# Patient Record
Sex: Female | Born: 1960 | Race: White | Hispanic: No | Smoking: Former smoker
Health system: Southern US, Community
[De-identification: ages and names within clinical notes are randomized; demographics above are authoritative.]

## PROBLEM LIST (undated history)

## (undated) DIAGNOSIS — K759 Inflammatory liver disease, unspecified: Secondary | ICD-10-CM

## (undated) DIAGNOSIS — R569 Unspecified convulsions: Secondary | ICD-10-CM

## (undated) DIAGNOSIS — F329 Major depressive disorder, single episode, unspecified: Secondary | ICD-10-CM

## (undated) DIAGNOSIS — K219 Gastro-esophageal reflux disease without esophagitis: Secondary | ICD-10-CM

## (undated) DIAGNOSIS — E049 Nontoxic goiter, unspecified: Secondary | ICD-10-CM

## (undated) DIAGNOSIS — E7800 Pure hypercholesterolemia, unspecified: Secondary | ICD-10-CM

## (undated) DIAGNOSIS — K649 Unspecified hemorrhoids: Secondary | ICD-10-CM

## (undated) DIAGNOSIS — M5136 Other intervertebral disc degeneration, lumbar region: Secondary | ICD-10-CM

## (undated) DIAGNOSIS — D369 Benign neoplasm, unspecified site: Secondary | ICD-10-CM

## (undated) DIAGNOSIS — B1920 Unspecified viral hepatitis C without hepatic coma: Secondary | ICD-10-CM

## (undated) DIAGNOSIS — I83893 Varicose veins of bilateral lower extremities with other complications: Secondary | ICD-10-CM

## (undated) DIAGNOSIS — R112 Nausea with vomiting, unspecified: Secondary | ICD-10-CM

## (undated) DIAGNOSIS — Z9889 Other specified postprocedural states: Secondary | ICD-10-CM

## (undated) DIAGNOSIS — R0681 Apnea, not elsewhere classified: Secondary | ICD-10-CM

## (undated) DIAGNOSIS — M51369 Other intervertebral disc degeneration, lumbar region without mention of lumbar back pain or lower extremity pain: Secondary | ICD-10-CM

## (undated) DIAGNOSIS — I10 Essential (primary) hypertension: Secondary | ICD-10-CM

## (undated) DIAGNOSIS — N390 Urinary tract infection, site not specified: Secondary | ICD-10-CM

## (undated) DIAGNOSIS — F32A Depression, unspecified: Secondary | ICD-10-CM

## (undated) DIAGNOSIS — F410 Panic disorder [episodic paroxysmal anxiety] without agoraphobia: Secondary | ICD-10-CM

## (undated) DIAGNOSIS — M25569 Pain in unspecified knee: Secondary | ICD-10-CM

## (undated) HISTORY — DX: Pain in unspecified knee: M25.569

## (undated) HISTORY — DX: Benign neoplasm, unspecified site: D36.9

## (undated) HISTORY — PX: BACK SURGERY: SHX140

## (undated) HISTORY — PX: HEMORRHOID BANDING: SHX5850

## (undated) HISTORY — DX: Varicose veins of bilateral lower extremities with other complications: I83.893

## (undated) HISTORY — DX: Unspecified hemorrhoids: K64.9

---
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
## 1986-07-24 HISTORY — PX: TUBAL LIGATION: SHX77

## 2002-07-24 DIAGNOSIS — B1920 Unspecified viral hepatitis C without hepatic coma: Secondary | ICD-10-CM

## 2002-07-24 HISTORY — DX: Unspecified viral hepatitis C without hepatic coma: B19.20

## 2003-07-25 HISTORY — PX: NECK SURGERY: SHX720

## 2006-10-25 ENCOUNTER — Encounter: Admission: RE | Admit: 2006-10-25 | Discharge: 2006-10-25 | Payer: Self-pay | Admitting: Neurosurgery

## 2007-01-23 ENCOUNTER — Ambulatory Visit (HOSPITAL_COMMUNITY): Admission: RE | Admit: 2007-01-23 | Discharge: 2007-01-23 | Payer: Self-pay | Admitting: Family Medicine

## 2007-02-25 ENCOUNTER — Ambulatory Visit (HOSPITAL_COMMUNITY): Admission: RE | Admit: 2007-02-25 | Discharge: 2007-02-25 | Payer: Self-pay | Admitting: Family Medicine

## 2007-03-07 ENCOUNTER — Encounter: Admission: RE | Admit: 2007-03-07 | Discharge: 2007-03-07 | Payer: Self-pay | Admitting: Sports Medicine

## 2007-03-21 ENCOUNTER — Encounter: Admission: RE | Admit: 2007-03-21 | Discharge: 2007-03-21 | Payer: Self-pay | Admitting: Sports Medicine

## 2007-04-04 ENCOUNTER — Encounter: Admission: RE | Admit: 2007-04-04 | Discharge: 2007-04-04 | Payer: Self-pay | Admitting: Sports Medicine

## 2007-04-25 ENCOUNTER — Encounter: Payer: Self-pay | Admitting: Sports Medicine

## 2007-05-25 ENCOUNTER — Encounter: Payer: Self-pay | Admitting: Sports Medicine

## 2008-07-21 ENCOUNTER — Ambulatory Visit (HOSPITAL_COMMUNITY): Admission: RE | Admit: 2008-07-21 | Discharge: 2008-07-21 | Payer: Self-pay | Admitting: Internal Medicine

## 2008-09-29 ENCOUNTER — Encounter (INDEPENDENT_AMBULATORY_CARE_PROVIDER_SITE_OTHER): Payer: Self-pay | Admitting: *Deleted

## 2008-09-29 LAB — CONVERTED CEMR LAB
AST: 16 units/L
Albumin: 4.3 g/dL
Alkaline Phosphatase: 83 units/L
CO2: 24 meq/L
Creatinine, Ser: 0.68 mg/dL
Glucose, Bld: 96 mg/dL
LDL Cholesterol: 145 mg/dL
Sodium: 141 meq/L
TSH: 0.503 microintl units/mL

## 2008-12-11 ENCOUNTER — Encounter: Admission: RE | Admit: 2008-12-11 | Discharge: 2008-12-11 | Payer: Self-pay | Admitting: Sports Medicine

## 2009-01-04 ENCOUNTER — Encounter: Admission: RE | Admit: 2009-01-04 | Discharge: 2009-01-04 | Payer: Self-pay | Admitting: Sports Medicine

## 2009-04-24 ENCOUNTER — Inpatient Hospital Stay (HOSPITAL_COMMUNITY): Admission: EM | Admit: 2009-04-24 | Discharge: 2009-04-26 | Payer: Self-pay | Admitting: Emergency Medicine

## 2009-04-24 ENCOUNTER — Encounter: Payer: Self-pay | Admitting: Cardiology

## 2009-04-25 ENCOUNTER — Encounter (INDEPENDENT_AMBULATORY_CARE_PROVIDER_SITE_OTHER): Payer: Self-pay | Admitting: Internal Medicine

## 2009-04-26 ENCOUNTER — Encounter (INDEPENDENT_AMBULATORY_CARE_PROVIDER_SITE_OTHER): Payer: Self-pay | Admitting: *Deleted

## 2009-04-26 LAB — CONVERTED CEMR LAB
BUN: 9 mg/dL
Calcium: 9.4 mg/dL
Chloride: 99 meq/L
Creatinine, Ser: 0.7 mg/dL
Glomerular Filtration Rate, Af Am: >60 mL/min/{1.73_m2}
HCT: 37.6 %
MCV: 94.2 fL
Platelets: 299 10*3/uL
Potassium: 3.9 meq/L

## 2009-04-28 ENCOUNTER — Telehealth (INDEPENDENT_AMBULATORY_CARE_PROVIDER_SITE_OTHER): Payer: Self-pay | Admitting: *Deleted

## 2009-04-30 ENCOUNTER — Encounter (INDEPENDENT_AMBULATORY_CARE_PROVIDER_SITE_OTHER): Payer: Self-pay | Admitting: Internal Medicine

## 2009-04-30 ENCOUNTER — Ambulatory Visit (HOSPITAL_COMMUNITY): Admission: RE | Admit: 2009-04-30 | Discharge: 2009-04-30 | Payer: Self-pay | Admitting: Internal Medicine

## 2009-04-30 ENCOUNTER — Ambulatory Visit: Payer: Self-pay | Admitting: Cardiovascular Disease

## 2009-05-04 DIAGNOSIS — F329 Major depressive disorder, single episode, unspecified: Secondary | ICD-10-CM

## 2009-05-04 DIAGNOSIS — R609 Edema, unspecified: Secondary | ICD-10-CM

## 2009-05-04 DIAGNOSIS — N390 Urinary tract infection, site not specified: Secondary | ICD-10-CM | POA: Insufficient documentation

## 2009-05-04 DIAGNOSIS — K219 Gastro-esophageal reflux disease without esophagitis: Secondary | ICD-10-CM

## 2009-05-04 DIAGNOSIS — B1710 Acute hepatitis C without hepatic coma: Secondary | ICD-10-CM

## 2009-05-07 ENCOUNTER — Encounter (INDEPENDENT_AMBULATORY_CARE_PROVIDER_SITE_OTHER): Payer: Self-pay | Admitting: *Deleted

## 2009-05-07 ENCOUNTER — Ambulatory Visit: Payer: Self-pay | Admitting: Cardiology

## 2009-05-07 DIAGNOSIS — M79609 Pain in unspecified limb: Secondary | ICD-10-CM

## 2009-05-07 DIAGNOSIS — F17200 Nicotine dependence, unspecified, uncomplicated: Secondary | ICD-10-CM

## 2009-05-10 ENCOUNTER — Ambulatory Visit (HOSPITAL_COMMUNITY): Admission: RE | Admit: 2009-05-10 | Discharge: 2009-05-10 | Payer: Self-pay | Admitting: Family Medicine

## 2009-05-17 ENCOUNTER — Ambulatory Visit (HOSPITAL_COMMUNITY): Admission: RE | Admit: 2009-05-17 | Discharge: 2009-05-17 | Payer: Self-pay | Admitting: Family Medicine

## 2009-05-24 ENCOUNTER — Ambulatory Visit (HOSPITAL_COMMUNITY): Admission: RE | Admit: 2009-05-24 | Discharge: 2009-05-24 | Payer: Self-pay | Admitting: Family Medicine

## 2009-07-24 HISTORY — PX: TOTAL SHOULDER ARTHROPLASTY: SHX126

## 2009-07-28 ENCOUNTER — Telehealth (INDEPENDENT_AMBULATORY_CARE_PROVIDER_SITE_OTHER): Payer: Self-pay | Admitting: *Deleted

## 2009-08-17 ENCOUNTER — Encounter (INDEPENDENT_AMBULATORY_CARE_PROVIDER_SITE_OTHER): Payer: Self-pay | Admitting: *Deleted

## 2009-09-10 ENCOUNTER — Encounter: Admission: RE | Admit: 2009-09-10 | Discharge: 2009-09-10 | Payer: Self-pay | Admitting: Neurosurgery

## 2009-09-27 ENCOUNTER — Encounter (INDEPENDENT_AMBULATORY_CARE_PROVIDER_SITE_OTHER): Payer: Self-pay | Admitting: *Deleted

## 2009-12-02 ENCOUNTER — Encounter: Admission: RE | Admit: 2009-12-02 | Discharge: 2009-12-02 | Payer: Self-pay | Admitting: Neurosurgery

## 2009-12-17 ENCOUNTER — Ambulatory Visit (HOSPITAL_COMMUNITY): Admission: RE | Admit: 2009-12-17 | Discharge: 2009-12-17 | Payer: Self-pay | Admitting: Neurosurgery

## 2010-02-17 ENCOUNTER — Ambulatory Visit (HOSPITAL_COMMUNITY): Payer: Self-pay | Admitting: Psychiatry

## 2010-03-09 ENCOUNTER — Encounter: Payer: Self-pay | Admitting: Neurosurgery

## 2010-03-16 ENCOUNTER — Ambulatory Visit (HOSPITAL_COMMUNITY): Payer: Self-pay | Admitting: Psychiatry

## 2010-03-17 ENCOUNTER — Ambulatory Visit (HOSPITAL_COMMUNITY): Payer: Self-pay | Admitting: Psychiatry

## 2010-03-24 ENCOUNTER — Encounter: Payer: Self-pay | Admitting: Neurosurgery

## 2010-04-23 ENCOUNTER — Encounter: Payer: Self-pay | Admitting: Neurosurgery

## 2010-05-24 ENCOUNTER — Encounter: Payer: Self-pay | Admitting: Neurosurgery

## 2010-07-13 ENCOUNTER — Emergency Department (HOSPITAL_COMMUNITY)
Admission: EM | Admit: 2010-07-13 | Discharge: 2010-07-13 | Payer: Self-pay | Source: Home / Self Care | Admitting: Emergency Medicine

## 2010-07-24 HISTORY — PX: FOOT SURGERY: SHX648

## 2010-08-15 ENCOUNTER — Encounter: Payer: Self-pay | Admitting: Family Medicine

## 2010-08-23 NOTE — Letter (Signed)
Summary: Appointment - Missed  Katelyn [name] [facility]  [address], [place] [address]   Phone: [phone]  Fax: [phone]     September 27, 2009 MRN: [id number]   [facility] [address] [place], [place]  [address]   Dear Katelyn [name],  Our records indicate you missed your appointment on       09-24-09  with Dr Dietrich Pates  is very important that we reach you to reschedule this appointment. We look forward to participating in your health care needs. Please contact us at the number listed above at your earliest convenience to reschedule this appointment.     Sincerely,    Glass blower/designer

## 2010-08-23 NOTE — Progress Notes (Signed)
Summary: FLUID GAIN  Phone Note Call from Patient Call back at Home Phone [phone]   Caller: PT Reason for Call: Talk to Nurse, Privacy/Consent Authorization Summary of Call: PT HAS FLUID GAIN SHE WEIGHTED 154 TODAY SHE HAS BEEN STILL TAKING FLUID PILLS AND DOUBLED UP ON THEM. SHE IS HAVING A HARD TIME BREATHING AT NIGHT CANNOT SLEEP. Initial call taken by: Faythe Ghee,  July 28, 2009 11:54 AM  Follow-up for Phone Call        pt also having issues with sleep apnea, encouraged pt to see her pcp for both issues and to call us if her pcp recommends follow up Follow-up by: Teressa Lower RN,  July 28, 2009 12:53 PM     Appended Document: FLUID GAIN Overdue for F/U.  [place] Bing, M.D.  Appended Document: FLUID GAIN    Phone Note Outgoing Call   Call placed by: Larita Fife Via LPN,  July 29, 2009 2:10 PM Details for Reason: overdue for OV Summary of Call: Left message for patient to call office.  Follow-up for Phone Call        Left message with patient's mother that pt. is due for a follow up appt. The mother stated she would give Mrs Craker the message.  Follow-up by: Larita Fife Via LPN,  August 02, 2009 12:02 PM

## 2010-08-23 NOTE — Miscellaneous (Signed)
Summary: LABS CMP,LIPIDS, 09/30/2008  Clinical Lists Changes  Observations: Added new observation of ALBUMIN: 4.3 g/dL (16/04/9603 54:09) Added new observation of PROTEIN, TOT: 6.9 g/dL (81/19/1478 29:56) Added new observation of SGPT (ALT): 18 units/L (09/29/2008 11:15) Added new observation of SGOT (AST): 16 units/L (09/29/2008 11:15) Added new observation of ALK PHOS: 83 units/L (09/29/2008 11:15) Added new observation of CREATININE: 0.68 mg/dL (21/30/8657 84:69) Added new observation of BUN: 12 mg/dL (62/95/2841 32:44) Added new observation of BG RANDOM: 96 mg/dL (07/26/7251 66:44) Added new observation of CO2 PLSM/SER: 24 meq/L (09/29/2008 11:15) Added new observation of CL SERUM: 106 meq/L (09/29/2008 11:15) Added new observation of K SERUM: 3.9 meq/L (09/29/2008 11:15) Added new observation of NA: 141 meq/L (09/29/2008 11:15) Added new observation of LDL: 145 mg/dL (03/47/4259 56:38) Added new observation of HDL: 55 mg/dL (75/64/3329 51:88) Added new observation of TSH: 0.503 microintl units/mL (09/29/2008 11:15)

## 2010-10-03 LAB — CBC
HCT: 36.5 % (ref 36.0–46.0)
MCH: 31.1 pg (ref 26.0–34.0)
MCHC: 34.2 g/dL (ref 30.0–36.0)
MCV: 90.8 fL (ref 78.0–100.0)
Platelets: 316 10*3/uL (ref 150–400)
RDW: 12.9 % (ref 11.5–15.5)
WBC: 5.7 10*3/uL (ref 4.0–10.5)

## 2010-10-03 LAB — DIFFERENTIAL
Basophils Absolute: 0 10*3/uL (ref 0.0–0.1)
Basophils Relative: 0 % (ref 0–1)
Eosinophils Absolute: 0.1 10*3/uL (ref 0.0–0.7)
Eosinophils Relative: 2 % (ref 0–5)
Lymphocytes Relative: 40 % (ref 12–46)
Monocytes Absolute: 0.3 10*3/uL (ref 0.1–1.0)

## 2010-10-03 LAB — BASIC METABOLIC PANEL
BUN: 10 mg/dL (ref 6–23)
Chloride: 106 mEq/L (ref 96–112)
Creatinine, Ser: 0.64 mg/dL (ref 0.4–1.2)
Glucose, Bld: 86 mg/dL (ref 70–99)
Potassium: 4.2 mEq/L (ref 3.5–5.1)

## 2010-10-03 LAB — HEPATIC FUNCTION PANEL
AST: 17 U/L (ref 0–37)
Bilirubin, Direct: 0.1 mg/dL (ref 0.0–0.3)
Indirect Bilirubin: 0.2 mg/dL — ABNORMAL LOW (ref 0.3–0.9)
Total Bilirubin: 0.3 mg/dL (ref 0.3–1.2)

## 2010-10-03 LAB — D-DIMER, QUANTITATIVE: D-Dimer, Quant: 0.33 ug/mL-FEU (ref 0.00–0.48)

## 2010-10-03 LAB — POCT CARDIAC MARKERS: Myoglobin, poc: 49 ng/mL (ref 12–200)

## 2010-10-10 LAB — SURGICAL PCR SCREEN: Staphylococcus aureus: NEGATIVE

## 2010-10-10 LAB — CBC
HCT: 41.7 % (ref 36.0–46.0)
Hemoglobin: 14.5 g/dL (ref 12.0–15.0)
MCV: 99.4 fL (ref 78.0–100.0)
RDW: 14 % (ref 11.5–15.5)
WBC: 8.3 10*3/uL (ref 4.0–10.5)

## 2010-10-10 LAB — HEPATIC FUNCTION PANEL
ALT: 20 U/L (ref 0–35)
AST: 17 U/L (ref 0–37)
Bilirubin, Direct: <0.1 mg/dL (ref 0.0–0.3)
Total Bilirubin: 0.4 mg/dL (ref 0.3–1.2)

## 2010-10-10 LAB — BASIC METABOLIC PANEL
Chloride: 104 mEq/L (ref 96–112)
GFR calc non Af Amer: >60 mL/min (ref >60)
Glucose, Bld: 83 mg/dL (ref 70–99)
Potassium: 4.4 mEq/L (ref 3.5–5.1)
Sodium: 140 mEq/L (ref 135–145)

## 2010-10-27 LAB — COMPREHENSIVE METABOLIC PANEL
ALT: 17 U/L (ref 0–35)
AST: 19 U/L (ref 0–37)
Albumin: 3.3 g/dL — ABNORMAL LOW (ref 3.5–5.2)
Albumin: 3.8 g/dL (ref 3.5–5.2)
Alkaline Phosphatase: 54 U/L (ref 39–117)
Alkaline Phosphatase: 60 U/L (ref 39–117)
BUN: 10 mg/dL (ref 6–23)
CO2: 28 mEq/L (ref 19–32)
Calcium: 8.9 mg/dL (ref 8.4–10.5)
Chloride: 105 mEq/L (ref 96–112)
Creatinine, Ser: 0.57 mg/dL (ref 0.4–1.2)
GFR calc Af Amer: >60 mL/min (ref >60)
GFR calc non Af Amer: >60 mL/min (ref >60)
Glucose, Bld: 79 mg/dL (ref 70–99)
Potassium: 3.6 mEq/L (ref 3.5–5.1)
Potassium: 3.7 mEq/L (ref 3.5–5.1)
Sodium: 139 mEq/L (ref 135–145)
Total Bilirubin: 0.4 mg/dL (ref 0.3–1.2)
Total Protein: 6.6 g/dL (ref 6.0–8.3)

## 2010-10-27 LAB — BASIC METABOLIC PANEL
BUN: 9 mg/dL (ref 6–23)
CO2: 33 mEq/L — ABNORMAL HIGH (ref 19–32)
Calcium: 9.4 mg/dL (ref 8.4–10.5)
Chloride: 99 mEq/L (ref 96–112)
Creatinine, Ser: 0.7 mg/dL (ref 0.4–1.2)

## 2010-10-27 LAB — CBC
HCT: 32.9 % — ABNORMAL LOW (ref 36.0–46.0)
Hemoglobin: 11.2 g/dL — ABNORMAL LOW (ref 12.0–15.0)
Hemoglobin: 12 g/dL (ref 12.0–15.0)
MCHC: 34.6 g/dL (ref 30.0–36.0)
MCV: 94.2 fL (ref 78.0–100.0)
MCV: 95.7 fL (ref 78.0–100.0)
Platelets: 281 10*3/uL (ref 150–400)
Platelets: 299 10*3/uL (ref 150–400)
RBC: 3.43 MIL/uL — ABNORMAL LOW (ref 3.87–5.11)
RDW: 12.2 % (ref 11.5–15.5)
RDW: 12.5 % (ref 11.5–15.5)
WBC: 4.1 10*3/uL (ref 4.0–10.5)

## 2010-10-27 LAB — DIFFERENTIAL
Basophils Absolute: 0 10*3/uL (ref 0.0–0.1)
Basophils Absolute: 0 10*3/uL (ref 0.0–0.1)
Basophils Relative: 0 % (ref 0–1)
Basophils Relative: 0 % (ref 0–1)
Basophils Relative: 0 % (ref 0–1)
Eosinophils Absolute: 0.1 10*3/uL (ref 0.0–0.7)
Eosinophils Absolute: 0.1 10*3/uL (ref 0.0–0.7)
Eosinophils Relative: 3 % (ref 0–5)
Eosinophils Relative: 3 % (ref 0–5)
Eosinophils Relative: 3 % (ref 0–5)
Lymphocytes Relative: 40 % (ref 12–46)
Lymphs Abs: 1.7 10*3/uL (ref 0.7–4.0)
Monocytes Absolute: 0.3 10*3/uL (ref 0.1–1.0)
Monocytes Absolute: 0.4 10*3/uL (ref 0.1–1.0)
Monocytes Relative: 8 % (ref 3–12)
Neutro Abs: 2 10*3/uL (ref 1.7–7.7)
Neutrophils Relative %: 54 % (ref 43–77)
Neutrophils Relative %: 57 % (ref 43–77)

## 2010-10-27 LAB — POCT CARDIAC MARKERS: Troponin i, poc: <0.05 ng/mL (ref 0.00–0.09)

## 2010-10-27 LAB — URINALYSIS, ROUTINE W REFLEX MICROSCOPIC
Glucose, UA: NEGATIVE mg/dL
Leukocytes, UA: NEGATIVE
pH: 7.5 (ref 5.0–8.0)

## 2010-10-27 LAB — BRAIN NATRIURETIC PEPTIDE: Pro B Natriuretic peptide (BNP): 241 pg/mL — ABNORMAL HIGH (ref 0.0–100.0)

## 2010-10-27 LAB — URINE MICROSCOPIC-ADD ON

## 2010-10-27 LAB — C-REACTIVE PROTEIN: CRP: 0.2 mg/dL — ABNORMAL LOW (ref <0.6)

## 2010-10-27 LAB — HEPATITIS C ANTIBODY: HCV Ab: REACTIVE — AB

## 2010-11-26 ENCOUNTER — Emergency Department (HOSPITAL_COMMUNITY): Payer: BC Managed Care – PPO

## 2010-11-26 ENCOUNTER — Emergency Department (HOSPITAL_COMMUNITY)
Admission: EM | Admit: 2010-11-26 | Discharge: 2010-11-26 | Disposition: A | Discharge status: Home / Self Care | Payer: BC Managed Care – PPO | Attending: Emergency Medicine | Admitting: Emergency Medicine

## 2010-11-26 DIAGNOSIS — Z79899 Other long term (current) drug therapy: Secondary | ICD-10-CM | POA: Insufficient documentation

## 2010-11-26 DIAGNOSIS — F329 Major depressive disorder, single episode, unspecified: Secondary | ICD-10-CM | POA: Insufficient documentation

## 2010-11-26 DIAGNOSIS — IMO0002 Reserved for concepts with insufficient information to code with codable children: Secondary | ICD-10-CM | POA: Insufficient documentation

## 2010-11-26 DIAGNOSIS — B1920 Unspecified viral hepatitis C without hepatic coma: Secondary | ICD-10-CM | POA: Insufficient documentation

## 2010-11-26 DIAGNOSIS — W010XXA Fall on same level from slipping, tripping and stumbling without subsequent striking against object, initial encounter: Secondary | ICD-10-CM | POA: Insufficient documentation

## 2010-11-26 DIAGNOSIS — Y9229 Other specified public building as the place of occurrence of the external cause: Secondary | ICD-10-CM | POA: Insufficient documentation

## 2010-11-26 DIAGNOSIS — H1130 Conjunctival hemorrhage, unspecified eye: Secondary | ICD-10-CM | POA: Insufficient documentation

## 2010-11-26 DIAGNOSIS — R079 Chest pain, unspecified: Secondary | ICD-10-CM | POA: Insufficient documentation

## 2010-11-26 DIAGNOSIS — S022XXA Fracture of nasal bones, initial encounter for closed fracture: Secondary | ICD-10-CM | POA: Insufficient documentation

## 2010-11-26 DIAGNOSIS — K219 Gastro-esophageal reflux disease without esophagitis: Secondary | ICD-10-CM | POA: Insufficient documentation

## 2010-11-26 DIAGNOSIS — F3289 Other specified depressive episodes: Secondary | ICD-10-CM | POA: Insufficient documentation

## 2010-12-01 ENCOUNTER — Emergency Department (HOSPITAL_COMMUNITY)
Admission: EM | Admit: 2010-12-01 | Discharge: 2010-12-01 | Disposition: A | Discharge status: Home / Self Care | Payer: BC Managed Care – PPO | Attending: Emergency Medicine | Admitting: Emergency Medicine

## 2010-12-01 DIAGNOSIS — R51 Headache: Secondary | ICD-10-CM | POA: Insufficient documentation

## 2011-04-26 ENCOUNTER — Encounter: Payer: Self-pay | Admitting: Emergency Medicine

## 2011-04-26 ENCOUNTER — Emergency Department (HOSPITAL_COMMUNITY): Payer: BC Managed Care – PPO

## 2011-04-26 ENCOUNTER — Emergency Department (HOSPITAL_COMMUNITY)
Admission: EM | Admit: 2011-04-26 | Discharge: 2011-04-26 | Disposition: A | Discharge status: Home / Self Care | Payer: BC Managed Care – PPO | Attending: Emergency Medicine | Admitting: Emergency Medicine

## 2011-04-26 DIAGNOSIS — S2239XA Fracture of one rib, unspecified side, initial encounter for closed fracture: Secondary | ICD-10-CM

## 2011-04-26 DIAGNOSIS — W108XXA Fall (on) (from) other stairs and steps, initial encounter: Secondary | ICD-10-CM | POA: Insufficient documentation

## 2011-04-26 DIAGNOSIS — F17200 Nicotine dependence, unspecified, uncomplicated: Secondary | ICD-10-CM | POA: Insufficient documentation

## 2011-04-26 DIAGNOSIS — R079 Chest pain, unspecified: Secondary | ICD-10-CM | POA: Insufficient documentation

## 2011-04-26 DIAGNOSIS — S2249XA Multiple fractures of ribs, unspecified side, initial encounter for closed fracture: Secondary | ICD-10-CM | POA: Insufficient documentation

## 2011-04-26 MED ORDER — HYDROMORPHONE HCL 1 MG/ML IJ SOLN
1.0000 mg | Freq: Once | INTRAMUSCULAR | Status: AC
  Administered 2011-04-26: 1 mg via INTRAVENOUS
  Filled 2011-04-26: qty 1

## 2011-04-26 MED ORDER — OXYCODONE-ACETAMINOPHEN 5-325 MG PO TABS: 1.0000 | ORAL_TABLET | Freq: Four times a day (QID) | ORAL | Status: AC | PRN

## 2011-04-26 MED ORDER — KETOROLAC TROMETHAMINE 30 MG/ML IJ SOLN
30.0000 mg | Freq: Once | INTRAMUSCULAR | Status: AC
  Administered 2011-04-26: 30 mg via INTRAVENOUS
  Filled 2011-04-26: qty 1

## 2011-04-26 MED ORDER — OXYCODONE-ACETAMINOPHEN 5-325 MG PO TABS
2.0000 | ORAL_TABLET | Freq: Once | ORAL | Status: AC
  Administered 2011-04-26: 2 via ORAL
  Filled 2011-04-26: qty 2

## 2011-04-26 MED ORDER — IBUPROFEN 800 MG PO TABS: 800.0000 mg | ORAL_TABLET | Freq: Three times a day (TID) | ORAL | Status: AC

## 2011-04-26 NOTE — ED Provider Notes (Signed)
History     CSN: [id number] Arrival date & time: 04/26/2011  2:06 PM  Chief Complaint  Patient presents with  . Fall  . Chest Pain    (Consider location/radiation/quality/duration/timing/severity/associated sxs/prior treatment) HPI Pt reports she slipped and fell down several steps at her home just prior to arrival injuring her R chest wall. Complaining of severe sharp pain, in R lower ribs, worse with movement and breathing. Denies any LOC. No neck or back pain.  History reviewed. No pertinent past medical history.  Past Surgical History  Procedure Date  . Total shoulder arthroplasty   . Foot surgery   . Neck surgery   . Tonsillectomy     No family history on file.  History  Substance Use Topics  . Smoking status: Current Everyday Smoker    Types: Cigarettes  . Smokeless tobacco: Not on file  . Alcohol Use: No    OB History    Grav Para Term Preterm Abortions TAB SAB Ect Mult Living                  Review of Systems All other systems reviewed and are negative except as noted in HPI.   Allergies  Sulfa antibiotics  Home Medications   Current Outpatient Rx  Name Route Sig Dispense Refill  . FLUOXETINE HCL 40 MG PO CAPS Oral Take 40 mg by mouth daily.        BP 169/80  Pulse 96  Temp(Src) 98.7 F (37.1 C) (Oral)  Resp 20  SpO2 100%  Physical Exam  Nursing note and vitals reviewed. Constitutional: She is oriented to person, place, and time. She appears well-developed and well-nourished.  HENT:  Head: Normocephalic and atraumatic.  Eyes: EOM are normal. Pupils are equal, round, and reactive to light.  Neck: Normal range of motion. Neck supple.  Cardiovascular: Normal rate, normal heart sounds and intact distal pulses.   Pulmonary/Chest: Effort normal and breath sounds normal. No respiratory distress. She has no wheezes. She exhibits tenderness (R lower chest wall tender).  Abdominal: Bowel sounds are normal. She exhibits no distension. There is no  tenderness.  Musculoskeletal: Normal range of motion. She exhibits no edema and no tenderness.  Neurological: She is alert and oriented to person, place, and time. She has normal strength. No cranial nerve deficit or sensory deficit.  Skin: Skin is warm and dry. No rash noted.  Psychiatric: She has a normal mood and affect.    ED Course  Procedures (including critical care time)  Labs Reviewed - No data to display Dg Ribs Unilateral W/chest Right  04/26/2011  *RADIOLOGY REPORT*  Clinical Data: Larey Seat.  Right posterior rib pain.  RIGHT RIBS AND CHEST - 3+ VIEW  Comparison: 11/26/2010  Findings: Heart size is normal.  Mediastinal shadows are normal. No pneumothorax or hemothorax.  There are fractures of the seventh and eighth ribs posterolaterally.  IMPRESSION: Fracture of the right the seventh and eighth ribs posterolaterally. No pneumothorax or hemothorax.  Original Report Authenticated By: Thomasenia Sales, M.D.     No diagnosis found.    MDM  Uncomplicated nondisplaced rib fractures. Discussed pain control, splinting and incentive spirometry with the patient. Will d/c with pain meds and PCP followup as needed.         Charles B. Bernette Mayers, MD 04/26/11 [phone]

## 2011-04-26 NOTE — ED Notes (Signed)
Pt c/o right rib pain after falling down steps.

## 2011-04-30 ENCOUNTER — Emergency Department (HOSPITAL_COMMUNITY): Payer: BC Managed Care – PPO

## 2011-04-30 ENCOUNTER — Emergency Department (HOSPITAL_COMMUNITY)
Admission: EM | Admit: 2011-04-30 | Discharge: 2011-04-30 | Disposition: A | Discharge status: Home / Self Care | Payer: BC Managed Care – PPO | Attending: Emergency Medicine | Admitting: Emergency Medicine

## 2011-04-30 ENCOUNTER — Encounter (HOSPITAL_COMMUNITY): Payer: Self-pay | Admitting: Emergency Medicine

## 2011-04-30 DIAGNOSIS — R059 Cough, unspecified: Secondary | ICD-10-CM | POA: Insufficient documentation

## 2011-04-30 DIAGNOSIS — R0602 Shortness of breath: Secondary | ICD-10-CM | POA: Insufficient documentation

## 2011-04-30 DIAGNOSIS — R109 Unspecified abdominal pain: Secondary | ICD-10-CM | POA: Insufficient documentation

## 2011-04-30 DIAGNOSIS — R05 Cough: Secondary | ICD-10-CM | POA: Insufficient documentation

## 2011-04-30 DIAGNOSIS — S2239XA Fracture of one rib, unspecified side, initial encounter for closed fracture: Secondary | ICD-10-CM

## 2011-04-30 DIAGNOSIS — R079 Chest pain, unspecified: Secondary | ICD-10-CM | POA: Insufficient documentation

## 2011-04-30 HISTORY — DX: Depression, unspecified: F32.A

## 2011-04-30 HISTORY — DX: Major depressive disorder, single episode, unspecified: F32.9

## 2011-04-30 HISTORY — DX: Panic disorder (episodic paroxysmal anxiety): F41.0

## 2011-04-30 LAB — COMPREHENSIVE METABOLIC PANEL
Albumin: 3.7 g/dL (ref 3.5–5.2)
BUN: 12 mg/dL (ref 6–23)
Calcium: 9.4 mg/dL (ref 8.4–10.5)
Creatinine, Ser: 0.47 mg/dL — ABNORMAL LOW (ref 0.50–1.10)
Total Protein: 7 g/dL (ref 6.0–8.3)

## 2011-04-30 LAB — DIFFERENTIAL
Basophils Relative: 0 % (ref 0–1)
Eosinophils Absolute: 0.1 10*3/uL (ref 0.0–0.7)
Monocytes Absolute: 0.4 10*3/uL (ref 0.1–1.0)
Monocytes Relative: 5 % (ref 3–12)

## 2011-04-30 LAB — CBC
HCT: 39.5 % (ref 36.0–46.0)
Hemoglobin: 13.4 g/dL (ref 12.0–15.0)
MCH: 31.8 pg (ref 26.0–34.0)
MCHC: 33.9 g/dL (ref 30.0–36.0)
RDW: 12.3 % (ref 11.5–15.5)

## 2011-04-30 MED ORDER — AZITHROMYCIN 250 MG PO TABS: 250.0000 mg | ORAL_TABLET | Freq: Every day | ORAL | Status: AC

## 2011-04-30 MED ORDER — IOHEXOL 300 MG/ML  SOLN
100.0000 mL | Freq: Once | INTRAMUSCULAR | Status: AC | PRN
  Administered 2011-04-30: 100 mL via INTRAVENOUS

## 2011-04-30 MED ORDER — AZITHROMYCIN 250 MG PO TABS
500.0000 mg | ORAL_TABLET | Freq: Once | ORAL | Status: AC
  Administered 2011-04-30: 500 mg via ORAL
  Filled 2011-04-30: qty 2

## 2011-04-30 MED ORDER — HYDROMORPHONE HCL 1 MG/ML IJ SOLN
1.0000 mg | Freq: Once | INTRAMUSCULAR | Status: AC
  Administered 2011-04-30: 1 mg via INTRAVENOUS
  Filled 2011-04-30: qty 1

## 2011-04-30 MED ORDER — HYDROMORPHONE HCL 2 MG PO TABS: 2.0000 mg | ORAL_TABLET | ORAL | Status: AC | PRN

## 2011-04-30 MED ORDER — ONDANSETRON HCL 4 MG/2ML IJ SOLN
4.0000 mg | Freq: Once | INTRAMUSCULAR | Status: AC
  Administered 2011-04-30: 4 mg via INTRAVENOUS
  Filled 2011-04-30: qty 2

## 2011-04-30 NOTE — ED Provider Notes (Signed)
History  Scribed for Dr. Aileen Pilot, the patient was seen in room APA11. The chart was scribed by Gilman Schmidt. The patients care was started at 1902. CSN: [id number] Arrival date & time: 04/30/2011  6:40 PM  Chief Complaint  Patient presents with  . Chest Pain   HPI Katelyn [name] is a 50 y.o. female who presents to the Emergency Department complaining of rib pain. Pt reports falling down stairs two days ago and being seen in the ED with a diagnosis of broken ribs. Pt c/o increased pain with productive cough. Additionally notes that she is unable to catch her breath, abdominal soreness, and LE swelling. Pt has taken Percocet with not relief. There are no other associated symptoms and no other alleviating or aggravating factors.   PAST MEDICAL HISTORY:  Past Medical History  Diagnosis Date  . Depression   . Panic attack      PAST SURGICAL HISTORY:  Past Surgical History  Procedure Date  . Total shoulder arthroplasty   . Foot surgery   . Neck surgery   . Tonsillectomy      MEDICATIONS:  Previous Medications   FLUOXETINE (PROZAC) 40 MG CAPSULE    Take 40 mg by mouth daily.     IBUPROFEN (ADVIL,MOTRIN) 800 MG TABLET    Take 1 tablet (800 mg total) by mouth 3 (three) times daily.   OXYCODONE-ACETAMINOPHEN (PERCOCET) 5-325 MG PER TABLET    Take 1-2 tablets by mouth every 6 (six) hours as needed for pain.     ALLERGIES:  Allergies as of 04/30/2011 - Review Complete 04/30/2011  Allergen Reaction Noted  . Sulfa antibiotics Nausea And Vomiting and Rash 04/26/2011     FAMILY HISTORY:   History reviewed. No pertinent family history.   SOCIAL HISTORY: History  Substance Use Topics  . Smoking status: Current Everyday Smoker -- 0.5 packs/day for 35 years    Types: Cigarettes  . Smokeless tobacco: Never Used  . Alcohol Use: No      Review of Systems  Respiratory: Positive for cough and shortness of breath.   Gastrointestinal: Positive for abdominal pain.  Musculoskeletal:   Rib Pain  All other systems reviewed and are negative.    Allergies  Sulfa antibiotics  Home Medications   Current Outpatient Rx  Name Route Sig Dispense Refill  . FLUOXETINE HCL 40 MG PO CAPS Oral Take 40 mg by mouth daily.      . IBUPROFEN 800 MG PO TABS Oral Take 1 tablet (800 mg total) by mouth 3 (three) times daily. 21 tablet 0  . OXYCODONE-ACETAMINOPHEN 5-325 MG PO TABS Oral Take 1-2 tablets by mouth every 6 (six) hours as needed for pain. 30 tablet 0    BP 153/76  Pulse 86  Temp(Src) 97.9 F (36.6 C) (Oral)  Resp 20  Ht 5\' 4"  (1.626 m)  Wt 155 lb (70.308 kg)  BMI 26.61 kg/m2  SpO2 97%  Physical Exam  Constitutional: She is oriented to person, place, and time. She appears well-developed and well-nourished.  Non-toxic appearance. She does not have a sickly appearance.  HENT:  Head: Normocephalic and atraumatic.  Eyes: Conjunctivae, EOM and lids are normal. Pupils are equal, round, and reactive to light. No scleral icterus.  Neck: Trachea normal and normal range of motion. Neck supple.  Cardiovascular: Regular rhythm and normal heart sounds.   Pulmonary/Chest: Effort normal and breath sounds normal.       Left lateral chest tenderness  Abdominal: Soft. Normal appearance. There is tenderness  in the right upper quadrant. There is no rebound, no guarding and no CVA tenderness.  Musculoskeletal: Normal range of motion.  Neurological: She is alert and oriented to person, place, and time. She has normal strength.  Skin: Skin is warm, dry and intact. No rash noted.    ED Course  Procedures  OTHER DATA REVIEWED: Nursing notes, vital signs, and past medical records reviewed.  DIAGNOSTIC STUDIES: Oxygen Saturation is 97% on room air, normal by my interpretation.    LABS:  Results for orders placed during the hospital encounter of 04/30/11  CBC      Component Value Range   WBC 7.9  4.0 - 10.5 (K/uL)   RBC 4.21  3.87 - 5.11 (MIL/uL)   Hemoglobin 13.4  12.0 - 15.0  (g/dL)   HCT 57.8  46.9 - 62.9 (%)   MCV 93.8  78.0 - 100.0 (fL)   MCH 31.8  26.0 - 34.0 (pg)   MCHC 33.9  30.0 - 36.0 (g/dL)   RDW 52.8  41.3 - 24.4 (%)   Platelets 347  150 - 400 (K/uL)  DIFFERENTIAL      Component Value Range   Neutrophils Relative 67  43 - 77 (%)   Neutro Abs 5.3  1.7 - 7.7 (K/uL)   Lymphocytes Relative 26  12 - 46 (%)   Lymphs Abs 2.1  0.7 - 4.0 (K/uL)   Monocytes Relative 5  3 - 12 (%)   Monocytes Absolute 0.4  0.1 - 1.0 (K/uL)   Eosinophils Relative 1  0 - 5 (%)   Eosinophils Absolute 0.1  0.0 - 0.7 (K/uL)   Basophils Relative 0  0 - 1 (%)   Basophils Absolute 0.0  0.0 - 0.1 (K/uL)  COMPREHENSIVE METABOLIC PANEL      Component Value Range   Sodium 134 (*) 135 - 145 (mEq/L)   Potassium 3.6  3.5 - 5.1 (mEq/L)   Chloride 97  96 - 112 (mEq/L)   CO2 29  19 - 32 (mEq/L)   Glucose, Bld 101 (*) 70 - 99 (mg/dL)   BUN 12  6 - 23 (mg/dL)   Creatinine, Ser 0.10 (*) 0.50 - 1.10 (mg/dL)   Calcium 9.4  8.4 - 27.2 (mg/dL)   Total Protein 7.0  6.0 - 8.3 (g/dL)   Albumin 3.7  3.5 - 5.2 (g/dL)   AST 17  0 - 37 (U/L)   ALT 20  0 - 35 (U/L)   Alkaline Phosphatase 114  39 - 117 (U/L)   Total Bilirubin 0.2 (*) 0.3 - 1.2 (mg/dL)   GFR calc non Af Amer >90  >90 (mL/min)   GFR calc Af Amer >90  >90 (mL/min)     RADIOLOGY:   CT Abdomen Pelvis IMPRESSION: No acute post-traumatic changes demonstrated in the abdomen or pelvis. Original Report Authenticated By: Marlon Pel, M.D.  CT Chest IMPRESSION: Multiple right rib fractures. Small right pleural fluid collection could represent effusion or hematoma. Infiltration or atelectasis in the lung bases. Focal right pulmonary contusion is not excluded. No pneumothorax. Thoracic aorta appears intact. Original Report Authenticated By: Marlon Pel, M.D.     ED COURSE / COORDINATION OF CARE: 1902: - Patient evaluated by ED physician, Dilaudid, Zofran, CT Ab, CT Chest, and labs ordered   MDM: rib fx  bronchitis  IMPRESSION: Diagnoses that have been ruled out:  Diagnoses that are still under consideration:  Final diagnoses:    PLAN:  Home The patient is to return  the emergency department if there is any worsening of symptoms. I have reviewed the discharge instructions with the patient and family.  CONDITION ON DISCHARGE: Stable  MEDICATIONS GIVEN IN THE E.D.  Medications  HYDROmorphone (DILAUDID) injection 1 mg (not administered)  ondansetron (ZOFRAN) injection 4 mg (not administered)    DISCHARGE MEDICATIONS: New Prescriptions   No medications on file    SCRIBE ATTESTATION: The chart was scribed for me under my direct supervision.  I personally performed the history, physical, and medical decision making and all procedures in the evaluation of this patient.Benny Lennert, MD 04/30/11 2110

## 2011-04-30 NOTE — ED Notes (Signed)
Pt a/ox4. Resp even and unlabored. NAD at this time. D/C instructions reviewed with pt. Pt verbalized understanding. Pt to POV with mother. Pt ambulated to POV with steady gate. Mother to transport home.

## 2011-04-30 NOTE — ED Notes (Signed)
Patient c/o rib pain. Patient reports falling down stairs Friday and being seen here and diagnosed with broken ribs. Patient now reports increased pain with productive pain. Patient taking percocet with no relief.

## 2012-07-04 ENCOUNTER — Other Ambulatory Visit (HOSPITAL_COMMUNITY): Payer: Self-pay | Admitting: Internal Medicine

## 2012-07-04 DIAGNOSIS — Z01419 Encounter for gynecological examination (general) (routine) without abnormal findings: Secondary | ICD-10-CM

## 2012-07-08 ENCOUNTER — Ambulatory Visit (HOSPITAL_COMMUNITY): Payer: BC Managed Care – PPO

## 2012-07-24 HISTORY — PX: TRANSTHORACIC ECHOCARDIOGRAM: SHX275

## 2012-07-25 ENCOUNTER — Telehealth: Payer: Self-pay

## 2012-07-25 NOTE — Telephone Encounter (Signed)
LM for pt to call

## 2012-07-26 ENCOUNTER — Other Ambulatory Visit (HOSPITAL_COMMUNITY): Payer: Self-pay | Admitting: Internal Medicine

## 2012-07-26 DIAGNOSIS — I10 Essential (primary) hypertension: Secondary | ICD-10-CM

## 2012-07-26 DIAGNOSIS — R0600 Dyspnea, unspecified: Secondary | ICD-10-CM

## 2012-07-26 DIAGNOSIS — I519 Heart disease, unspecified: Secondary | ICD-10-CM

## 2012-07-29 NOTE — Telephone Encounter (Signed)
Pt left VM Friday afternoon. I returned her call this Am ( home and cell ) LMOM for a return call.

## 2012-08-12 NOTE — Telephone Encounter (Signed)
I returned pt's call. Many rings and no answer.

## 2012-08-13 ENCOUNTER — Ambulatory Visit (HOSPITAL_COMMUNITY)
Admission: RE | Admit: 2012-08-13 | Discharge: 2012-08-13 | Disposition: A | Discharge status: Home / Self Care | Payer: BC Managed Care – PPO | Source: Ambulatory Visit | Attending: Internal Medicine | Admitting: Internal Medicine

## 2012-08-13 DIAGNOSIS — F17200 Nicotine dependence, unspecified, uncomplicated: Secondary | ICD-10-CM | POA: Insufficient documentation

## 2012-08-13 DIAGNOSIS — I10 Essential (primary) hypertension: Secondary | ICD-10-CM | POA: Insufficient documentation

## 2012-08-13 DIAGNOSIS — R0600 Dyspnea, unspecified: Secondary | ICD-10-CM

## 2012-08-13 DIAGNOSIS — R0602 Shortness of breath: Secondary | ICD-10-CM | POA: Insufficient documentation

## 2012-08-13 DIAGNOSIS — I519 Heart disease, unspecified: Secondary | ICD-10-CM

## 2012-08-13 NOTE — Progress Notes (Signed)
[facility]   2D echo completed 08/13/2012.   Cindy Kailana Benninger, RDCS   

## 2012-08-13 NOTE — Telephone Encounter (Signed)
LM with pt's mom for a return call.

## 2012-08-14 ENCOUNTER — Telehealth: Payer: Self-pay

## 2012-08-14 NOTE — Telephone Encounter (Signed)
Pt called in reference to scheduling her colonoscopy. However, she is in the process of being checked out by cardiology and has an appt for a stress test on 08/26/2012. I told her it is probably best to schedule after the cardiology completes their assessment. I have her on my call list in Feb if I do not hear from her. ( she said this will be her first colonoscopy. She is not having any GI problems and no family hx of colon cancer.).

## 2012-08-15 NOTE — Telephone Encounter (Signed)
Agree -  get colonoscopy done after stress test

## 2012-08-16 NOTE — Telephone Encounter (Signed)
See phone note of 08/14/2012.

## 2012-08-26 HISTORY — PX: OTHER SURGICAL HISTORY: SHX169

## 2012-08-29 ENCOUNTER — Ambulatory Visit (HOSPITAL_COMMUNITY)
Admission: RE | Admit: 2012-08-29 | Discharge: 2012-08-29 | Disposition: A | Discharge status: Home / Self Care | Payer: BC Managed Care – PPO | Source: Ambulatory Visit | Attending: Internal Medicine | Admitting: Internal Medicine

## 2012-08-29 DIAGNOSIS — Z1231 Encounter for screening mammogram for malignant neoplasm of breast: Secondary | ICD-10-CM | POA: Insufficient documentation

## 2012-08-29 DIAGNOSIS — Z01419 Encounter for gynecological examination (general) (routine) without abnormal findings: Secondary | ICD-10-CM

## 2012-09-17 NOTE — Telephone Encounter (Signed)
Called pt. She has completed cardiology work up. She is going to check with them and make sure it is OK to schedule colonoscopy and she will call me back.

## 2012-09-25 ENCOUNTER — Other Ambulatory Visit: Payer: Self-pay

## 2012-09-25 ENCOUNTER — Telehealth: Payer: Self-pay

## 2012-09-25 DIAGNOSIS — Z1211 Encounter for screening for malignant neoplasm of colon: Secondary | ICD-10-CM

## 2012-09-25 NOTE — Telephone Encounter (Signed)
LM with pt's mom for a return call.  

## 2012-09-25 NOTE — Telephone Encounter (Signed)
Need letter from cardiologist before proceeding.

## 2012-09-25 NOTE — Telephone Encounter (Signed)
Pt returned call. She will go by cardiologist's office tomorrow and see if they can give [place] a letter, and she will bring it by the office.

## 2012-09-25 NOTE — Telephone Encounter (Signed)
Gastroenterology Pre-Procedure Form      Pt was referred earlier/ had to see cardiology/ York Spaniel she has been cleared per cardiology  Request Date: 09/25/2012     Requesting Physician: Dr. Regino Schultze     PATIENT INFORMATION:  Katelyn [name] is a 52 y.o., female (DOB=[birth year]).  PROCEDURE: Procedure(s) requested: colonoscopy Procedure Reason: screening for colon cancer  PATIENT REVIEW QUESTIONS: The patient reports the following:   1. Diabetes Melitis: no 2. Joint replacements in the past 12 months: no 3. Major health problems in the past 3 months: no 4. Has an artificial valve or MVP:no 5. Has been advised in past to take antibiotics in advance of a procedure like teeth cleaning: no}    MEDICATIONS & ALLERGIES:    Patient reports the following regarding taking any blood thinners:   Plavix? no Aspirin?no Coumadin?  no  Patient confirms/reports the following medications:  Current Outpatient Prescriptions  Medication Sig Dispense Refill  . FLUoxetine (PROZAC) 40 MG capsule Take 40 mg by mouth daily.        . furosemide (LASIX) 20 MG tablet Take 20 mg by mouth daily.      . meloxicam (MOBIC) 15 MG tablet Take 15 mg by mouth daily.      Marland Kitchen omeprazole (PRILOSEC) 20 MG capsule Take 20 mg by mouth daily.      . potassium chloride SA (K-DUR,KLOR-CON) 20 MEQ tablet Take 20 mEq by mouth daily.      . simvastatin (ZOCOR) 20 MG tablet Take 20 mg by mouth every evening.      . valsartan-hydrochlorothiazide (DIOVAN-HCT) 160-12.5 MG per tablet Take 1 tablet by mouth daily.       No current facility-administered medications for this visit.    Patient confirms/reports the following allergies:  Allergies  Allergen Reactions  . Sulfa Antibiotics Nausea And Vomiting and Rash    Patient is appropriate to schedule for requested procedure(s): yes  AUTHORIZATION INFORMATION Primary Insurance:  ID #:   Group #:  Pre-Cert / Auth required:  Pre-Cert / Auth #:   Secondary Insurance:   ID #:   Group #:  Pre-Cert / Auth required:  Pre-Cert / Auth #:   No orders of the defined types were placed in this encounter.    SCHEDULE INFORMATION: Procedure has been scheduled as follows:  Date: 10/14/2012    Time: 9:30 AM  Location: [facility] Short Stay  This Gastroenterology Pre-Precedure Form is being routed to the following provider(s) for review: R. Roetta Sessions, MD

## 2012-09-30 MED ORDER — PEG-KCL-NACL-NASULF-NA ASC-C 100 G PO SOLR: 1.0000 | ORAL | Status: DC

## 2012-09-30 NOTE — Telephone Encounter (Signed)
Rx sent to [facility] in [place] and instructions mailed to pt.

## 2012-09-30 NOTE — Telephone Encounter (Signed)
Received letter from PCP confirming clearance for colonoscopy. Letter states her most recent office visit with cardiology was negative for acute findings, normal ECHO.  Appropriate to schedule.

## 2012-09-30 NOTE — Telephone Encounter (Signed)
Sent to Lorenza Burton, NP in error. Routing to Gerrit Halls, NP to review. Letter to her.

## 2012-09-30 NOTE — Telephone Encounter (Signed)
Pt brought by a letter from PCP. Given to Lorenza Burton, NP to review.

## 2012-10-01 ENCOUNTER — Encounter (HOSPITAL_COMMUNITY): Payer: Self-pay | Admitting: Pharmacy Technician

## 2012-10-09 ENCOUNTER — Other Ambulatory Visit (HOSPITAL_COMMUNITY): Payer: Self-pay | Admitting: Internal Medicine

## 2012-10-09 DIAGNOSIS — M5430 Sciatica, unspecified side: Secondary | ICD-10-CM

## 2012-10-11 ENCOUNTER — Ambulatory Visit (HOSPITAL_COMMUNITY)
Admission: RE | Admit: 2012-10-11 | Discharge: 2012-10-11 | Disposition: A | Discharge status: Home / Self Care | Payer: BC Managed Care – PPO | Source: Ambulatory Visit | Attending: Internal Medicine | Admitting: Internal Medicine

## 2012-10-11 ENCOUNTER — Encounter (HOSPITAL_COMMUNITY): Payer: Self-pay | Admitting: Pharmacy Technician

## 2012-10-11 DIAGNOSIS — M5137 Other intervertebral disc degeneration, lumbosacral region: Secondary | ICD-10-CM | POA: Insufficient documentation

## 2012-10-11 DIAGNOSIS — M51379 Other intervertebral disc degeneration, lumbosacral region without mention of lumbar back pain or lower extremity pain: Secondary | ICD-10-CM | POA: Insufficient documentation

## 2012-10-11 DIAGNOSIS — M545 Low back pain, unspecified: Secondary | ICD-10-CM | POA: Insufficient documentation

## 2012-10-11 DIAGNOSIS — M5430 Sciatica, unspecified side: Secondary | ICD-10-CM

## 2012-10-11 DIAGNOSIS — M25559 Pain in unspecified hip: Secondary | ICD-10-CM | POA: Insufficient documentation

## 2012-10-11 MED ORDER — SODIUM CHLORIDE 0.45 % IV SOLN: INTRAVENOUS | Status: DC

## 2012-10-14 ENCOUNTER — Encounter (HOSPITAL_COMMUNITY): Payer: Self-pay | Admitting: *Deleted

## 2012-10-14 ENCOUNTER — Encounter (HOSPITAL_COMMUNITY)
Admission: RE | Disposition: A | Discharge status: Home / Self Care | Payer: Self-pay | Source: Ambulatory Visit | Attending: Internal Medicine

## 2012-10-14 ENCOUNTER — Ambulatory Visit (HOSPITAL_COMMUNITY)
Admission: RE | Admit: 2012-10-14 | Discharge: 2012-10-14 | Disposition: A | Discharge status: Home / Self Care | Payer: BC Managed Care – PPO | Source: Ambulatory Visit | Attending: Internal Medicine | Admitting: Internal Medicine

## 2012-10-14 DIAGNOSIS — D126 Benign neoplasm of colon, unspecified: Secondary | ICD-10-CM

## 2012-10-14 DIAGNOSIS — E7800 Pure hypercholesterolemia, unspecified: Secondary | ICD-10-CM | POA: Insufficient documentation

## 2012-10-14 DIAGNOSIS — F3289 Other specified depressive episodes: Secondary | ICD-10-CM | POA: Insufficient documentation

## 2012-10-14 DIAGNOSIS — Z79899 Other long term (current) drug therapy: Secondary | ICD-10-CM | POA: Insufficient documentation

## 2012-10-14 DIAGNOSIS — I10 Essential (primary) hypertension: Secondary | ICD-10-CM | POA: Insufficient documentation

## 2012-10-14 DIAGNOSIS — Z882 Allergy status to sulfonamides status: Secondary | ICD-10-CM | POA: Insufficient documentation

## 2012-10-14 DIAGNOSIS — F329 Major depressive disorder, single episode, unspecified: Secondary | ICD-10-CM | POA: Insufficient documentation

## 2012-10-14 DIAGNOSIS — D369 Benign neoplasm, unspecified site: Secondary | ICD-10-CM

## 2012-10-14 DIAGNOSIS — F410 Panic disorder [episodic paroxysmal anxiety] without agoraphobia: Secondary | ICD-10-CM | POA: Insufficient documentation

## 2012-10-14 DIAGNOSIS — Z1211 Encounter for screening for malignant neoplasm of colon: Secondary | ICD-10-CM

## 2012-10-14 DIAGNOSIS — F17200 Nicotine dependence, unspecified, uncomplicated: Secondary | ICD-10-CM | POA: Insufficient documentation

## 2012-10-14 DIAGNOSIS — E049 Nontoxic goiter, unspecified: Secondary | ICD-10-CM | POA: Insufficient documentation

## 2012-10-14 HISTORY — DX: Essential (primary) hypertension: I10

## 2012-10-14 HISTORY — DX: Benign neoplasm, unspecified site: D36.9

## 2012-10-14 HISTORY — DX: Nontoxic goiter, unspecified: E04.9

## 2012-10-14 HISTORY — DX: Pure hypercholesterolemia, unspecified: E78.00

## 2012-10-14 HISTORY — PX: COLONOSCOPY: SHX5424

## 2012-10-14 SURGERY — COLONOSCOPY
Anesthesia: Moderate Sedation

## 2012-10-14 MED ORDER — STERILE WATER FOR IRRIGATION IR SOLN
Status: DC | PRN
  Administered 2012-10-14: 09:00:00

## 2012-10-14 MED ORDER — ONDANSETRON HCL 4 MG/2ML IJ SOLN
INTRAMUSCULAR | Status: DC | PRN
  Administered 2012-10-14: 4 mg via INTRAVENOUS

## 2012-10-14 MED ORDER — SODIUM CHLORIDE 0.9 % IV SOLN
INTRAVENOUS | Status: DC
  Administered 2012-10-14: 09:00:00 via INTRAVENOUS

## 2012-10-14 MED ORDER — MEPERIDINE HCL 100 MG/ML IJ SOLN
INTRAMUSCULAR | Status: AC
  Filled 2012-10-14: qty 1

## 2012-10-14 MED ORDER — ONDANSETRON HCL 4 MG/2ML IJ SOLN
INTRAMUSCULAR | Status: AC
  Filled 2012-10-14: qty 2

## 2012-10-14 MED ORDER — MIDAZOLAM HCL 5 MG/5ML IJ SOLN
INTRAMUSCULAR | Status: DC | PRN
  Administered 2012-10-14: 2 mg via INTRAVENOUS
  Administered 2012-10-14: 1 mg via INTRAVENOUS
  Administered 2012-10-14: 2 mg via INTRAVENOUS

## 2012-10-14 MED ORDER — MEPERIDINE HCL 100 MG/ML IJ SOLN
INTRAMUSCULAR | Status: DC | PRN
  Administered 2012-10-14 (×2): 50 mg via INTRAVENOUS

## 2012-10-14 MED ORDER — MIDAZOLAM HCL 5 MG/5ML IJ SOLN
INTRAMUSCULAR | Status: AC
  Filled 2012-10-14: qty 10

## 2012-10-14 NOTE — H&P (Signed)
Primary Care Physician:  Cassell Smiles., MD Primary Gastroenterologist:  Dr. Jena Gauss  Pre-Procedure History & Physical: HPI:  Katelyn [name] is a 52 y.o. female is here for a screening colonoscopy.  No bowel symptoms. No family history of colon cancer. No prior colonoscopy.  Past Medical History  Diagnosis Date  . Depression   . Panic attack   . Hypertension   . Hypercholesteremia   . Goiter     Past Surgical History  Procedure Laterality Date  . Total shoulder arthroplasty    . Foot surgery    . Neck surgery    . Tonsillectomy      Prior to Admission medications   Medication Sig Start Date End Date Taking? Authorizing Provider  FLUoxetine (PROZAC) 40 MG capsule Take 40 mg by mouth daily.     Yes Historical Provider, MD  furosemide (LASIX) 20 MG tablet Take 20 mg by mouth daily.   Yes Historical Provider, MD  HYDROcodone-acetaminophen (NORCO) 7.5-325 MG per tablet Take 1 tablet by mouth every 6 (six) hours.   Yes Historical Provider, MD  meloxicam (MOBIC) 15 MG tablet Take 15 mg by mouth daily.   Yes Historical Provider, MD  omeprazole (PRILOSEC) 20 MG capsule Take 20 mg by mouth daily.   Yes Historical Provider, MD  peg 3350 powder (MOVIPREP) 100 G SOLR Take 1 kit (100 g total) by mouth as directed. 09/30/12  Yes Corbin Ade, MD  potassium chloride SA (K-DUR,KLOR-CON) 20 MEQ tablet Take 20 mEq by mouth daily.   Yes Historical Provider, MD  simvastatin (ZOCOR) 20 MG tablet Take 20 mg by mouth every evening.   Yes Historical Provider, MD  valsartan-hydrochlorothiazide (DIOVAN-HCT) 160-12.5 MG per tablet Take 1 tablet by mouth daily.   Yes Historical Provider, MD    Allergies as of 09/25/2012 - Review Complete 09/25/2012  Allergen Reaction Noted  . Sulfa antibiotics Nausea And Vomiting and Rash 04/26/2011    Family History  Problem Relation Age of Onset  . Colon cancer Neg Hx     History   Social History  . Marital Status: Divorced    Spouse Name: N/A   Number of Children: N/A  . Years of Education: N/A   Occupational History  . Not on file.   Social History Main Topics  . Smoking status: Current Every Day Smoker -- 0.50 packs/day for 35 years    Types: Cigarettes  . Smokeless tobacco: Never Used  . Alcohol Use: No  . Drug Use: No  . Sexually Active: No   Other Topics Concern  . Not on file   Social History Narrative  . No narrative on file    Review of Systems: See HPI, otherwise negative ROS  Physical Exam: BP 169/84  Pulse 76  Temp(Src) 98 F (36.7 C) (Oral)  Resp 20  Ht 5\' 4"  (1.626 m)  Wt 180 lb (81.647 kg)  BMI 30.88 kg/m2  SpO2 98% General:   Alert,  Well-developed, well-nourished, pleasant and cooperative in NAD Head:  Normocephalic and atraumatic. Eyes:  Sclera clear, no icterus.   Conjunctiva pink. Ears:  Normal auditory acuity. Nose:  No deformity, discharge,  or lesions. Mouth:  No deformity or lesions, dentition normal. Neck:  Supple; no masses or thyromegaly. Lungs:  Clear throughout to auscultation.   No wheezes, crackles, or rhonchi. No acute distress. Heart:  Regular rate and rhythm; no murmurs, clicks, rubs,  or gallops. Abdomen:  Nondistended. Positive bowel sounds. Soft and nontender without appreciable mass organomegaly  Msk:  Symmetrical without gross deformities. Normal posture. Pulses:  Normal pulses noted. Extremities:  Without clubbing or edema. Neurologic:  Alert and  oriented x4;  grossly normal neurologically. Skin:  Intact without significant lesions or rashes. Cervical Nodes:  No significant cervical adenopathy. Psych:  Alert and cooperative. Normal mood and affect.  Impression/Plan: Katelyn [name] is now here to undergo a screening colonoscopy. First ever average risk screening examination   The risks, benefits, limitations, imponderables and alternatives regarding colonoscopy have been reviewed with the patient. Questions have been answered. All parties agreeable.

## 2012-10-14 NOTE — Op Note (Signed)
[facility] [address] [place], [address]   COLONOSCOPY PROCEDURE REPORT  PATIENT: [name], Katelyn  MR#:         [id number] BIRTHDATE: Oct 25, 1960 , 52  yrs. old GENDER: Female ENDOSCOPIST: R.  Roetta Sessions, MD FACP FACG REFERRED BY:  Artis Delay, M.D. PROCEDURE DATE:  10/14/2012 PROCEDURE:    Colonoscopy biopsy and snare polypectomy  INDICATIONS: First ever average risk screening colonoscopy  INFORMED CONSENT:  The risks, benefits, alternatives and imponderables including but not limited to bleeding, perforation as well as the possibility of a missed lesion have been reviewed.  The potential for biopsy, lesion removal, etc. have also been discussed.  Questions have been answered.  All parties agreeable. Please see the history and physical in the medical record for more information.  MEDICATIONS: Versed 5 mg IV and Demerol 100 mg IV in divided doses. Zofran 4 mg  DESCRIPTION OF PROCEDURE:  After a digital rectal exam was performed, the EC-[id number] ([id number])  colonoscope was advanced from the anus through the rectum and colon to the area of the cecum, ileocecal valve and appendiceal orifice.  The cecum was deeply intubated.  These structures were well-seen and photographed for the record.  From the level of the cecum and ileocecal valve, the scope was slowly and cautiously withdrawn.  The mucosal surfaces were carefully surveyed utilizing scope tip deflection to facilitate fold flattening as needed.  The scope was pulled down into the rectum where a thorough examination including retroflexion was performed.    FINDINGS:  Adequate preparation. Normal rectum. (3) polyps in the cecum-the largest being approximately 5 mm in diameter. The remainder of the colonic mucosa appeared normal.  THERAPEUTIC / DIAGNOSTIC MANEUVERS PERFORMED:  The above-mentioned polyps were cold snared and biopsied/removed.  COMPLICATIONS: None  CECAL WITHDRAWAL TIME:  8  minutes  IMPRESSION:  Colonic polyps-removed as described above  RECOMMENDATIONS: Followup on pathology   _______________________________ eSigned:  R. Roetta Sessions, MD FACP [facility] 10/14/2012 9:51 AM   CC:

## 2012-10-16 ENCOUNTER — Encounter (HOSPITAL_COMMUNITY): Payer: Self-pay | Admitting: Internal Medicine

## 2012-10-16 ENCOUNTER — Encounter: Payer: Self-pay | Admitting: Internal Medicine

## 2012-12-09 ENCOUNTER — Other Ambulatory Visit: Payer: Self-pay | Admitting: Neurosurgery

## 2012-12-09 DIAGNOSIS — M549 Dorsalgia, unspecified: Secondary | ICD-10-CM

## 2012-12-09 DIAGNOSIS — M5410 Radiculopathy, site unspecified: Secondary | ICD-10-CM

## 2012-12-19 ENCOUNTER — Ambulatory Visit
Admission: RE | Admit: 2012-12-19 | Discharge: 2012-12-19 | Disposition: A | Discharge status: Home / Self Care | Payer: Medicaid Other | Source: Ambulatory Visit | Attending: Neurosurgery | Admitting: Neurosurgery

## 2012-12-19 VITALS — BP 127/82 | HR 67 | Resp 9

## 2012-12-19 DIAGNOSIS — M549 Dorsalgia, unspecified: Secondary | ICD-10-CM

## 2012-12-19 DIAGNOSIS — M5410 Radiculopathy, site unspecified: Secondary | ICD-10-CM

## 2012-12-19 MED ORDER — SODIUM CHLORIDE 0.9 % IV SOLN
Freq: Once | INTRAVENOUS | Status: AC
  Administered 2012-12-19: 08:00:00 via INTRAVENOUS

## 2012-12-19 MED ORDER — MEPERIDINE HCL 100 MG/ML IJ SOLN
100.0000 mg | Freq: Once | INTRAMUSCULAR | Status: AC
  Administered 2012-12-19: 100 mg via INTRAMUSCULAR

## 2012-12-19 MED ORDER — MIDAZOLAM HCL 2 MG/2ML IJ SOLN
1.0000 mg | INTRAMUSCULAR | Status: DC | PRN
  Administered 2012-12-19: 1 mg via INTRAVENOUS

## 2012-12-19 MED ORDER — ONDANSETRON HCL 4 MG/2ML IJ SOLN
4.0000 mg | Freq: Once | INTRAMUSCULAR | Status: AC
  Administered 2012-12-19: 4 mg via INTRAMUSCULAR

## 2012-12-19 MED ORDER — FENTANYL CITRATE 0.05 MG/ML IJ SOLN
25.0000 ug | INTRAMUSCULAR | Status: DC | PRN
  Administered 2012-12-19: 100 ug via INTRAVENOUS

## 2012-12-19 MED ORDER — CEFAZOLIN SODIUM-DEXTROSE 2-3 GM-% IV SOLR
2.0000 g | Freq: Once | INTRAVENOUS | Status: AC
  Administered 2012-12-19: 2 g via INTRAVENOUS

## 2012-12-19 MED ORDER — KETOROLAC TROMETHAMINE 30 MG/ML IJ SOLN
30.0000 mg | Freq: Once | INTRAMUSCULAR | Status: AC
  Administered 2012-12-19: 30 mg via INTRAVENOUS

## 2012-12-19 MED ORDER — IOHEXOL 180 MG/ML  SOLN
5.0000 mL | Freq: Once | INTRAMUSCULAR | Status: AC | PRN
  Administered 2012-12-19: 4.5 mL

## 2012-12-19 NOTE — Progress Notes (Signed)
Pt asked for more pain meds post procedure. She just can't get comfortable.

## 2012-12-19 NOTE — Progress Notes (Signed)
Heart sounds regular, lungs have a few scattered wheezes in the bases, upper lobes are clear. Discharge instructions explained. Pt has pain down both legs, right is worse than left.

## 2013-01-29 ENCOUNTER — Encounter (HOSPITAL_COMMUNITY): Payer: Self-pay | Admitting: Respiratory Therapy

## 2013-01-29 ENCOUNTER — Other Ambulatory Visit: Payer: Self-pay | Admitting: Neurosurgery

## 2013-02-03 ENCOUNTER — Encounter (HOSPITAL_COMMUNITY)
Admission: RE | Admit: 2013-02-03 | Discharge: 2013-02-03 | Disposition: A | Discharge status: Home / Self Care | Payer: Medicaid Other | Source: Ambulatory Visit | Attending: Neurosurgery | Admitting: Neurosurgery

## 2013-02-03 ENCOUNTER — Ambulatory Visit (HOSPITAL_COMMUNITY)
Admission: RE | Admit: 2013-02-03 | Discharge: 2013-02-03 | Disposition: A | Discharge status: Home / Self Care | Payer: Medicaid Other | Source: Ambulatory Visit | Attending: Anesthesiology | Admitting: Anesthesiology

## 2013-02-03 ENCOUNTER — Encounter (HOSPITAL_COMMUNITY): Payer: Self-pay

## 2013-02-03 DIAGNOSIS — Z01818 Encounter for other preprocedural examination: Secondary | ICD-10-CM | POA: Insufficient documentation

## 2013-02-03 DIAGNOSIS — Z01812 Encounter for preprocedural laboratory examination: Secondary | ICD-10-CM | POA: Insufficient documentation

## 2013-02-03 HISTORY — DX: Other intervertebral disc degeneration, lumbar region: M51.36

## 2013-02-03 HISTORY — DX: Other intervertebral disc degeneration, lumbar region without mention of lumbar back pain or lower extremity pain: M51.369

## 2013-02-03 HISTORY — DX: Other specified postprocedural states: Z98.890

## 2013-02-03 HISTORY — DX: Gastro-esophageal reflux disease without esophagitis: K21.9

## 2013-02-03 HISTORY — DX: Nausea with vomiting, unspecified: R11.2

## 2013-02-03 HISTORY — DX: Unspecified viral hepatitis C without hepatic coma: B19.20

## 2013-02-03 HISTORY — DX: Inflammatory liver disease, unspecified: K75.9

## 2013-02-03 LAB — CBC
HCT: 40.8 % (ref 36.0–46.0)
Hemoglobin: 14.7 g/dL (ref 12.0–15.0)
MCH: 32.7 pg (ref 26.0–34.0)
MCHC: 36 g/dL (ref 30.0–36.0)
MCV: 90.7 fL (ref 78.0–100.0)
Platelets: 301 K/uL (ref 150–400)
RBC: 4.5 MIL/uL (ref 3.87–5.11)
RDW: 12.5 % (ref 11.5–15.5)
WBC: 8.3 K/uL (ref 4.0–10.5)

## 2013-02-03 LAB — BASIC METABOLIC PANEL
Calcium: 9.8 mg/dL (ref 8.4–10.5)
Creatinine, Ser: 0.77 mg/dL (ref 0.50–1.10)
GFR calc Af Amer: >90 mL/min (ref >90)
Sodium: 138 mEq/L (ref 135–145)

## 2013-02-03 LAB — TYPE AND SCREEN
ABO/RH(D): B POS
Antibody Screen: NEGATIVE

## 2013-02-03 NOTE — Pre-Procedure Instructions (Signed)
Lavra Imler  02/03/2013   Your procedure is scheduled on:  Tuesday, July 15  Report to [facility] at 0800 AM.  Call this number if you have problems the morning of surgery: [phone]   Remember:   Do not eat food or drink liquids after midnight.tonight   Take these medicines the morning of surgery with A SIP OF WATER: fluoxetine (Prozac), Hydrocodone, Diovan   Do not wear jewelry, make-up or nail polish.  Do not wear lotions, powders, or perfumes. You may wear deodorant.  Do not shave 48 hours prior to surgery.   Do not bring valuables to the hospital.  [facility] is not responsible  for any belongings or valuables.  Contacts, dentures or bridgework may not be worn into surgery.  Leave suitcase in the car. After surgery it may be brought to your room.  For patients admitted to the hospital, checkout time is 11:00 AM the day of  discharge.   Special Instructions: Shower using CHG 2 nights before surgery and the night before surgery.  If you shower the day of surgery use CHG.  Use special wash - you have one bottle of CHG for all showers.  You should use approximately 1/3 of the bottle for each shower.   Please read over the following fact sheets that you were given: Pain Booklet, Coughing and Deep Breathing, Blood Transfusion Information and Surgical Site Infection Prevention

## 2013-02-04 ENCOUNTER — Inpatient Hospital Stay (HOSPITAL_COMMUNITY)
Admission: RE | Admit: 2013-02-04 | Discharge: 2013-02-10 | DRG: 459 | Disposition: A | Discharge status: Home Health | Payer: Medicaid Other | Source: Ambulatory Visit | Attending: Neurosurgery | Admitting: Neurosurgery

## 2013-02-04 ENCOUNTER — Encounter (HOSPITAL_COMMUNITY)
Admission: RE | Disposition: A | Discharge status: Home Health | Payer: Self-pay | Source: Ambulatory Visit | Attending: Neurosurgery

## 2013-02-04 ENCOUNTER — Encounter (HOSPITAL_COMMUNITY): Payer: Self-pay | Admitting: Critical Care Medicine

## 2013-02-04 ENCOUNTER — Ambulatory Visit (HOSPITAL_COMMUNITY): Payer: Medicaid Other

## 2013-02-04 ENCOUNTER — Ambulatory Visit (HOSPITAL_COMMUNITY): Payer: Medicaid Other | Admitting: Critical Care Medicine

## 2013-02-04 DIAGNOSIS — R109 Unspecified abdominal pain: Secondary | ICD-10-CM | POA: Diagnosis not present

## 2013-02-04 DIAGNOSIS — N170 Acute kidney failure with tubular necrosis: Secondary | ICD-10-CM | POA: Diagnosis not present

## 2013-02-04 DIAGNOSIS — D62 Acute posthemorrhagic anemia: Secondary | ICD-10-CM | POA: Diagnosis not present

## 2013-02-04 DIAGNOSIS — F3289 Other specified depressive episodes: Secondary | ICD-10-CM | POA: Diagnosis present

## 2013-02-04 DIAGNOSIS — Z01812 Encounter for preprocedural laboratory examination: Secondary | ICD-10-CM

## 2013-02-04 DIAGNOSIS — B1920 Unspecified viral hepatitis C without hepatic coma: Secondary | ICD-10-CM | POA: Diagnosis present

## 2013-02-04 DIAGNOSIS — I10 Essential (primary) hypertension: Secondary | ICD-10-CM | POA: Diagnosis present

## 2013-02-04 DIAGNOSIS — E785 Hyperlipidemia, unspecified: Secondary | ICD-10-CM | POA: Diagnosis present

## 2013-02-04 DIAGNOSIS — Z882 Allergy status to sulfonamides status: Secondary | ICD-10-CM

## 2013-02-04 DIAGNOSIS — I959 Hypotension, unspecified: Secondary | ICD-10-CM | POA: Diagnosis present

## 2013-02-04 DIAGNOSIS — K219 Gastro-esophageal reflux disease without esophagitis: Secondary | ICD-10-CM | POA: Diagnosis present

## 2013-02-04 DIAGNOSIS — M5137 Other intervertebral disc degeneration, lumbosacral region: Principal | ICD-10-CM | POA: Diagnosis present

## 2013-02-04 DIAGNOSIS — Z79899 Other long term (current) drug therapy: Secondary | ICD-10-CM

## 2013-02-04 DIAGNOSIS — K560 Paralytic ileus: Secondary | ICD-10-CM | POA: Diagnosis not present

## 2013-02-04 DIAGNOSIS — R112 Nausea with vomiting, unspecified: Secondary | ICD-10-CM | POA: Diagnosis not present

## 2013-02-04 DIAGNOSIS — N179 Acute kidney failure, unspecified: Secondary | ICD-10-CM | POA: Diagnosis not present

## 2013-02-04 DIAGNOSIS — F17200 Nicotine dependence, unspecified, uncomplicated: Secondary | ICD-10-CM | POA: Diagnosis present

## 2013-02-04 DIAGNOSIS — E871 Hypo-osmolality and hyponatremia: Secondary | ICD-10-CM | POA: Diagnosis not present

## 2013-02-04 DIAGNOSIS — Z96619 Presence of unspecified artificial shoulder joint: Secondary | ICD-10-CM

## 2013-02-04 DIAGNOSIS — M51379 Other intervertebral disc degeneration, lumbosacral region without mention of lumbar back pain or lower extremity pain: Principal | ICD-10-CM | POA: Diagnosis present

## 2013-02-04 DIAGNOSIS — F329 Major depressive disorder, single episode, unspecified: Secondary | ICD-10-CM

## 2013-02-04 DIAGNOSIS — R7989 Other specified abnormal findings of blood chemistry: Secondary | ICD-10-CM | POA: Diagnosis not present

## 2013-02-04 DIAGNOSIS — G8929 Other chronic pain: Secondary | ICD-10-CM | POA: Diagnosis present

## 2013-02-04 DIAGNOSIS — E7800 Pure hypercholesterolemia, unspecified: Secondary | ICD-10-CM | POA: Diagnosis present

## 2013-02-04 LAB — CBC
Hemoglobin: 11.7 g/dL — ABNORMAL LOW (ref 12.0–15.0)
MCH: 31.9 pg (ref 26.0–34.0)
MCHC: 34.7 g/dL (ref 30.0–36.0)
MCV: 91.8 fL (ref 78.0–100.0)
RBC: 3.67 MIL/uL — ABNORMAL LOW (ref 3.87–5.11)

## 2013-02-04 LAB — SURGICAL PCR SCREEN: Staphylococcus aureus: NEGATIVE

## 2013-02-04 SURGERY — POSTERIOR LUMBAR FUSION 2 LEVEL
Anesthesia: General | Wound class: Clean

## 2013-02-04 MED ORDER — LACTATED RINGERS IV SOLN
INTRAVENOUS | Status: DC
  Administered 2013-02-04 (×3): via INTRAVENOUS

## 2013-02-04 MED ORDER — PHENYLEPHRINE HCL 10 MG/ML IJ SOLN
INTRAMUSCULAR | Status: DC | PRN
  Administered 2013-02-04: 40 ug via INTRAVENOUS

## 2013-02-04 MED ORDER — ACETAMINOPHEN 650 MG RE SUPP: 650.0000 mg | RECTAL | Status: DC | PRN

## 2013-02-04 MED ORDER — THROMBIN 20000 UNITS EX SOLR
CUTANEOUS | Status: DC | PRN
  Administered 2013-02-04: 10:00:00 via TOPICAL

## 2013-02-04 MED ORDER — EPHEDRINE SULFATE 50 MG/ML IJ SOLN
INTRAMUSCULAR | Status: DC | PRN
  Administered 2013-02-04: 20 mg via INTRAVENOUS
  Administered 2013-02-04 (×2): 10 mg via INTRAVENOUS

## 2013-02-04 MED ORDER — DEXAMETHASONE SODIUM PHOSPHATE 4 MG/ML IJ SOLN
INTRAMUSCULAR | Status: DC | PRN
  Administered 2013-02-04: 4 mg via INTRAVENOUS

## 2013-02-04 MED ORDER — ACETAMINOPHEN 325 MG PO TABS
650.0000 mg | ORAL_TABLET | ORAL | Status: DC | PRN
  Administered 2013-02-07 – 2013-02-08 (×2): 650 mg via ORAL
  Filled 2013-02-04 (×2): qty 2

## 2013-02-04 MED ORDER — SODIUM CHLORIDE 0.9 % IJ SOLN: 3.0000 mL | INTRAMUSCULAR | Status: DC | PRN

## 2013-02-04 MED ORDER — ROCURONIUM BROMIDE 100 MG/10ML IV SOLN
INTRAVENOUS | Status: DC | PRN
  Administered 2013-02-04: 10 mg via INTRAVENOUS
  Administered 2013-02-04: 50 mg via INTRAVENOUS
  Administered 2013-02-04: 10 mg via INTRAVENOUS

## 2013-02-04 MED ORDER — HEMOSTATIC AGENTS (NO CHARGE) OPTIME
TOPICAL | Status: DC | PRN
  Administered 2013-02-04: 1 via TOPICAL

## 2013-02-04 MED ORDER — SIMVASTATIN 20 MG PO TABS
20.0000 mg | ORAL_TABLET | Freq: Every evening | ORAL | Status: DC
  Administered 2013-02-04: 20 mg via ORAL
  Filled 2013-02-04 (×2): qty 1

## 2013-02-04 MED ORDER — 0.9 % SODIUM CHLORIDE (POUR BTL) OPTIME
TOPICAL | Status: DC | PRN
  Administered 2013-02-04: 1000 mL

## 2013-02-04 MED ORDER — CEFAZOLIN SODIUM 1-5 GM-% IV SOLN
1.0000 g | Freq: Four times a day (QID) | INTRAVENOUS | Status: AC
  Administered 2013-02-04 – 2013-02-05 (×2): 1 g via INTRAVENOUS
  Filled 2013-02-04 (×2): qty 50

## 2013-02-04 MED ORDER — DIAZEPAM 5 MG PO TABS
ORAL_TABLET | ORAL | Status: AC
  Filled 2013-02-04: qty 1

## 2013-02-04 MED ORDER — HYDROMORPHONE HCL PF 1 MG/ML IJ SOLN
INTRAMUSCULAR | Status: AC
  Filled 2013-02-04: qty 1

## 2013-02-04 MED ORDER — BUPIVACAINE LIPOSOME 1.3 % IJ SUSP
20.0000 mL | INTRAMUSCULAR | Status: AC
  Filled 2013-02-04: qty 20

## 2013-02-04 MED ORDER — MUPIROCIN 2 % EX OINT
TOPICAL_OINTMENT | Freq: Two times a day (BID) | CUTANEOUS | Status: DC
  Administered 2013-02-04 – 2013-02-09 (×10): via NASAL
  Filled 2013-02-04 (×2): qty 22

## 2013-02-04 MED ORDER — DIPHENHYDRAMINE HCL 12.5 MG/5ML PO ELIX: 12.5000 mg | ORAL_SOLUTION | Freq: Four times a day (QID) | ORAL | Status: DC | PRN

## 2013-02-04 MED ORDER — MENTHOL 3 MG MT LOZG: 1.0000 | LOZENGE | OROMUCOSAL | Status: DC | PRN

## 2013-02-04 MED ORDER — FLUOXETINE HCL 20 MG PO CAPS
40.0000 mg | ORAL_CAPSULE | Freq: Every day | ORAL | Status: DC
  Administered 2013-02-06 – 2013-02-10 (×5): 40 mg via ORAL
  Filled 2013-02-04 (×6): qty 2

## 2013-02-04 MED ORDER — ZOLPIDEM TARTRATE 5 MG PO TABS
5.0000 mg | ORAL_TABLET | Freq: Every evening | ORAL | Status: DC | PRN
  Administered 2013-02-08 – 2013-02-10 (×2): 5 mg via ORAL
  Filled 2013-02-04 (×2): qty 1

## 2013-02-04 MED ORDER — DIAZEPAM 5 MG PO TABS
5.0000 mg | ORAL_TABLET | Freq: Four times a day (QID) | ORAL | Status: DC | PRN
  Administered 2013-02-04 – 2013-02-10 (×7): 5 mg via ORAL
  Filled 2013-02-04 (×6): qty 1

## 2013-02-04 MED ORDER — FUROSEMIDE 20 MG PO TABS
20.0000 mg | ORAL_TABLET | Freq: Every day | ORAL | Status: DC
  Administered 2013-02-04: 20 mg via ORAL
  Filled 2013-02-04 (×2): qty 1

## 2013-02-04 MED ORDER — MUPIROCIN 2 % EX OINT
TOPICAL_OINTMENT | CUTANEOUS | Status: AC
  Administered 2013-02-04: 1 via NASAL
  Filled 2013-02-04: qty 22

## 2013-02-04 MED ORDER — VALSARTAN-HYDROCHLOROTHIAZIDE 160-12.5 MG PO TABS: 1.0000 | ORAL_TABLET | Freq: Every day | ORAL | Status: DC

## 2013-02-04 MED ORDER — SODIUM CHLORIDE 0.9 % IJ SOLN: 9.0000 mL | INTRAMUSCULAR | Status: DC | PRN

## 2013-02-04 MED ORDER — ONDANSETRON HCL 4 MG/2ML IJ SOLN
4.0000 mg | INTRAMUSCULAR | Status: DC | PRN
  Administered 2013-02-05: 4 mg via INTRAVENOUS
  Filled 2013-02-04 (×2): qty 2

## 2013-02-04 MED ORDER — HYDROMORPHONE HCL PF 1 MG/ML IJ SOLN
0.2500 mg | INTRAMUSCULAR | Status: DC | PRN
  Administered 2013-02-04 (×4): 0.5 mg via INTRAVENOUS

## 2013-02-04 MED ORDER — SODIUM CHLORIDE 0.9 % IV SOLN
INTRAVENOUS | Status: DC
  Administered 2013-02-04 – 2013-02-07 (×3): via INTRAVENOUS

## 2013-02-04 MED ORDER — OXYCODONE-ACETAMINOPHEN 5-325 MG PO TABS
1.0000 | ORAL_TABLET | ORAL | Status: DC | PRN
  Administered 2013-02-04 – 2013-02-07 (×7): 2 via ORAL
  Filled 2013-02-04 (×7): qty 2

## 2013-02-04 MED ORDER — PROPOFOL 10 MG/ML IV BOLUS
INTRAVENOUS | Status: DC | PRN
  Administered 2013-02-04: 50 mg via INTRAVENOUS
  Administered 2013-02-04: 150 mg via INTRAVENOUS

## 2013-02-04 MED ORDER — SODIUM CHLORIDE 0.9 % IJ SOLN
3.0000 mL | Freq: Two times a day (BID) | INTRAMUSCULAR | Status: DC
  Administered 2013-02-07: 3 mL via INTRAVENOUS
  Administered 2013-02-08: 10:00:00 via INTRAVENOUS
  Administered 2013-02-09 (×2): 3 mL via INTRAVENOUS

## 2013-02-04 MED ORDER — FENTANYL CITRATE 0.05 MG/ML IJ SOLN
INTRAMUSCULAR | Status: DC | PRN
  Administered 2013-02-04: 25 ug via INTRAVENOUS
  Administered 2013-02-04 (×6): 50 ug via INTRAVENOUS
  Administered 2013-02-04: 100 ug via INTRAVENOUS
  Administered 2013-02-04: 50 ug via INTRAVENOUS
  Administered 2013-02-04: 25 ug via INTRAVENOUS

## 2013-02-04 MED ORDER — PHENYLEPHRINE HCL 10 MG/ML IJ SOLN
10.0000 mg | INTRAVENOUS | Status: DC | PRN
  Administered 2013-02-04: 25 ug/min via INTRAVENOUS

## 2013-02-04 MED ORDER — MORPHINE SULFATE (PF) 1 MG/ML IV SOLN
INTRAVENOUS | Status: DC
  Administered 2013-02-04: 7.5 mg via INTRAVENOUS
  Administered 2013-02-04: 19.5 mg via INTRAVENOUS
  Administered 2013-02-04: 25 mg via INTRAVENOUS
  Administered 2013-02-04: 15:00:00 via INTRAVENOUS
  Administered 2013-02-05: 25 mg via INTRAVENOUS
  Administered 2013-02-05: 7.5 mg via INTRAVENOUS
  Administered 2013-02-05: 10.36 mg via INTRAVENOUS
  Administered 2013-02-05: 2.9 mg via INTRAVENOUS
  Filled 2013-02-04 (×2): qty 25

## 2013-02-04 MED ORDER — ARTIFICIAL TEARS OP OINT
TOPICAL_OINTMENT | OPHTHALMIC | Status: DC | PRN
  Administered 2013-02-04: 1 via OPHTHALMIC

## 2013-02-04 MED ORDER — NEOSTIGMINE METHYLSULFATE 1 MG/ML IJ SOLN
INTRAMUSCULAR | Status: DC | PRN
  Administered 2013-02-04: 4 mg via INTRAVENOUS

## 2013-02-04 MED ORDER — SODIUM CHLORIDE 0.9 % IV SOLN: 250.0000 mL | INTRAVENOUS | Status: DC

## 2013-02-04 MED ORDER — SODIUM CHLORIDE 0.9 % IV BOLUS (SEPSIS)
500.0000 mL | Freq: Once | INTRAVENOUS | Status: AC
  Administered 2013-02-04: 500 mL via INTRAVENOUS

## 2013-02-04 MED ORDER — GLYCOPYRROLATE 0.2 MG/ML IJ SOLN
INTRAMUSCULAR | Status: DC | PRN
  Administered 2013-02-04: 0.6 mg via INTRAVENOUS

## 2013-02-04 MED ORDER — CEFAZOLIN SODIUM-DEXTROSE 2-3 GM-% IV SOLR
2.0000 g | INTRAVENOUS | Status: AC
  Administered 2013-02-04: 2 g via INTRAVENOUS

## 2013-02-04 MED ORDER — ONDANSETRON HCL 4 MG/2ML IJ SOLN: 4.0000 mg | Freq: Once | INTRAMUSCULAR | Status: DC | PRN

## 2013-02-04 MED ORDER — DIPHENHYDRAMINE HCL 50 MG/ML IJ SOLN: 12.5000 mg | Freq: Four times a day (QID) | INTRAMUSCULAR | Status: DC | PRN

## 2013-02-04 MED ORDER — LIDOCAINE HCL (CARDIAC) 20 MG/ML IV SOLN
INTRAVENOUS | Status: DC | PRN
  Administered 2013-02-04: 40 mg via INTRAVENOUS

## 2013-02-04 MED ORDER — CEFAZOLIN SODIUM-DEXTROSE 2-3 GM-% IV SOLR
INTRAVENOUS | Status: AC
  Filled 2013-02-04: qty 50

## 2013-02-04 MED ORDER — ONDANSETRON HCL 4 MG/2ML IJ SOLN
4.0000 mg | Freq: Four times a day (QID) | INTRAMUSCULAR | Status: DC | PRN
  Administered 2013-02-04 – 2013-02-05 (×2): 4 mg via INTRAVENOUS
  Filled 2013-02-04: qty 2

## 2013-02-04 MED ORDER — NALOXONE HCL 0.4 MG/ML IJ SOLN: 0.4000 mg | INTRAMUSCULAR | Status: DC | PRN

## 2013-02-04 MED ORDER — MORPHINE SULFATE (PF) 1 MG/ML IV SOLN
INTRAVENOUS | Status: AC
  Administered 2013-02-04: 3 mg
  Filled 2013-02-04: qty 25

## 2013-02-04 MED ORDER — OXYBUTYNIN CHLORIDE ER 5 MG PO TB24
5.0000 mg | ORAL_TABLET | Freq: Every day | ORAL | Status: DC
  Administered 2013-02-04 – 2013-02-09 (×5): 5 mg via ORAL
  Filled 2013-02-04 (×7): qty 1

## 2013-02-04 MED ORDER — BUPIVACAINE LIPOSOME 1.3 % IJ SUSP
INTRAMUSCULAR | Status: DC | PRN
  Administered 2013-02-04: 20 mL

## 2013-02-04 MED ORDER — ONDANSETRON HCL 4 MG/2ML IJ SOLN
INTRAMUSCULAR | Status: DC | PRN
  Administered 2013-02-04 (×2): 4 mg via INTRAVENOUS

## 2013-02-04 MED ORDER — IRBESARTAN 150 MG PO TABS
150.0000 mg | ORAL_TABLET | Freq: Every day | ORAL | Status: DC
  Filled 2013-02-04: qty 1

## 2013-02-04 MED ORDER — ALUM & MAG HYDROXIDE-SIMETH 200-200-20 MG/5ML PO SUSP
30.0000 mL | Freq: Four times a day (QID) | ORAL | Status: DC | PRN
  Administered 2013-02-04: 30 mL via ORAL
  Filled 2013-02-04 (×2): qty 30

## 2013-02-04 MED ORDER — HYDROCHLOROTHIAZIDE 12.5 MG PO CAPS
12.5000 mg | ORAL_CAPSULE | Freq: Every day | ORAL | Status: DC
  Filled 2013-02-04: qty 1

## 2013-02-04 MED ORDER — PHENOL 1.4 % MT LIQD
1.0000 | OROMUCOSAL | Status: DC | PRN
  Filled 2013-02-04: qty 177

## 2013-02-04 SURGICAL SUPPLY — 76 items
APL SKNCLS STERI-STRIP NONHPOA (GAUZE/BANDAGES/DRESSINGS) ×1
BENZOIN TINCTURE PRP APPL 2/3 (GAUZE/BANDAGES/DRESSINGS) ×2 IMPLANT
BLADE SURG ROTATE 9660 (MISCELLANEOUS) IMPLANT
BONE EQUIVA 10CC (Bone Implant) ×1 IMPLANT
BUR ACORN 6.0 (BURR) ×2 IMPLANT
BUR MATCHSTICK NEURO 3.0 LAGG (BURR) ×2 IMPLANT
CANISTER SUCTION 2500CC (MISCELLANEOUS) ×2 IMPLANT
CAP REVERE LOCKING (Cap) ×6 IMPLANT
CLOTH BEACON ORANGE TIMEOUT ST (SAFETY) ×2 IMPLANT
CONN CROSSLINK REV 6.35 48-60 (Connector) ×2 IMPLANT
CONNECTOR CRSLNK REV6.35 48-60 (Connector) IMPLANT
CONT SPEC 4OZ CLIKSEAL STRL BL (MISCELLANEOUS) ×3 IMPLANT
COVER BACK TABLE 24X17X13 BIG (DRAPES) IMPLANT
COVER TABLE BACK 60X90 (DRAPES) ×2 IMPLANT
DRAPE C-ARM 42X72 X-RAY (DRAPES) ×4 IMPLANT
DRAPE LAPAROTOMY 100X72X124 (DRAPES) ×2 IMPLANT
DRAPE POUCH INSTRU U-SHP 10X18 (DRAPES) ×2 IMPLANT
DRSG PAD ABDOMINAL 8X10 ST (GAUZE/BANDAGES/DRESSINGS) ×1 IMPLANT
DURAPREP 26ML APPLICATOR (WOUND CARE) ×2 IMPLANT
ELECT BLADE 4.0 EZ CLEAN MEGAD (MISCELLANEOUS) ×2
ELECT REM PT RETURN 9FT ADLT (ELECTROSURGICAL) ×2
ELECTRODE BLDE 4.0 EZ CLN MEGD (MISCELLANEOUS) IMPLANT
ELECTRODE REM PT RTRN 9FT ADLT (ELECTROSURGICAL) ×1 IMPLANT
EVACUATOR 1/8 PVC DRAIN (DRAIN) IMPLANT
EVACUATOR 3/16  PVC DRAIN (DRAIN) ×1
EVACUATOR 3/16 PVC DRAIN (DRAIN) IMPLANT
GAUZE SPONGE 4X4 16PLY XRAY LF (GAUZE/BANDAGES/DRESSINGS) ×2 IMPLANT
GLOVE BIOGEL M 8.0 STRL (GLOVE) ×3 IMPLANT
GLOVE BIOGEL PI IND STRL 7.5 (GLOVE) IMPLANT
GLOVE BIOGEL PI IND STRL 8 (GLOVE) IMPLANT
GLOVE BIOGEL PI INDICATOR 7.5 (GLOVE) ×1
GLOVE BIOGEL PI INDICATOR 8 (GLOVE) ×3
GLOVE ECLIPSE 7.5 STRL STRAW (GLOVE) ×6 IMPLANT
GLOVE EXAM NITRILE LRG STRL (GLOVE) ×2 IMPLANT
GLOVE EXAM NITRILE MD LF STRL (GLOVE) IMPLANT
GLOVE EXAM NITRILE XL STR (GLOVE) IMPLANT
GLOVE EXAM NITRILE XS STR PU (GLOVE) IMPLANT
GOWN BRE IMP SLV AUR LG STRL (GOWN DISPOSABLE) ×2 IMPLANT
GOWN BRE IMP SLV AUR XL STRL (GOWN DISPOSABLE) ×2 IMPLANT
GOWN STRL REIN 2XL LVL4 (GOWN DISPOSABLE) ×2 IMPLANT
KIT BASIN OR (CUSTOM PROCEDURE TRAY) ×2 IMPLANT
KIT ROOM TURNOVER OR (KITS) ×2 IMPLANT
MILL MEDIUM DISP (BLADE) ×1 IMPLANT
NDL HYPO 18GX1.5 BLUNT FILL (NEEDLE) IMPLANT
NDL HYPO 21X1.5 SAFETY (NEEDLE) IMPLANT
NDL HYPO 25X1 1.5 SAFETY (NEEDLE) IMPLANT
NEEDLE HYPO 18GX1.5 BLUNT FILL (NEEDLE) IMPLANT
NEEDLE HYPO 21X1.5 SAFETY (NEEDLE) ×2 IMPLANT
NEEDLE HYPO 25X1 1.5 SAFETY (NEEDLE) IMPLANT
NS IRRIG 1000ML POUR BTL (IV SOLUTION) ×2 IMPLANT
PACK LAMINECTOMY NEURO (CUSTOM PROCEDURE TRAY) ×2 IMPLANT
PAD ARMBOARD 7.5X6 YLW CONV (MISCELLANEOUS) ×6 IMPLANT
PATTIES SURGICAL .5 X1 (DISPOSABLE) ×2 IMPLANT
PATTIES SURGICAL .5 X3 (DISPOSABLE) IMPLANT
PATTIES SURGICAL 1X1 (DISPOSABLE) ×1 IMPLANT
ROD REVERE 6.35 CURVED 60MM (Rod) ×2 IMPLANT
SCREW REVERE 5.5X50 (Screw) ×2 IMPLANT
SCREW REVERE 6.35 5.5X40MM (Screw) ×4 IMPLANT
SPACER SUSTAIN O SML 8X22 12MM (Spacer) ×1 IMPLANT
SPACER SUSTAIN O SML 8X22 8MM (Spacer) ×1 IMPLANT
SPONGE GAUZE 4X4 12PLY (GAUZE/BANDAGES/DRESSINGS) ×2 IMPLANT
SPONGE LAP 4X18 X RAY DECT (DISPOSABLE) ×1 IMPLANT
SPONGE NEURO XRAY DETECT 1X3 (DISPOSABLE) IMPLANT
SPONGE SURGIFOAM ABS GEL 100 (HEMOSTASIS) ×2 IMPLANT
STRIP CLOSURE SKIN 1/2X4 (GAUZE/BANDAGES/DRESSINGS) ×2 IMPLANT
SUT VIC AB 1 CT1 18XBRD ANBCTR (SUTURE) ×2 IMPLANT
SUT VIC AB 1 CT1 8-18 (SUTURE) ×4
SUT VIC AB 2-0 CP2 18 (SUTURE) ×3 IMPLANT
SUT VIC AB 3-0 SH 8-18 (SUTURE) ×3 IMPLANT
SYR 20CC LL (SYRINGE) ×1 IMPLANT
SYR 20ML ECCENTRIC (SYRINGE) ×2 IMPLANT
SYR 5ML LL (SYRINGE) IMPLANT
TOWEL OR 17X24 6PK STRL BLUE (TOWEL DISPOSABLE) ×2 IMPLANT
TOWEL OR 17X26 10 PK STRL BLUE (TOWEL DISPOSABLE) ×2 IMPLANT
TRAY FOLEY CATH 14FRSI W/METER (CATHETERS) ×2 IMPLANT
WATER STERILE IRR 1000ML POUR (IV SOLUTION) ×2 IMPLANT

## 2013-02-04 NOTE — H&P (Signed)
Katelyn [name] is an 52 y.o. female.   Chief Complaint: lumbar pain HPI: patient who was seen by me initially in 2008 with the main complain of lumbar pain with radiation to the right leg.  Seen by a spine surgeon in [place] ,[place], SHE WAS ADVISED ABOUT SURGICAL FUSION. Her insurance declined. She has had conservative treatment with PT and also the pain clinic. She is no better. Had a lumbar discogram which was positive at the level of l45,l5s1. She is being admitted for surgery  Past Medical History  Diagnosis Date  . Depression   . Panic attack   . Hypertension   . Hypercholesteremia   . Goiter   . PONV (postoperative nausea and vomiting)   . GERD (gastroesophageal reflux disease)   . Degenerative disc disease, lumbar   . Hepatitis   . Hepatitis C infection 2004    cured with treatment of interferon and ribavirin    Past Surgical History  Procedure Laterality Date  . Total shoulder arthroplasty    . Foot surgery    . Neck surgery    . Tonsillectomy    . Colonoscopy N/A 10/14/2012    Procedure: COLONOSCOPY;  Surgeon: Corbin Ade, MD;  Location: [facility];  Service: Endoscopy;  Laterality: N/A;  9:30 AM    Family History  Problem Relation Age of Onset  . Colon cancer Neg Hx    Social History:  reports that she has been smoking Cigarettes.  She has a 17.5 pack-year smoking history. She has never used smokeless tobacco. She reports that she does not drink alcohol or use illicit drugs.  Allergies:  Allergies  Allergen Reactions  . Sulfa Antibiotics Nausea And Vomiting and Rash    Medications Prior to Admission  Medication Sig Dispense Refill  . FLUoxetine (PROZAC) 40 MG capsule Take 40 mg by mouth daily.        . furosemide (LASIX) 20 MG tablet Take 20 mg by mouth daily.      Marland Kitchen HYDROcodone-acetaminophen (NORCO) 7.5-325 MG per tablet Take 1 tablet by mouth every 6 (six) hours.      . meloxicam (MOBIC) 15 MG tablet Take 15 mg by mouth daily.      Marland Kitchen omeprazole (PRILOSEC)  20 MG capsule Take 20 mg by mouth daily.      . potassium chloride SA (K-DUR,KLOR-CON) 20 MEQ tablet Take 20 mEq by mouth daily.      . simvastatin (ZOCOR) 20 MG tablet Take 20 mg by mouth every evening.      . tolterodine (DETROL) 1 MG tablet Take 1 mg by mouth daily.      . valsartan-hydrochlorothiazide (DIOVAN-HCT) 160-12.5 MG per tablet Take 1 tablet by mouth daily.        Results for orders placed during the hospital encounter of 02/03/13 (from the past 48 hour(s))  BASIC METABOLIC PANEL     Status: None   Collection Time    02/03/13  4:19 PM      Result Value Range   Sodium 138  135 - 145 mEq/L   Potassium 3.5  3.5 - 5.1 mEq/L   Chloride 98  96 - 112 mEq/L   CO2 29  19 - 32 mEq/L   Glucose, Bld 93  70 - 99 mg/dL   BUN 13  6 - 23 mg/dL   Creatinine, Ser 1.61  0.50 - 1.10 mg/dL   Calcium 9.8  8.4 - 09.6 mg/dL   GFR calc non Af Amer >90  >  90 mL/min   GFR calc Af Amer >90  >90 mL/min   Comment:            The eGFR has been calculated     using the CKD EPI equation.     This calculation has not been     validated in all clinical     situations.     eGFR's persistently     <90 mL/min signify     possible Chronic Kidney Disease.  CBC     Status: None   Collection Time    02/03/13  4:19 PM      Result Value Range   WBC 8.3  4.0 - 10.5 K/uL   RBC 4.50  3.87 - 5.11 MIL/uL   Hemoglobin 14.7  12.0 - 15.0 g/dL   HCT 16.1  09.6 - 04.5 %   MCV 90.7  78.0 - 100.0 fL   MCH 32.7  26.0 - 34.0 pg   MCHC 36.0  30.0 - 36.0 g/dL   RDW 40.9  81.1 - 91.4 %   Platelets 301  150 - 400 K/uL  TYPE AND SCREEN     Status: None   Collection Time    02/03/13  4:30 PM      Result Value Range   ABO/RH(D) B POS     Antibody Screen NEG     Sample Expiration 02/06/2013     Dg Chest 2 View  02/03/2013   *RADIOLOGY REPORT*  Clinical Data: Preop for lumbar surgery  CHEST - 2 VIEW  Comparison: 04/26/2011  Findings: Cardiomediastinal silhouette is stable.  No acute infiltrate or pleural effusion.  No  pulmonary edema.  Metallic fixation plate noted cervical spine.  Bony thorax is unremarkable. Old right rib fractures.  IMPRESSION: No active disease.   Original Report Authenticated By: Natasha Mead, M.D.    Review of Systems  Constitutional: Negative.   HENT: Negative.   Cardiovascular: Negative.   Gastrointestinal: Positive for heartburn.  Musculoskeletal: Positive for back pain.  Skin: Negative.   Neurological: Positive for sensory change and focal weakness.  Endo/Heme/Allergies:       Thyroid disease  Psychiatric/Behavioral: Negative.     Blood pressure 126/71, pulse 67, temperature 97.1 F (36.2 C), temperature source Oral, resp. rate 20, SpO2 96.00%. Physical Exam hent,nl. Neck, scar anterior with some pain with lateralization. Cv, nl lugs, clear. Abdomen, soft. Extremities tenderness in left shoulder. NEURO slr positive bilaterally at 60 degrees. Decrease of flexibility of the lumbar spine. Dtr, wnl. Discogram at l45 and l5s1 are positive with reproduction of the pain and abnormal anatomy. 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 Patient to proceed with decompression and fusion at l45 and l5s1. She is aware of the risks and benefits, including no improvement secondary to her chronicity  Tabby Beaston M 02/04/2013, 10:17 AM

## 2013-02-04 NOTE — Anesthesia Postprocedure Evaluation (Signed)
  Anesthesia Post-op Note  Patient: Katelyn [name]  Procedure(s) Performed: Procedure(s) with comments: POSTERIOR LUMBAR FUSION 2 LEVEL (N/A) - Lumbar four-five Lumbar five sacral one Posterior lumbar interbody fusion  Patient Location: PACU  Anesthesia Type:General  Level of Consciousness: awake, alert , oriented and patient cooperative  Airway and Oxygen Therapy: Patient Spontanous Breathing  Post-op Pain: none  Post-op Assessment: Post-op Vital signs reviewed, Patient's Cardiovascular Status Stable, Respiratory Function Stable, Patent Airway, No signs of Nausea or vomiting and Pain level controlled  Post-op Vital Signs: stable  Complications: No apparent anesthesia complications

## 2013-02-04 NOTE — Progress Notes (Signed)
Upon arriving to floor, patient noted by NT to have BP 85/50, this was manually checked by me RN and found to be accurate, patient alert, no distress other than pain, called Dr Jeral Fruit who ordered stat CBC and 500 cc bolus. Bolus now begun  Minor, [employer]

## 2013-02-04 NOTE — Anesthesia Procedure Notes (Signed)
Procedure Name: Intubation Date/Time: 02/04/2013 10:40 AM Performed by: Elon Alas Pre-anesthesia Checklist: Patient identified, Timeout performed, Emergency Drugs available, Suction available and Patient being monitored Patient Re-evaluated:Patient Re-evaluated prior to inductionOxygen Delivery Method: Circle system utilized Preoxygenation: Pre-oxygenation with 100% oxygen Intubation Type: IV induction Ventilation: Mask ventilation without difficulty Laryngoscope Size: Mac and 4 Grade View: Grade III Tube type: Oral Tube size: 7.0 mm Number of attempts: 1 Airway Equipment and Method: Stylet and LTA kit utilized Placement Confirmation: positive ETCO2,  ETT inserted through vocal cords under direct vision and breath sounds checked- equal and bilateral Secured at: 22 cm Tube secured with: Tape Dental Injury: Teeth and Oropharynx as per pre-operative assessment

## 2013-02-04 NOTE — Progress Notes (Signed)
Op note [id number]

## 2013-02-04 NOTE — Preoperative (Signed)
Beta Blockers   Reason not to administer Beta Blockers:Not Applicable, pt not on beta blocker 

## 2013-02-04 NOTE — Transfer of Care (Signed)
Immediate Anesthesia Transfer of Care Note  Patient: Katelyn [name]  Procedure(s) Performed: Procedure(s) with comments: POSTERIOR LUMBAR FUSION 2 LEVEL (N/A) - Lumbar four-five Lumbar five sacral one Posterior lumbar interbody fusion  Patient Location: PACU  Anesthesia Type:General  Level of Consciousness: awake, alert  and oriented  Airway & Oxygen Therapy: Patient Spontanous Breathing and Patient connected to nasal cannula oxygen  Post-op Assessment: Report given to PACU RN, Post -op Vital signs reviewed and stable and Patient moving all extremities X 4  Post vital signs: Reviewed and stable  Complications: No apparent anesthesia complications

## 2013-02-04 NOTE — Anesthesia Preprocedure Evaluation (Addendum)
Anesthesia Evaluation  Patient identified by MRN, date of birth, ID band Patient awake    Reviewed: Allergy & Precautions, H&P , NPO status , Patient's Chart, lab work & pertinent test results  History of Anesthesia Complications (+) PONV  Airway Mallampati: II TM Distance: >3 FB Neck ROM: Full    Dental  (+) Dental Advisory Given and Teeth Intact   Pulmonary Current Smoker,          Cardiovascular hypertension, Pt. on medications     Neuro/Psych PSYCHIATRIC DISORDERS Anxiety Depression    GI/Hepatic GERD-  ,(+) Hepatitis -, C  Endo/Other  goiter  Renal/GU      Musculoskeletal   Abdominal   Peds  Hematology   Anesthesia Other Findings   Reproductive/Obstetrics                         Anesthesia Physical Anesthesia Plan  ASA: III  Anesthesia Plan: General   Post-op Pain Management:    Induction: Intravenous  Airway Management Planned: Oral ETT  Additional Equipment:   Intra-op Plan:   Post-operative Plan: Extubation in OR  Informed Consent: I have reviewed the patients History and Physical, chart, labs and discussed the procedure including the risks, benefits and alternatives for the proposed anesthesia with the patient or authorized representative who has indicated his/her understanding and acceptance.   Dental advisory given  Plan Discussed with: Anesthesiologist, Surgeon and CRNA  Anesthesia Plan Comments:        Anesthesia Quick Evaluation

## 2013-02-04 NOTE — Progress Notes (Signed)
Doing well. C/o incisional pain. No family members around. Went twice to the waiting area and paged them

## 2013-02-05 ENCOUNTER — Inpatient Hospital Stay (HOSPITAL_COMMUNITY): Payer: Medicaid Other

## 2013-02-05 DIAGNOSIS — N179 Acute kidney failure, unspecified: Secondary | ICD-10-CM | POA: Diagnosis not present

## 2013-02-05 DIAGNOSIS — I959 Hypotension, unspecified: Secondary | ICD-10-CM

## 2013-02-05 DIAGNOSIS — R109 Unspecified abdominal pain: Secondary | ICD-10-CM | POA: Diagnosis not present

## 2013-02-05 DIAGNOSIS — E871 Hypo-osmolality and hyponatremia: Secondary | ICD-10-CM | POA: Diagnosis not present

## 2013-02-05 LAB — COMPREHENSIVE METABOLIC PANEL
ALT: 22 U/L (ref 0–35)
AST: 66 U/L — ABNORMAL HIGH (ref 0–37)
CO2: 23 mEq/L (ref 19–32)
Calcium: 7.7 mg/dL — ABNORMAL LOW (ref 8.4–10.5)
Creatinine, Ser: 2.21 mg/dL — ABNORMAL HIGH (ref 0.50–1.10)
GFR calc Af Amer: 28 mL/min — ABNORMAL LOW (ref >90)
GFR calc non Af Amer: 24 mL/min — ABNORMAL LOW (ref >90)
Glucose, Bld: 98 mg/dL (ref 70–99)
Sodium: 128 mEq/L — ABNORMAL LOW (ref 135–145)
Total Protein: 6.2 g/dL (ref 6.0–8.3)

## 2013-02-05 LAB — DIFFERENTIAL
Basophils Absolute: 0 10*3/uL (ref 0.0–0.1)
Basophils Absolute: 0 10*3/uL (ref 0.0–0.1)
Basophils Relative: 0 % (ref 0–1)
Eosinophils Absolute: 0 10*3/uL (ref 0.0–0.7)
Eosinophils Relative: 0 % (ref 0–5)
Eosinophils Relative: 0 % (ref 0–5)
Lymphocytes Relative: 13 % (ref 12–46)
Lymphocytes Relative: 18 % (ref 12–46)
Monocytes Absolute: 0.7 10*3/uL (ref 0.1–1.0)
Monocytes Relative: 6 % (ref 3–12)
Neutrophils Relative %: 81 % — ABNORMAL HIGH (ref 43–77)

## 2013-02-05 LAB — CBC
HCT: 27.9 % — ABNORMAL LOW (ref 36.0–46.0)
Hemoglobin: 9.4 g/dL — ABNORMAL LOW (ref 12.0–15.0)
MCH: 32.3 pg (ref 26.0–34.0)
MCHC: 35.1 g/dL (ref 30.0–36.0)
MCHC: 33.7 g/dL (ref 30.0–36.0)
MCV: 92 fL (ref 78.0–100.0)
Platelets: 242 10*3/uL (ref 150–400)
RBC: 3.01 MIL/uL — ABNORMAL LOW (ref 3.87–5.11)

## 2013-02-05 MED ORDER — DIPHENHYDRAMINE HCL 50 MG/ML IJ SOLN: 12.5000 mg | Freq: Four times a day (QID) | INTRAMUSCULAR | Status: DC | PRN

## 2013-02-05 MED ORDER — NALOXONE HCL 0.4 MG/ML IJ SOLN: 0.4000 mg | INTRAMUSCULAR | Status: DC | PRN

## 2013-02-05 MED ORDER — FAMOTIDINE IN NACL 20-0.9 MG/50ML-% IV SOLN
20.0000 mg | Freq: Two times a day (BID) | INTRAVENOUS | Status: DC
  Filled 2013-02-05 (×2): qty 50

## 2013-02-05 MED ORDER — SODIUM CHLORIDE 0.9 % IV SOLN
INTRAVENOUS | Status: AC
  Administered 2013-02-05 – 2013-02-06 (×2): via INTRAVENOUS

## 2013-02-05 MED ORDER — SODIUM CHLORIDE 0.9 % IV BOLUS (SEPSIS)
1000.0000 mL | Freq: Once | INTRAVENOUS | Status: AC
  Administered 2013-02-05: 1000 mL via INTRAVENOUS

## 2013-02-05 MED ORDER — SODIUM CHLORIDE 0.9 % IJ SOLN: 9.0000 mL | INTRAMUSCULAR | Status: DC | PRN

## 2013-02-05 MED ORDER — ONDANSETRON HCL 4 MG/2ML IJ SOLN
4.0000 mg | Freq: Four times a day (QID) | INTRAMUSCULAR | Status: DC | PRN
Start: 2013-02-05 — End: 2013-02-10
  Administered 2013-02-06: 4 mg via INTRAVENOUS
  Filled 2013-02-05: qty 2

## 2013-02-05 MED ORDER — FENTANYL 10 MCG/ML IV SOLN
INTRAVENOUS | Status: DC
  Administered 2013-02-05: 10 ug/h via INTRAVENOUS
  Administered 2013-02-05: 160 ug via INTRAVENOUS
  Administered 2013-02-06: 80 ug via INTRAVENOUS
  Administered 2013-02-06: 59.74 ug via INTRAVENOUS
  Administered 2013-02-06: 160 ug via INTRAVENOUS
  Filled 2013-02-05 (×2): qty 50

## 2013-02-05 MED ORDER — SODIUM CHLORIDE 0.9 % IV BOLUS (SEPSIS)
500.0000 mL | Freq: Once | INTRAVENOUS | Status: AC
  Administered 2013-02-05: 500 mL via INTRAVENOUS

## 2013-02-05 MED ORDER — SODIUM CHLORIDE 0.9 % IV SOLN
250.0000 mL | INTRAVENOUS | Status: DC
  Administered 2013-02-05: 250 mL via INTRAVENOUS

## 2013-02-05 MED ORDER — FAMOTIDINE IN NACL 20-0.9 MG/50ML-% IV SOLN
20.0000 mg | Freq: Two times a day (BID) | INTRAVENOUS | Status: DC
  Administered 2013-02-05 – 2013-02-07 (×5): 20 mg via INTRAVENOUS
  Filled 2013-02-05 (×6): qty 50

## 2013-02-05 MED ORDER — PROMETHAZINE HCL 25 MG/ML IJ SOLN
12.5000 mg | Freq: Four times a day (QID) | INTRAMUSCULAR | Status: DC | PRN
  Administered 2013-02-05: 12.5 mg via INTRAVENOUS
  Filled 2013-02-05: qty 1

## 2013-02-05 MED ORDER — FAMOTIDINE 20 MG PO TABS
20.0000 mg | ORAL_TABLET | Freq: Two times a day (BID) | ORAL | Status: DC
  Filled 2013-02-05 (×2): qty 1

## 2013-02-05 MED ORDER — SODIUM CHLORIDE 0.9 % IV SOLN
INTRAVENOUS | Status: DC
  Administered 2013-02-05: 16:00:00 via INTRAVENOUS

## 2013-02-05 MED ORDER — DIPHENHYDRAMINE HCL 12.5 MG/5ML PO ELIX: 12.5000 mg | ORAL_SOLUTION | Freq: Four times a day (QID) | ORAL | Status: DC | PRN

## 2013-02-05 MED FILL — Sodium Chloride IV Soln 0.9%: INTRAVENOUS | Qty: 1000 | Status: AC

## 2013-02-05 MED FILL — Heparin Sodium (Porcine) Inj 1000 Unit/ML: INTRAMUSCULAR | Qty: 30 | Status: AC

## 2013-02-05 NOTE — Evaluation (Signed)
Physical Therapy Evaluation Patient Details Name: Katelyn [name] MRN: [id number] DOB: [birth year] Today's Date: 02/05/2013 Time: 4098-1191 PT Time Calculation (min): 32 min  PT Assessment / Plan / Recommendation History of Present Illness  52 y/o female s/p PLIF L4-5, L5-S1.   Clinical Impression  Patient is s/p PLIF resulting in the deficits listed below (see PT Problem List).  Patient will benefit from skilled PT to increase their independence and safety with mobility (while adhering to their precautions) to allow discharge home.     PT Assessment  Patient needs continued PT services    Follow Up Recommendations  Home health PT;Supervision - Intermittent    Does the patient have the potential to tolerate intense rehabilitation      Barriers to Discharge        Equipment Recommendations  Rolling walker with 5" wheels    Recommendations for Other Services     Frequency Min 5X/week    Precautions / Restrictions Precautions Precautions: Back Required Braces or Orthoses: Spinal Brace Spinal Brace: Lumbar corset;Applied in sitting position   Pertinent Vitals/Pain Reports 5/10 back pain, PCA available; per RN pt very weak when standing with her earlier, BP WFL in sitting and standing, still complaining of lower extremity weakness during ambulation, close guarding was provided however pt functional and able to ambulate      Mobility  Bed Mobility Bed Mobility: Rolling Right;Right Sidelying to Sit Rolling Right: 5: Supervision Right Sidelying to Sit: 5: Supervision;HOB flat Details for Bed Mobility Assistance: verbal cues for log roll and safe mobility with back precautions Transfers Transfers: Sit to Stand;Stand to Sit Sit to Stand: 4: Min guard;With upper extremity assist Stand to Sit: 4: Min guard;With upper extremity assist Details for Transfer Assistance: cues for safe hand placement and body positioning Ambulation/Gait Ambulation/Gait Assistance: 4: Min  guard Ambulation Distance (Feet): 150 Feet Assistive device: Rolling walker Ambulation/Gait Assistance Details: cues for tall posture and safe positioning with RW Gait Pattern: Step-through pattern;Trunk flexed;Decreased stride length Stairs: No    Exercises     PT Diagnosis: Difficulty walking;Generalized weakness;Acute pain  PT Problem List: Decreased strength;Decreased activity tolerance;Decreased balance;Decreased knowledge of use of DME;Decreased knowledge of precautions PT Treatment Interventions: DME instruction;Gait training;Stair training;Functional mobility training;Therapeutic exercise;Therapeutic activities;Neuromuscular re-education;Patient/family education;Balance training     PT Goals(Current goals can be found in the care plan section) Acute Rehab PT Goals Patient Stated Goal: stronger PT Goal Formulation: With patient Time For Goal Achievement: 02/12/13 Potential to Achieve Goals: Good  Visit Information  Last PT Received On: 02/05/13 Assistance Needed: +1 History of Present Illness: 52 y/o female s/p PLIF L4-5, L5-S1.        Prior Functioning  Home Living Family/patient expects to be discharged to:: Private residence Living Arrangements: Parent;Children Available Help at Discharge: Family;Available 24 hours/day (supervision only) Type of Home: House Home Access: Stairs to enter [employer] of Steps: 2-3 Home Layout: Two level;Able to live on main level with bedroom/bathroom Home Equipment: None Prior Function Level of Independence: Independent Communication Communication: No difficulties    Cognition  Cognition Arousal/Alertness: Awake/alert Behavior During Therapy: WFL for tasks assessed/performed Overall Cognitive Status: Within Functional Limits for tasks assessed    Extremity/Trunk Assessment Upper Extremity Assessment Upper Extremity Assessment: Defer to OT evaluation Lower Extremity Assessment Lower Extremity Assessment:  Generalized weakness   Balance    End of Session PT - End of Session Equipment Utilized During Treatment: Gait belt Activity Tolerance: Patient tolerated treatment well Patient left: in chair;with call bell/phone within  reach Nurse Communication: Mobility status  GP     [facility] HELEN 02/05/2013, 3:47 PM

## 2013-02-05 NOTE — Op Note (Signed)
NAMEANTONISHA, Katelyn [name]             ACCOUNT NO.:  [id number]  MEDICAL RECORD NO.:  [id number]  LOCATION:  4N29C                        FACILITY:  [facility]  PHYSICIAN:  Hilda Lias, M.D.   DATE OF BIRTH:  1960/11/09  DATE OF PROCEDURE:  02/04/2013 DATE OF DISCHARGE:                              OPERATIVE REPORT   PREOPERATIVE DIAGNOSIS:  Degenerative disk disease, L4-L5, L5-S1 with chronic radiculopathy.  POSTOPERATIVE DIAGNOSIS:  Degenerative disk disease, L4-L5, L5-S1 with chronic radiculopathy.  PROCEDURE:  Bilateral L4-L5, L5-S1 diskectomy; decompression of the thecal sac; decompression of the L4-L5, L5-S1 nerve roots; interbody fusion with cages; pedicle screws L4, L5, S1; posterolateral arthrodesis; diskectomy beyond normal to be able to introduce the cage. Cell Saver.  C-arm.  SURGEON:  Hilda Lias, M.D.  ASSISTANT:  Reinaldo Meeker, M.D.  CLINICAL HISTORY:  The patient is a lady who had been complaining of back pain for almost 5 years.  She was seen in [place] [facility] [place] several years ago, and she was advised to have a lumbar fusion. Nevertheless, when I saw her about 6 years ago, we decided to go ahead with conservative treatment including physical therapy and Pain Clinic. Because of no improvement, we agreed with surgery.  Nevertheless, the insurance company declined.  She had been complaining of back pain to the rest of both legs.  We did a diskogram, which was negative at the level L3-L4, but highly positive for reducing the pain at L4-L5, L5-S1 with degenerative disk disease by CT discogram.  Because of that, we agreed to proceed with 2-level fusion.  The patient knew the risk with the surgery including the possibility of no improvement because of the chronicity of her pain.  PROCEDURE IN DETAIL:  The patient was taken to the OR, and after intubation, she was positioned in a prone manner.  The back was cleaned with DuraPrep.  Prior to that, we cleaned  with Betadine.  Drapes were applied.  Then, midline incision from L3-L4 down to L5-S1 was made, and muscles were retracted laterally.  X-ray proved that we were right at the level of L4-L5, L5-S1.  We proceeded with removing the spinous process of L4-L5, the laminae of both as well as the facet.  The patient had quite a bit of hypertrophied facet.  It was difficult to follow the normal anatomy.  Nevertheless, we were able to get into the disk space at L4-L5, first on the right side and then on the left side, and diskectomy was achieved bilaterally.  Because of the disk being so small, we were able to introduce 1 cage of 12 x 22.  At the level L5-S1, the area was quite narrow, but at the end, we worked our way, doing diskectomy bilaterally with introducing cage of 8 x 22.  The rest of the disk space was filled up with autograft.  This procedure was done with the help of the C-arm.  Then using the same C-arm in AP view and then a lateral view, we probed the pedicle of L4, L5, and S1.  We went into the use of 4 screws of 4.5 x 40, and 2 other of 4.5 x 50 at the level of  S1. Then, they were kept in place with rods of Capps.  A cross-link from right to left was used.  Then, we went laterally, and we removed the periosteum at the L4-L5, L5-S1, and a mix of autograft bone extender was used for arthrodesis.  We achieved good hemostasis, but nevertheless because the dissection, she had been taking Mobic, we left a drain in the epidural space.  Then, the wound was closed with Vicryl and Steri-Strips.          ______________________________ Hilda Lias, M.D.     EB/MEDQ  D:  02/04/2013  T:  02/05/2013  Job:  [id number]

## 2013-02-05 NOTE — Progress Notes (Addendum)
Pt. C/o "I feel like my throat is closing up." This Water engineer assessed pt.'s throat and noted ? Swelling. Sats 96% RA. Pt. Also c/o inability to get anything down. Voice mildly hoarse. Attempted to call anesthesia x3 but line was busy. Paged Dr. Jeral Fruit; also called [facility] to assist.  Received return call from Dr. Jeral Fruit who was notified of situation; d/w Dr. Jeral Fruit and then Dr. Danielle Dess who was on the unit and was asked to assess pt. Dr. Danielle Dess saw pt. And new orders were received. Will monitor.

## 2013-02-05 NOTE — Progress Notes (Signed)
   CARE MANAGEMENT NOTE 02/05/2013  Patient:  Katelyn [name], Katelyn [name]   Account Number:  [id number]  Date Initiated:  02/05/2013  Documentation initiated by:  Jiles Crocker  Subjective/Objective Assessment:   ADMITTED FOR SURGERY - decompression and fusion at l45 and l5s1     Action/Plan:   LIVES WITH FAMILY MEMBERS; CM FOLLOWING FOR DCP   Anticipated DC Date:  02/09/2013   Anticipated DC Plan:  HOME/SELF CARE      DC Planning Services  CM consult          Status of service:  In process, will continue to follow Medicare Important Message given?  NA - LOS <3 / Initial given by admissions (If response is "NO", the following Medicare IM given date fields will be blank)  Per UR Regulation:  Reviewed for med. necessity/level of care/duration of stay Comments:  02/05/2013- B Bear Osten RN,BSN,MHA

## 2013-02-05 NOTE — Consult Note (Addendum)
Reason for Consult: Hypotension and hyponatremia with mildly elevated LFTs. Referring Physician: Dr. Wynetta Emery. Neurosurgeon.  Katelyn [name] is an 52 y.o. female.  HPI: 52 year-old female with history of hypertension and hyperlipidemia and previous history of hepatitis C had lumbar fusion surgery yesterday. Patient was found to be hypotensive with showing hyponatremia and elevated creatinine and mildly elevated LFTs. Consult was called for further management. On exam at the bedside patient is sitting and not in distress. Earlier today patient had some throat constriction-like feeling which is improved and presently only has mild sore throat-like feeling. Still has abdominal discomfort with some nausea and earlier had vomiting. The abdominal pain is mostly in the lower part of the abdomen. Patient has not moved bowels or flatus since surgery. Acute abdominal series done did not stenting acute. Patient is on fentanyl PCA for pain control. Patient otherwise denies any chest pain shortness of breath and is not febrile and does not have any focal deficits headache or visual symptoms.  Past Medical History  Diagnosis Date  . Depression   . Panic attack   . Hypertension   . Hypercholesteremia   . Goiter   . PONV (postoperative nausea and vomiting)   . GERD (gastroesophageal reflux disease)   . Degenerative disc disease, lumbar   . Hepatitis   . Hepatitis C infection 2004    cured with treatment of interferon and ribavirin    Past Surgical History  Procedure Laterality Date  . Total shoulder arthroplasty    . Foot surgery    . Neck surgery    . Tonsillectomy    . Colonoscopy N/A 10/14/2012    Procedure: COLONOSCOPY;  Surgeon: Corbin Ade, MD;  Location: [facility];  Service: Endoscopy;  Laterality: N/A;  9:30 AM    Family History  Problem Relation Age of Onset  . Colon cancer Neg Hx     Social History:  reports that she has been smoking Cigarettes.  She has a 17.5 pack-year smoking  history. She has never used smokeless tobacco. She reports that she does not drink alcohol or use illicit drugs.  Allergies:  Allergies  Allergen Reactions  . Sulfa Antibiotics Nausea And Vomiting and Rash    Medications: I have reviewed the patient's current medications.  Results for orders placed during the hospital encounter of 02/04/13 (from the past 48 hour(s))  SURGICAL PCR SCREEN     Status: None   Collection Time    02/04/13  8:53 AM      Result Value Range   MRSA, PCR NEGATIVE  NEGATIVE   Staphylococcus aureus NEGATIVE  NEGATIVE   Comment:            The Xpert SA Assay (FDA     approved for NASAL specimens     in patients over 26 years of age),     is one component of     a comprehensive surveillance     program.  Test performance has     been validated by [employer] for patients greater     than or equal to 63 year old.     It is not intended     to diagnose infection nor to     guide or monitor treatment.  CBC     Status: Abnormal   Collection Time    02/04/13  5:53 PM      Result Value Range   WBC 16.8 (*) 4.0 - 10.5 K/uL  RBC 3.67 (*) 3.87 - 5.11 MIL/uL   Hemoglobin 11.7 (*) 12.0 - 15.0 g/dL   Comment: DELTA CHECK NOTED     REPEATED TO VERIFY   HCT 33.7 (*) 36.0 - 46.0 %   MCV 91.8  78.0 - 100.0 fL   MCH 31.9  26.0 - 34.0 pg   MCHC 34.7  30.0 - 36.0 g/dL   RDW 96.0  45.4 - 09.8 %   Platelets 298  150 - 400 K/uL  CBC     Status: Abnormal   Collection Time    02/05/13  5:56 PM      Result Value Range   WBC 13.7 (*) 4.0 - 10.5 K/uL   RBC 2.88 (*) 3.87 - 5.11 MIL/uL   Hemoglobin 9.3 (*) 12.0 - 15.0 g/dL   Comment: REPEATED TO VERIFY     DELTA CHECK NOTED   HCT 26.5 (*) 36.0 - 46.0 %   MCV 92.0  78.0 - 100.0 fL   MCH 32.3  26.0 - 34.0 pg   MCHC 35.1  30.0 - 36.0 g/dL   RDW 11.9  14.7 - 82.9 %   Platelets 242  150 - 400 K/uL  COMPREHENSIVE METABOLIC PANEL     Status: Abnormal   Collection Time    02/05/13  5:56 PM      Result Value Range    Sodium 128 (*) 135 - 145 mEq/L   Potassium 4.2  3.5 - 5.1 mEq/L   Comment: HEMOLYSIS AT THIS LEVEL MAY AFFECT RESULT   Chloride 95 (*) 96 - 112 mEq/L   CO2 23  19 - 32 mEq/L   Glucose, Bld 98  70 - 99 mg/dL   BUN 30 (*) 6 - 23 mg/dL   Creatinine, Ser 5.62 (*) 0.50 - 1.10 mg/dL   Calcium 7.7 (*) 8.4 - 10.5 mg/dL   Total Protein 6.2  6.0 - 8.3 g/dL   Albumin 3.1 (*) 3.5 - 5.2 g/dL   AST 66 (*) 0 - 37 U/L   ALT 22  0 - 35 U/L   Alkaline Phosphatase 71  39 - 117 U/L   Total Bilirubin 0.4  0.3 - 1.2 mg/dL   GFR calc non Af Amer 24 (*) >90 mL/min   GFR calc Af Amer 28 (*) >90 mL/min   Comment:            The eGFR has been calculated     using the CKD EPI equation.     This calculation has not been     validated in all clinical     situations.     eGFR's persistently     <90 mL/min signify     possible Chronic Kidney Disease.    Dg Lumbar Spine 2-3 Views  02/04/2013   *RADIOLOGY REPORT*  Clinical Data: 52 year old female undergoing lumbar spine surgery.  LUMBAR SPINE - 2-3 VIEW, DG C-ARM 1-60 MIN  Comparison: 1141 hours the same day and earlier.  Findings: Intraoperative fluoroscopic views in the AP and lateral projection.  Placement of bilateral L4, L5, and S1 transpedicular screws.  L4-L5 and L5-S1 interbody implant in place.  Evidence of decompression.  Connecting rods not yet in place.  IMPRESSION: Posterior and interbody fusion under way at L4-L5 and L5-S1.   Original Report Authenticated By: Erskine Speed, M.D.   Dg Lumbar Spine 1 View  02/04/2013   *RADIOLOGY REPORT*  Clinical Data: 52 year old female undergoing lumbar spine surgery.  LUMBAR SPINE - 1 VIEW  Comparison: CT discogram 12/19/2012.  Lumbar MRI 10/11/2012.  Findings: The same numbering system as on 12/19/2012.  Portable cross-table lateral intraoperative view of the lumbar spine labeled film #1 at 1141 hours.  Cephalad surgical probe at the L4 pedicle level.  Caudal surgical probe at the L5 pedicle level.  IMPRESSION:  Intraoperative localization as above.   Original Report Authenticated By: Erskine Speed, M.D.   Dg Abd Portable 1v  02/05/2013   *RADIOLOGY REPORT*  Clinical Data: P L I F 02/04/2013.  Nausea vomiting and abdominal distension.  PORTABLE ABDOMEN - 1 VIEW  Comparison: None.  Findings: Mildly distended transverse colon. Gas-filled loops of bowel the left upper quadrant are likely a combination of small and large bowel, with the largest representing the splenic flexure.  No abnormal intra-abdominal mass effect or calcification.  There are likely mild atelectatic changes at the bases, not unexpected postoperatively.  L4-S1 P L I F posterior rod and screw fixation.  IMPRESSION:   No evidence of high-grade obstruction.  If symptoms persist, recommend follow-up to ensure normal progression of bowel gas.   Original Report Authenticated By: Tiburcio Pea   Dg C-arm 1-60 Min  02/04/2013   *RADIOLOGY REPORT*  Clinical Data: 52 year old female undergoing lumbar spine surgery.  LUMBAR SPINE - 2-3 VIEW, DG C-ARM 1-60 MIN  Comparison: 1141 hours the same day and earlier.  Findings: Intraoperative fluoroscopic views in the AP and lateral projection.  Placement of bilateral L4, L5, and S1 transpedicular screws.  L4-L5 and L5-S1 interbody implant in place.  Evidence of decompression.  Connecting rods not yet in place.  IMPRESSION: Posterior and interbody fusion under way at L4-L5 and L5-S1.   Original Report Authenticated By: Erskine Speed, M.D.    Review of Systems  Constitutional: Positive for malaise/fatigue.  HENT: Positive for sore throat.   Eyes: Negative.   Respiratory: Negative.   Cardiovascular: Negative.   Gastrointestinal: Positive for nausea, vomiting and abdominal pain.  Genitourinary: Negative.   Musculoskeletal: Negative.   Skin: Negative.   Neurological: Negative.   Endo/Heme/Allergies: Negative.   Psychiatric/Behavioral: Negative.    Blood pressure 122/54, pulse 88, temperature 98 F (36.7 C),  temperature source Oral, resp. rate 18, height 5\' 4"  (1.626 m), weight 79.379 kg (175 lb), SpO2 100.00%. Physical Exam  Constitutional: She is oriented to person, place, and time. She appears well-developed and well-nourished. No distress.  HENT:  Head: Normocephalic and atraumatic.  Right Ear: External ear normal.  Left Ear: External ear normal.  Nose: Nose normal.  Mouth/Throat: Oropharynx is clear and moist. No oropharyngeal exudate.  Eyes: Conjunctivae are normal. Pupils are equal, round, and reactive to light. Right eye exhibits no discharge. Left eye exhibits no discharge. No scleral icterus.  Neck: Normal range of motion. Neck supple.  Cardiovascular: Normal rate and regular rhythm.   Respiratory: Effort normal and breath sounds normal. No respiratory distress. She has no wheezes. She has no rales.  GI: Soft. She exhibits distension. There is no tenderness. There is no rebound and no guarding.  Musculoskeletal: She exhibits no edema and no tenderness.  Neurological: She is alert and oriented to person, place, and time. No cranial nerve deficit. Coordination normal.  Skin: Skin is warm and dry. She is not diaphoretic.  Psychiatric: Her behavior is normal.    Assessment/Plan: #1. Hypotension - most likely secondary to volume loss and pain relief medications with antihypertensives. Patient is receiving 1 L normal saline bolus following which normal saline will be continued at  100 cc per hour. Patient does have anemia repeat CBC has been ordered and if there       is any further fall in hemoglobin we'll need transfusion. Patient at this time does not look septic. Hold antihypertensives. #2. Acute renal failure - probably secondary to volume loss and hypotension. See #1. Closely follow metabolic panel and intake output. #3. Abdominal pain with nausea and vomiting - abdominal exam is benign except for mild distention and bowel sounds are not heard clearly. Suspect this could be due to ileus.  At this time as per patient n.p.o. Get CT abdomen and pelvis with by mouth contrast only. Based on which we'll       have further plans. #4. Hyponatremia - probably from dehydration or dilation. Closely follow metabolic panel after hydration. #5. Anemia - either due to blood loss or dilution. Check CBC closely. #6. Mildly elevated LFTs - patient does have a history of hepatitis C which patient states has been treated many years ago. Closely follow LFTs for any further worsening by hypertension. Hold statins for now.  Patient had a sensation of throat constriction in the morning. Discussed with pharmacy and cephalosporin has been listed as allergy now.  Thank for involving [place] in patient's care. We'll closely follow along with you.  Mouhamed Glassco N. 02/05/2013, 10:30 PM

## 2013-02-05 NOTE — Progress Notes (Signed)
Patient ID: Katelyn [name], female   DOB: November 17, 1960, 52 y.o.   MRN: [id number] Having trouble swallowing,no weakness. Poor output. spoke with family

## 2013-02-05 NOTE — Progress Notes (Signed)
Foley cath left in place at this time secondary to poor output. Also, pt. C/o feeling very dizzy and weak when attempting to stand. Pt. Not safe at this time for ambulation even in room. Will closely monitor.

## 2013-02-05 NOTE — Progress Notes (Signed)
Patient ID: Katelyn [name], female   DOB: 08-05-1960, 52 y.o.   MRN: [id number] Called to see the patient for abnormal lab data the patient is apparently 1 days status post lumbar fusion who this morning was having some difficulty with her airway however this was resolved she was experiencing some nausea and some bilious vomiting although this is also resolved and some abdominal discomfort and normal postoperative back and leg pain. Currently the patient says back and legs hurt she is having a little bit of discomfort in her abdomen although it is better she is no longer nauseated but she did vomit 2 hours ago.  Exam: Patient is awake alert oriented strength is 5 of 5 in her lower extremities in her iliopsoas, quads, and she's got gastric some into tibialis, EHL. Her wound is clean and dry. Her abdomen is distended but soft she does not display any guarding and is only mildly tender. Her vital signs show show systolic blood pressures around 96 however in the one teens all day her heart rate is 90 she did however and 88-92 all day urine output is been improved today over yesterday she is to put at around 250 cc over 8 hour shift apparently she might have only placed out 100 cc over 8 hours yesterday.  Assessment plan 52 year old female who is postop day 1 from a lumbar fusion displacing some signs of acute renal failure with a creatinine of 2.2 which was up from 0.772 days ago with a crate with a BUN of 30 which is up from 17 2 days ago. Her sodium is 128 her hematocrit is 26 down from 33 yesterday. I checked a KUB which I think it shows some distended loops of that bowel I am awaiting the final radiologist's interpretation. I have consulted both [facility] or the acute renal failure and the [facility] to help with her hyponatremia and elevation of liver function tests. The elevation of her liver function tests is compared to last 2 years ago since unclear to me whether that is an acute change or  not.. She is currently on IV fluids at 125/hour. I have stopped her hydrochlorothiazide and have asked the pharmacy to renally dose her Cefazolin.

## 2013-02-05 NOTE — Progress Notes (Signed)
Noted abnormal lab results on this evening's labs. Paged results to Dr. Wynetta Emery; new orders received.

## 2013-02-05 NOTE — Clinical Social Work Note (Signed)
Clinical Social Worker received standing order referral for possible ST-SNF placement.  Chart reviewed.  PT/OT evaluations pending at this time.  Per RN, patient will not be appropriate for SNF at discharge.  Spoke with RN Case Manager who will follow up with patient to discuss home health needs vs. outpatient therapies if necessary at discharge.    CSW signing off - please re consult if social work needs arise.  Macario Golds, [place] [phone]

## 2013-02-05 NOTE — Evaluation (Signed)
I agree with the following treatment note after reviewing documentation.   Johnston, Lacinda Curvin Brynn   OTR/L Pager: [phone] Office: [phone] .   

## 2013-02-05 NOTE — Progress Notes (Signed)
Patient ID: Katelyn [name], female   DOB: Aug 21, 1960, 52 y.o.   MRN: [id number] Asked to see patient c/o respiratory distress. Rapid response called. Patient has Sa O2 of 96 on room air and resp rate of 18 . Co reflux sxs. Patient has had these before. Will add IV Pepcid as she does not feel comfortable with swallow. Throat and oral airway visualized and is normal.

## 2013-02-05 NOTE — Evaluation (Signed)
Occupational Therapy Evaluation Patient Details Name: Katelyn [name] MRN: [id number] DOB: 1960-08-31 Today's Date: 02/05/2013 Time: 4098-1191 OT Time Calculation (min): 29 min  OT Assessment / Plan / Recommendation History of present illness 52 y/o female s/p PLIF L4-5, L5-S1.    Clinical Impression   Patient is s/p PLIF resulting in the deficits listed below (see OT Problem List). Patient will benefit from skilled OT to increase their independence and safety with ADL (while adhering to their precautions) to allow discharge home with HHOT.     OT Assessment  Patient needs continued OT Services    Follow Up Recommendations  Home health OT       Equipment Recommendations  None recommended by OT       Frequency  Min 2X/week    Precautions / Restrictions Precautions Precautions: Back Precaution Booklet Issued: Yes (comment) Precaution Comments: BAT handout Required Braces or Orthoses: Spinal Brace Spinal Brace: Lumbar corset;Applied in sitting position Restrictions Weight Bearing Restrictions: No   Pertinent Vitals/Pain Pt reported 5/10 backpain. BP WFL sitting and static standing.     ADL  Eating/Feeding: NPO Grooming: Min guard Where Assessed - Grooming: Supported standing Upper Body Bathing: Minimal assistance Where Assessed - Upper Body Bathing: Supported sitting Lower Body Bathing: Maximal assistance Where Assessed - Lower Body Bathing: Supported sitting Upper Body Dressing: Minimal assistance Where Assessed - Upper Body Dressing: Supported sitting Lower Body Dressing: Maximal assistance Where Assessed - Lower Body Dressing: Supported sitting Toilet Transfer: Minimal assistance Statistician Method: Sit to Barista: Other (comment) (chair) Toileting - Clothing Manipulation and Hygiene: Maximal assistance Where Assessed - Engineer, mining and Hygiene: Standing Tub/Shower Transfer: Therapist, sports  Method: Science writer: Walk in shower;Grab bars Equipment Used: Back brace;Gait belt;Rolling walker Transfers/Ambulation Related to ADLs: min guard for transfers and supervision for ambulation ADL Comments: Pt with mod vc's to maintain precautions. Pt max assist for LB dressing. Pt still with catheter in. Next session to focus on sink level ADL an d toileting/peri care.    OT Diagnosis: Generalized weakness;Acute pain  OT Problem List: Decreased strength;Decreased activity tolerance;Decreased safety awareness;Decreased knowledge of use of DME or AE;Decreased knowledge of precautions;Pain OT Treatment Interventions: Self-care/ADL training;Therapeutic exercise;DME and/or AE instruction;Therapeutic activities;Patient/family education   OT Goals(Current goals can be found in the care plan section) Acute Rehab OT Goals Patient Stated Goal: stronger OT Goal Formulation: With patient Time For Goal Achievement: 02/19/13 Potential to Achieve Goals: Good ADL Goals Pt Will Perform Upper Body Dressing: with modified independence;sitting Pt Will Perform Lower Body Dressing: with supervision;sit to/from stand Pt Will Transfer to Toilet: with modified independence;ambulating Additional ADL Goal #1: Pt will recall and maintain 3/3 precautions during ADL  Visit Information  Last OT Received On: 02/05/13 Assistance Needed: +1 PT/OT Co-Evaluation/Treatment: Yes History of Present Illness: 52 y/o female s/p PLIF L4-5, L5-S1.        Prior Functioning     Home Living Family/patient expects to be discharged to:: Private residence Living Arrangements: Parent;Children Available Help at Discharge: Family;Available 24 hours/day Type of Home: House Home Access: Stairs to enter [employer] of Steps: 2-3 Home Layout: Two level;Able to live on main level with bedroom/bathroom Home Equipment: Grab bars - tub/shower Prior Function Level of Independence:  Independent Communication Communication: No difficulties Dominant Hand: Right         Vision/Perception Vision - History Patient Visual Report: No change from baseline   Cognition  Cognition Arousal/Alertness: Awake/alert Behavior During Therapy: [facility]  for tasks assessed/performed Overall Cognitive Status: Within Functional Limits for tasks assessed    Extremity/Trunk Assessment Upper Extremity Assessment Upper Extremity Assessment: Overall WFL for tasks assessed Lower Extremity Assessment Lower Extremity Assessment: Generalized weakness     Mobility Bed Mobility Bed Mobility: Rolling Right;Right Sidelying to Sit Rolling Right: 5: Supervision Right Sidelying to Sit: 5: Supervision;HOB flat Details for Bed Mobility Assistance: verbal cues for log roll and safe mobility with back precautions Transfers Transfers: Sit to Stand;Stand to Sit Sit to Stand: 4: Min guard;With upper extremity assist Stand to Sit: 4: Min guard;With upper extremity assist Details for Transfer Assistance: cues for safe hand placement and body positioning           End of Session OT - End of Session Equipment Utilized During Treatment: Gait belt;Rolling walker;Back brace Activity Tolerance: Patient tolerated treatment well Patient left: in chair;with call bell/phone within reach Nurse Communication: Mobility status;Precautions  GO     Sherryl Manges 02/05/2013, 4:01 PM

## 2013-02-05 NOTE — Consult Note (Signed)
Reason for Consult: Acute renal failure, hyponatremia Referring Physician: Donalee Citrin M.D.-neurosurgery  HPI:  52 year old Caucasian woman past medical history significant for hypertension (prior to admission on HCTZ/Diovan), chronic back pain (previously on Mobic), history of treated hepatitis C infection and a normal renal function 2 days ago (creatinine 0.8 on 02/03/13). She underwent L4-L5 and L5-S1 discectomy and fusion yesterday for DDD/chronic lower back pain. Had emergent labs this evening as a followup to complaints of a choking sensation without affecting breathing earlier this morning that was found to be consistent with reflux type symptoms. She had been hypotensive last night (SBP in the 80s) leading to intermittent low blood pressures throughout the day today (SBP in the 90s). Poor urine output noted from yesterday with some improvement today. Labs this evening is significant for creatinine rise to 2.2 with a hyponatremia of 128. Also noted on labs was a drop of her hemoglobin from 11.7 to 9.3 over the past 24 hours. She's undergoing evaluation of ileus due to abdominal findings-distention with nausea and bilious vomiting. No contrast exposure is noted, no nonsteroidal anti-inflammatory drug use noted and ARB/diuretics were held today. Her last dose of furosemide was yesterday in the evening. She denies any chest pain or shortness of breath, mentating appropriately and denies any dysgeusia/jerking.  Past Medical History  Diagnosis Date  . Depression   . Panic attack   . Hypertension   . Hypercholesteremia   . Goiter   . PONV (postoperative nausea and vomiting)   . GERD (gastroesophageal reflux disease)   . Degenerative disc disease, lumbar   . Hepatitis   . Hepatitis C infection 2004    cured with treatment of interferon and ribavirin    Past Surgical History  Procedure Laterality Date  . Total shoulder arthroplasty    . Foot surgery    . Neck surgery    . Tonsillectomy    .  Colonoscopy N/A 10/14/2012    Procedure: COLONOSCOPY;  Surgeon: Corbin Ade, MD;  Location: [facility];  Service: Endoscopy;  Laterality: N/A;  9:30 AM    Family History  Problem Relation Age of Onset  . Colon cancer Neg Hx     Social History:  reports that she has been smoking Cigarettes.  She has a 17.5 pack-year smoking history. She has never used smokeless tobacco. She reports that she does not drink alcohol or use illicit drugs.  Allergies:  Allergies  Allergen Reactions  . Sulfa Antibiotics Nausea And Vomiting and Rash    Medications:  Scheduled: . famotidine (PEPCID) IV  20 mg Intravenous Q12H  . fentaNYL   Intravenous Q4H  . FLUoxetine  40 mg Oral Daily  . mupirocin ointment   Nasal BID  . oxybutynin  5 mg Oral QHS  . simvastatin  20 mg Oral QPM  . sodium chloride  500 mL Intravenous Once  . sodium chloride  3 mL Intravenous Q12H    Results for orders placed during the hospital encounter of 02/04/13 (from the past 48 hour(s))  SURGICAL PCR SCREEN     Status: None   Collection Time    02/04/13  8:53 AM      Result Value Range   MRSA, PCR NEGATIVE  NEGATIVE   Staphylococcus aureus NEGATIVE  NEGATIVE   Comment:            The Xpert SA Assay (FDA     approved for NASAL specimens     in patients over 21 years of  age),     is one component of     a comprehensive surveillance     program.  Test performance has     been validated by [facility] for patients greater     than or equal to 72 year old.     It is not intended     to diagnose infection nor to     guide or monitor treatment.  CBC     Status: Abnormal   Collection Time    02/04/13  5:53 PM      Result Value Range   WBC 16.8 (*) 4.0 - 10.5 K/uL   RBC 3.67 (*) 3.87 - 5.11 MIL/uL   Hemoglobin 11.7 (*) 12.0 - 15.0 g/dL   Comment: DELTA CHECK NOTED     REPEATED TO VERIFY   HCT 33.7 (*) 36.0 - 46.0 %   MCV 91.8  78.0 - 100.0 fL   MCH 31.9  26.0 - 34.0 pg   MCHC 34.7  30.0 - 36.0 g/dL   RDW  16.1  09.6 - 04.5 %   Platelets 298  150 - 400 K/uL  CBC     Status: Abnormal   Collection Time    02/05/13  5:56 PM      Result Value Range   WBC 13.7 (*) 4.0 - 10.5 K/uL   RBC 2.88 (*) 3.87 - 5.11 MIL/uL   Hemoglobin 9.3 (*) 12.0 - 15.0 g/dL   Comment: REPEATED TO VERIFY     DELTA CHECK NOTED   HCT 26.5 (*) 36.0 - 46.0 %   MCV 92.0  78.0 - 100.0 fL   MCH 32.3  26.0 - 34.0 pg   MCHC 35.1  30.0 - 36.0 g/dL   RDW 40.9  81.1 - 91.4 %   Platelets 242  150 - 400 K/uL  COMPREHENSIVE METABOLIC PANEL     Status: Abnormal   Collection Time    02/05/13  5:56 PM      Result Value Range   Sodium 128 (*) 135 - 145 mEq/L   Potassium 4.2  3.5 - 5.1 mEq/L   Comment: HEMOLYSIS AT THIS LEVEL MAY AFFECT RESULT   Chloride 95 (*) 96 - 112 mEq/L   CO2 23  19 - 32 mEq/L   Glucose, Bld 98  70 - 99 mg/dL   BUN 30 (*) 6 - 23 mg/dL   Creatinine, Ser 7.82 (*) 0.50 - 1.10 mg/dL   Calcium 7.7 (*) 8.4 - 10.5 mg/dL   Total Protein 6.2  6.0 - 8.3 g/dL   Albumin 3.1 (*) 3.5 - 5.2 g/dL   AST 66 (*) 0 - 37 U/L   ALT 22  0 - 35 U/L   Alkaline Phosphatase 71  39 - 117 U/L   Total Bilirubin 0.4  0.3 - 1.2 mg/dL   GFR calc non Af Amer 24 (*) >90 mL/min   GFR calc Af Amer 28 (*) >90 mL/min   Comment:            The eGFR has been calculated     using the CKD EPI equation.     This calculation has not been     validated in all clinical     situations.     eGFR's persistently     <90 mL/min signify     possible Chronic Kidney Disease.    Dg Lumbar Spine 2-3 Views  02/04/2013   *RADIOLOGY REPORT*  Clinical Data: 52 year old  female undergoing lumbar spine surgery.  LUMBAR SPINE - 2-3 VIEW, DG C-ARM 1-60 MIN  Comparison: 1141 hours the same day and earlier.  Findings: Intraoperative fluoroscopic views in the AP and lateral projection.  Placement of bilateral L4, L5, and S1 transpedicular screws.  L4-L5 and L5-S1 interbody implant in place.  Evidence of decompression.  Connecting rods not yet in place.   IMPRESSION: Posterior and interbody fusion under way at L4-L5 and L5-S1.   Original Report Authenticated By: Erskine Speed, M.D.   Dg Lumbar Spine 1 View  02/04/2013   *RADIOLOGY REPORT*  Clinical Data: 52 year old female undergoing lumbar spine surgery.  LUMBAR SPINE - 1 VIEW  Comparison: CT discogram 12/19/2012.  Lumbar MRI 10/11/2012.  Findings: The same numbering system as on 12/19/2012.  Portable cross-table lateral intraoperative view of the lumbar spine labeled film #1 at 1141 hours.  Cephalad surgical probe at the L4 pedicle level.  Caudal surgical probe at the L5 pedicle level.  IMPRESSION: Intraoperative localization as above.   Original Report Authenticated By: Erskine Speed, M.D.   Dg Abd Portable 1v  02/05/2013   *RADIOLOGY REPORT*  Clinical Data: P L I F 02/04/2013.  Nausea vomiting and abdominal distension.  PORTABLE ABDOMEN - 1 VIEW  Comparison: None.  Findings: Mildly distended transverse colon. Gas-filled loops of bowel the left upper quadrant are likely a combination of small and large bowel, with the largest representing the splenic flexure.  No abnormal intra-abdominal mass effect or calcification.  There are likely mild atelectatic changes at the bases, not unexpected postoperatively.  L4-S1 P L I F posterior rod and screw fixation.  IMPRESSION:   No evidence of high-grade obstruction.  If symptoms persist, recommend follow-up to ensure normal progression of bowel gas.   Original Report Authenticated By: Tiburcio Pea   Dg C-arm 1-60 Min  02/04/2013   *RADIOLOGY REPORT*  Clinical Data: 52 year old female undergoing lumbar spine surgery.  LUMBAR SPINE - 2-3 VIEW, DG C-ARM 1-60 MIN  Comparison: 1141 hours the same day and earlier.  Findings: Intraoperative fluoroscopic views in the AP and lateral projection.  Placement of bilateral L4, L5, and S1 transpedicular screws.  L4-L5 and L5-S1 interbody implant in place.  Evidence of decompression.  Connecting rods not yet in place.  IMPRESSION:  Posterior and interbody fusion under way at L4-L5 and L5-S1.   Original Report Authenticated By: Erskine Speed, M.D.    Review of Systems  Constitutional: Negative.   HENT: Positive for sore throat. Negative for hearing loss, ear pain, nosebleeds, congestion, neck pain, tinnitus and ear discharge.   Respiratory: Positive for shortness of breath. Negative for cough, hemoptysis, sputum production, wheezing and stridor.        Dyspnea earlier today with a sensation of choking   Cardiovascular: Negative.   Gastrointestinal: Positive for heartburn, nausea, vomiting and abdominal pain. Negative for diarrhea, constipation, blood in stool and melena.  Genitourinary: Negative.   Musculoskeletal: Positive for back pain. Negative for myalgias, joint pain and falls.  Skin: Negative.   Neurological: Negative.  Negative for headaches.  Endo/Heme/Allergies: Negative.   Psychiatric/Behavioral: Negative.   All other systems reviewed and are negative.   Blood pressure 96/49, pulse 92, temperature 98 F (36.7 C), temperature source Oral, resp. rate 20, height 5\' 4"  (1.626 m), weight 79.379 kg (175 lb), SpO2 98.00%. Physical Exam  Nursing note and vitals reviewed. Constitutional: She is oriented to person, place, and time. She appears well-developed and well-nourished. No distress.  Does not  appear distressed but is obviously uncomfortable  HENT:  Head: Normocephalic and atraumatic.  Nose: Nose normal.  Mouth/Throat: No oropharyngeal exudate.  Dry oropharynx/oral mucosa. Oxygen via nasal cannula.  Eyes: Conjunctivae and EOM are normal. Pupils are equal, round, and reactive to light.  Neck: Normal range of motion. Neck supple. No JVD present. No tracheal deviation present. No thyromegaly present.  Cardiovascular: Normal rate, regular rhythm and normal heart sounds.  Exam reveals no friction rub.   No murmur heard. Respiratory: Effort normal and breath sounds normal. No respiratory distress. She has no  wheezes. She has no rales.  GI: Soft. Bowel sounds are normal. She exhibits distension. She exhibits no mass. There is tenderness. There is no rebound and no guarding.  The abdomen is tympanitic on percussion. No palpable mass or bladder. Bowel sounds are high-pitched. She exhibits tenderness on deep palpation.  Musculoskeletal: She exhibits no edema and no tenderness.  Lymphadenopathy:    She has no cervical adenopathy.  Neurological: She is alert and oriented to person, place, and time.  Skin: Skin is warm and dry. No rash noted. No erythema.  Psychiatric: She has a normal mood and affect. Her behavior is normal.    Assessment/Plan: 1. Acute renal failure: Based on the timeline of events, available data base and physical exam findings, I suspect that this is hemodynamically induced by her relative hypotension with renal hypoperfusion injury plus/minus evolution to ATN. Unfortunately, her urine output is marginal at this time and will need to be vigilant to avoid tipping her over into volume overload given current fluid support for hypotension. No acute electrolyte or clinical needs are noted for hemodialysis. We'll check a urinalysis, urine electrolytes and renal ultrasound. She has a Foley catheter in situ that would allow strict input/output monitoring. No obvious contrast exposure/nephrotoxins. I have added a differential count to the CBC drawn this evening to corroborate possible allergic type reaction/AIN. Further workup will be based on the urinary findings i.e. glomerulonephritis workup. Will monitor the patient closely with you and intervene or adjust therapy as indicated by changes in clinical status/labs. 2. Hyponatremia: Likely from acute renal failure/impaired renal water handling and relative hypotonic fluid intake versus nausea/vomiting associated ADH release. Agree with IV fluids and oral fluid restriction for now particularly ileus is suspected. I do not think that her acutely elevated  LFTs (only AST) have any contributory effect to her hyponatremia at present. 3. Hypotension: Suspected this is likely from ongoing narcotic use rather than sepsis. Support with intravenous fluids and hold antihypertensive therapy at this time. 4. Status post L4-L5 and L5-S1 laminectomy/fusion-per neurosurgery.   Dondra Rhett K. 02/05/2013, 9:09 PM

## 2013-02-05 NOTE — Progress Notes (Signed)
Asked to assist with patient who staff reports c/o "throat closing up"  Reports that patient is alert - talking - no stridor heard - BP and O2 sats stable - but has difficulty swallowing. RRT RN  With another critical patient - asked patient's RN Victorino Dike  to stat page MD - called RT to floor to assist - and I will be there ASAP.  On my arrival at 0900 - patient sitting on side of bed - no resp distress - speaking full sentences - warm and dry - O2 sats at 96% - Patient states she has been throwing up all night - feels burning sensation in upper chest/throat area - diffuculty swallowing secondary to burning - Dr. Danielle Dess to room - patient on PPI pre-op - she states it feels like her "heartburn" - VSS - orders per MD.  Staff to recall if needed.

## 2013-02-06 ENCOUNTER — Inpatient Hospital Stay (HOSPITAL_COMMUNITY): Payer: Medicaid Other

## 2013-02-06 ENCOUNTER — Encounter (HOSPITAL_COMMUNITY): Payer: Self-pay | Admitting: Radiology

## 2013-02-06 DIAGNOSIS — F329 Major depressive disorder, single episode, unspecified: Secondary | ICD-10-CM

## 2013-02-06 LAB — URINE MICROSCOPIC-ADD ON

## 2013-02-06 LAB — URINALYSIS, ROUTINE W REFLEX MICROSCOPIC
Glucose, UA: NEGATIVE mg/dL
Specific Gravity, Urine: 1.014 (ref 1.005–1.030)
pH: 5.5 (ref 5.0–8.0)

## 2013-02-06 LAB — SODIUM, URINE, RANDOM: Sodium, Ur: 10 mEq/L

## 2013-02-06 LAB — CBC WITH DIFFERENTIAL/PLATELET
Basophils Absolute: 0 10*3/uL (ref 0.0–0.1)
Lymphocytes Relative: 17 % (ref 12–46)
Lymphs Abs: 1.7 10*3/uL (ref 0.7–4.0)
MCV: 92.5 fL (ref 78.0–100.0)
Neutro Abs: 6.9 10*3/uL (ref 1.7–7.7)
Platelets: 208 10*3/uL (ref 150–400)
RBC: 2.68 MIL/uL — ABNORMAL LOW (ref 3.87–5.11)
RDW: 13 % (ref 11.5–15.5)
WBC: 9.6 10*3/uL (ref 4.0–10.5)

## 2013-02-06 LAB — HEPATIC FUNCTION PANEL
ALT: 25 U/L (ref 0–35)
Bilirubin, Direct: 0.1 mg/dL (ref 0.0–0.3)
Indirect Bilirubin: 0.3 mg/dL (ref 0.3–0.9)
Total Protein: 5.8 g/dL — ABNORMAL LOW (ref 6.0–8.3)

## 2013-02-06 LAB — PROTEIN / CREATININE RATIO, URINE
Creatinine, Urine: 151.5 mg/dL
Protein Creatinine Ratio: 0.12 (ref 0.00–0.15)
Total Protein, Urine: 17.9 mg/dL

## 2013-02-06 LAB — CREATININE, URINE, RANDOM: Creatinine, Urine: 148.89 mg/dL

## 2013-02-06 LAB — BASIC METABOLIC PANEL
CO2: 24 mEq/L (ref 19–32)
Calcium: 7.7 mg/dL — ABNORMAL LOW (ref 8.4–10.5)
Chloride: 98 mEq/L (ref 96–112)
Creatinine, Ser: 1.29 mg/dL — ABNORMAL HIGH (ref 0.50–1.10)
GFR calc Af Amer: 54 mL/min — ABNORMAL LOW (ref >90)
Sodium: 132 mEq/L — ABNORMAL LOW (ref 135–145)

## 2013-02-06 LAB — TROPONIN I: Troponin I: <0.3 ng/mL (ref <0.30)

## 2013-02-06 MED ORDER — IOHEXOL 300 MG/ML  SOLN
25.0000 mL | INTRAMUSCULAR | Status: AC
  Administered 2013-02-06 (×2): 25 mL via ORAL

## 2013-02-06 NOTE — Progress Notes (Signed)
Physical Therapy Treatment Patient Details Name: Katelyn [name] MRN: [id number] DOB: [birth year] Today's Date: 02/06/2013 Time: 7425-9563 PT Time Calculation (min): 29 min  PT Assessment / Plan / Recommendation  PT Comments   More anxious today requiring calming cues throughout session. Educated patient on relaxation techniques. Needs to rest. Has been very stressed with her medical situation. Ambulating OK however bed mobility more difficult today because of pain and anxiety. Reviewed safe mobility. Hopeful she will progress faster once medical issues resolved.   Follow Up Recommendations  Home health PT;Supervision for mobility/OOB     Does the patient have the potential to tolerate intense rehabilitation     Barriers to Discharge        Equipment Recommendations  Rolling walker with 5" wheels    Recommendations for Other Services    Frequency Min 5X/week   Progress towards PT Goals Progress towards PT goals: Progressing toward goals  Plan Current plan remains appropriate    Precautions / Restrictions Precautions Precautions: Back Precaution Booklet Issued: Yes (comment) Precaution Comments: BAT handout Required Braces or Orthoses: Spinal Brace Spinal Brace: Lumbar corset;Applied in sitting position Restrictions Weight Bearing Restrictions: No   Pertinent Vitals/Pain Reports moderate back pain with all transitional movements as well as lower extremity weakness, repositioned and assisted back to bed following our session to allow for rest    Mobility  Bed Mobility Bed Mobility: Rolling Left;Left Sidelying to Sit;Sit to Sidelying Left Rolling Left: 5: Supervision Left Sidelying to Sit: 3: Mod assist Sit to Sidelying Left: 3: Mod assist Details for Bed Mobility Assistance: verbal cues for log roll and safe mobility with back precautions, mod facilitation to elevate trunk today, pt tensing up with pain needing assist for follow through and technique; modA to elevate legs and  sequence getting back to bed; verbal cues throughout to relax and breathe through the movement Transfers Transfers: Sit to Stand;Stand to Sit Sit to Stand: 4: Min assist;From bed Stand to Sit: 4: Min guard;To bed Details for Transfer Assistance: cues for hand placement and technique, facilitation for follow through, cues to calm down Ambulation/Gait Ambulation/Gait Assistance: 4: Min guard Ambulation Distance (Feet): 150 Feet Assistive device: Rolling walker Ambulation/Gait Assistance Details: cues for tall posture, initially very impulsive and anxious requiring verbal cues to relax and breathe so as to improve safety during ambulation, this improved with verbal cues Gait Pattern: Step-through pattern;Trunk flexed;Decreased stride length Stairs: No      PT Goals (current goals can now be found in the care plan section) Acute Rehab PT Goals Patient Stated Goal: stronger  Visit Information  Last PT Received On: 02/06/13 Assistance Needed: +1 History of Present Illness: 52 y/o female s/p PLIF L4-5, L5-S1.     Subjective Data  Patient Stated Goal: stronger   Cognition  Cognition Arousal/Alertness: Awake/alert Behavior During Therapy: WFL for tasks assessed/performed Overall Cognitive Status: Within Functional Limits for tasks assessed    Balance     End of Session PT - End of Session Equipment Utilized During Treatment: Gait belt Activity Tolerance: Patient tolerated treatment well;Patient limited by fatigue Patient left: in bed;with call bell/phone within reach Nurse Communication: Mobility status   GP     [facility] HELEN 02/06/2013, 11:53 AM

## 2013-02-06 NOTE — Progress Notes (Signed)
Help appreciated. Doing much better than yesterday. No more nausea or vomiting. Abdomen soft, bs present. C/o incisional pain. hemovac with serous drain. Foley in place with yellowish urine. bp 120/54. Pulse 103. Hb 8.4 woth hematocrit of 24.8. sodiun 132.potassioum 3.8.plan to remove hemovac drain. Foley up to nephrology. Fentanyl helping with the pain. Will star diet

## 2013-02-06 NOTE — Progress Notes (Signed)
Subjective:  Reportedly improved- for our purposes UOP good and creatinine back down.  Pt still having pain and no flatus Objective Vital signs in last 24 hours: Filed Vitals:   02/06/13 0247 02/06/13 0406 02/06/13 0531 02/06/13 0640  BP: 103/43  118/44   Pulse:   103   Temp:   98.7 F (37.1 C)   TempSrc:   Oral   Resp:  22  22  Height:      Weight:      SpO2:  97% 100% 99%   Weight change:   Intake/Output Summary (Last 24 hours) at 02/06/13 0928 Last data filed at 02/06/13 0654  Gross per 24 hour  Intake      0 ml  Output   1625 ml  Net  -1625 ml   Labs: Basic Metabolic Panel:  Recent Labs Lab 02/03/13 1619 02/05/13 1756 02/06/13 0531  NA 138 128* 132*  K 3.5 4.2 3.8  CL 98 95* 98  CO2 29 23 24   GLUCOSE 93 98 93  BUN 13 30* 24*  CREATININE 0.77 2.21* 1.29*  CALCIUM 9.8 7.7* 7.7*   Liver Function Tests:  Recent Labs Lab 02/05/13 1756 02/06/13 0531  AST 66* 74*  ALT 22 25  ALKPHOS 71 71  BILITOT 0.4 0.4  PROT 6.2 5.8*  ALBUMIN 3.1* 3.1*   No results found for this basename: LIPASE, AMYLASE,  in the last 168 hours No results found for this basename: AMMONIA,  in the last 168 hours CBC:  Recent Labs Lab 02/03/13 1619 02/04/13 1753 02/05/13 1756 02/05/13 2306 02/06/13 0531  WBC 8.3 16.8* 13.7* 9.5 9.6  NEUTROABS  --   --  11.1* 7.1 6.9  HGB 14.7 11.7* 9.3* 9.4* 8.4*  HCT 40.8 33.7* 26.5* 27.9* 24.8*  MCV 90.7 91.8 92.0 92.7 92.5  PLT 301 298 242 202 208   Cardiac Enzymes:  Recent Labs Lab 02/05/13 2306  TROPONINI <0.30   CBG: No results found for this basename: GLUCAP,  in the last 168 hours  Iron Studies: No results found for this basename: IRON, TIBC, TRANSFERRIN, FERRITIN,  in the last 72 hours Studies/Results: Dg Lumbar Spine 2-3 Views  02/04/2013   *RADIOLOGY REPORT*  Clinical Data: 51 year old female undergoing lumbar spine surgery.  LUMBAR SPINE - 2-3 VIEW, DG C-ARM 1-60 MIN  Comparison: 1141 hours the same day and earlier.   Findings: Intraoperative fluoroscopic views in the AP and lateral projection.  Placement of bilateral L4, L5, and S1 transpedicular screws.  L4-L5 and L5-S1 interbody implant in place.  Evidence of decompression.  Connecting rods not yet in place.  IMPRESSION: Posterior and interbody fusion under way at L4-L5 and L5-S1.   Original Report Authenticated By: Erskine Speed, M.D.   Dg Lumbar Spine 1 View  02/04/2013   *RADIOLOGY REPORT*  Clinical Data: 52 year old female undergoing lumbar spine surgery.  LUMBAR SPINE - 1 VIEW  Comparison: CT discogram 12/19/2012.  Lumbar MRI 10/11/2012.  Findings: The same numbering system as on 12/19/2012.  Portable cross-table lateral intraoperative view of the lumbar spine labeled film #1 at 1141 hours.  Cephalad surgical probe at the L4 pedicle level.  Caudal surgical probe at the L5 pedicle level.  IMPRESSION: Intraoperative localization as above.   Original Report Authenticated By: Erskine Speed, M.D.   US Renal  02/05/2013   *RADIOLOGY REPORT*  Clinical Data: Acute renal failure.  RENAL/URINARY TRACT ULTRASOUND COMPLETE  Comparison:  CT 04/30/2011  Findings:  Right Kidney:  Right kidney  could not be visualized due to the patient's pain and positioning.  Left Kidney:  Normal appearance of the left kidney.  Left kidney measures 11.0 cm in length without hydronephrosis.  Bladder:  The patient has a Foley catheter and the bladder is decompressed.  IMPRESSION: Normal appearance of left kidney.  Right kidney could not be visualized.   Original Report Authenticated By: Richarda Overlie, M.D.   Dg Abd Portable 1v  02/05/2013   *RADIOLOGY REPORT*  Clinical Data: P L I F 02/04/2013.  Nausea vomiting and abdominal distension.  PORTABLE ABDOMEN - 1 VIEW  Comparison: None.  Findings: Mildly distended transverse colon. Gas-filled loops of bowel the left upper quadrant are likely a combination of small and large bowel, with the largest representing the splenic flexure.  No abnormal intra-abdominal  mass effect or calcification.  There are likely mild atelectatic changes at the bases, not unexpected postoperatively.  L4-S1 P L I F posterior rod and screw fixation.  IMPRESSION:   No evidence of high-grade obstruction.  If symptoms persist, recommend follow-up to ensure normal progression of bowel gas.   Original Report Authenticated By: Tiburcio Pea   Dg C-arm 1-60 Min  02/04/2013   *RADIOLOGY REPORT*  Clinical Data: 51 year old female undergoing lumbar spine surgery.  LUMBAR SPINE - 2-3 VIEW, DG C-ARM 1-60 MIN  Comparison: 1141 hours the same day and earlier.  Findings: Intraoperative fluoroscopic views in the AP and lateral projection.  Placement of bilateral L4, L5, and S1 transpedicular screws.  L4-L5 and L5-S1 interbody implant in place.  Evidence of decompression.  Connecting rods not yet in place.  IMPRESSION: Posterior and interbody fusion under way at L4-L5 and L5-S1.   Original Report Authenticated By: Erskine Speed, M.D.   Medications: Infusions: . sodium chloride 75 mL/hr at 02/04/13 1604  . sodium chloride 250 mL (02/05/13 1119)  . sodium chloride 125 mL/hr at 02/05/13 1601  . sodium chloride 100 mL/hr at 02/06/13 0451    Scheduled Medications: . famotidine (PEPCID) IV  20 mg Intravenous Q12H  . fentaNYL   Intravenous Q4H  . FLUoxetine  40 mg Oral Daily  . iohexol  25 mL Oral Q1 Hr x 2  . mupirocin ointment   Nasal BID  . oxybutynin  5 mg Oral QHS  . sodium chloride  3 mL Intravenous Q12H    have reviewed scheduled and prn medications.  Physical Exam: General: alert, smiling working with OT Heart:RRR Lungs: mostly clear Abdomen: distended Extremities: no edema   Assessment/Plan:  1. Acute renal failure:  I suspect that this was hemodynamically induced by her relative hypotension with renal hypoperfusion injury plus/minus evolution to ATN. No obvious contrast exposure/nephrotoxins. Renal ultrasound showed normal left kidney but could not visualize right secondary to  positioning and pain. U/A unremarkable. Definitely seems to have turned the corner already with good UOP and improvement of all labs.  Will d/c foley and have ordered labs for AM to confirm complete resolution.  2. Hyponatremia: Likely from acute renal failure/impaired renal water handling and relative hypotonic fluid intake versus nausea/vomiting associated ADH release.  Is better 3. Hypotension: Suspected this is likely from ongoing narcotic use rather than sepsis. Supporting with intravenous fluids and hold antihypertensive therapy at this time. Is better also 4. Status post L4-L5 and L5-S1 laminectomy/fusion-per neurosurgery.     Nikka Hakimian A   02/06/2013,9:28 AM  LOS: 2 days

## 2013-02-06 NOTE — Progress Notes (Signed)
I agree with the following treatment note after reviewing documentation.   Johnston, Oletha Tolson Brynn   OTR/L Pager: [phone] Office: [phone] .   

## 2013-02-06 NOTE — Progress Notes (Signed)
38 ml fentanyl wasted in sink witnessed by Morrie Sheldon, Charity fundraiser

## 2013-02-06 NOTE — Progress Notes (Signed)
Occupational Therapy Treatment Patient Details Name: Katelyn [name] MRN: [id number] DOB: 23-Jul-1961 Today's Date: 02/06/2013 Time: 7829-5621 OT Time Calculation (min): 28 min  OT Assessment / Plan / Recommendation  OT comments  Pt progressing towards goals. Pt with incr pain this session, so Pt educated on "hip kit" AE, Pt will need this seeing as she wears orthotic inserts and cannot bring her feet to her knees. Pt practicing using AE. Pt will continue to benefit from OT in the acute setting before d/c home with HHOT.   Follow Up Recommendations  Home health OT       Equipment Recommendations   (Pt has been educated on how to get a hip kit (AE))       Frequency Min 2X/week   Progress towards OT Goals Progress towards OT goals: Progressing toward goals  Plan Discharge plan remains appropriate    Precautions / Restrictions Precautions Precautions: Back Precaution Booklet Issued: Yes (comment) Precaution Comments: BAT handout Required Braces or Orthoses: Spinal Brace Spinal Brace: Lumbar corset;Applied in sitting position Restrictions Weight Bearing Restrictions: No   Pertinent Vitals/Pain Pt reported 7/10 pain. Pt received medication when OTS entered room.    ADL  Eating/Feeding: NPO Grooming: Min guard Where Assessed - Grooming: Supported sitting Lower Body Bathing: Other (comment);Simulated;Min guard (with AE) Where Assessed - Lower Body Bathing: Supported sitting Lower Body Dressing: Minimal assistance;Other (comment) (with AE) Where Assessed - Lower Body Dressing: Supported sitting Toilet Transfer: Simulated;Minimal assistance Toilet Transfer Method: Sit to stand Equipment Used: Back brace;Long-handled shoe horn;Long-handled sponge;Reacher;Sock aid;Other (comment) (toileting aid) Transfers/Ambulation Related to ADLs: min guard for sit <> stand ADL Comments: Pt edcuated on AE for toileting, LB dressing/bathing. Pt Practiced LB dressing with AE and was min assist. Pt  educated on cup method when brushing teeth at the sink (Pt perf sitting down). Pt also educated to set items up on the right side of her body to prevent twisting. Pt educated about moving every 45 min while awake.     OT Goals(current goals can now be found in the care plan section) Acute Rehab OT Goals Patient Stated Goal: stronger OT Goal Formulation: With patient Time For Goal Achievement: 02/19/13 Potential to Achieve Goals: Good ADL Goals Pt Will Perform Upper Body Dressing: with modified independence;sitting Pt Will Perform Lower Body Dressing: with supervision;sit to/from stand Pt Will Transfer to Toilet: with modified independence;ambulating Additional ADL Goal #1: Pt will recall and maintain 3/3 precautions during ADL  Visit Information  Last OT Received On: 02/06/13 Assistance Needed: +1 History of Present Illness: 52 y/o female s/p PLIF L4-5, L5-S1.           Cognition  Cognition Arousal/Alertness: Awake/alert Behavior During Therapy: WFL for tasks assessed/performed Overall Cognitive Status: Within Functional Limits for tasks assessed    Mobility  Bed Mobility Bed Mobility: Not assessed Transfers Transfers: Sit to Stand;Stand to Sit Sit to Stand: 4: Min guard;With upper extremity assist;From chair/3-in-1 Stand to Sit: 4: Min guard;With upper extremity assist;To chair/3-in-1 Details for Transfer Assistance: cues for safe hand placement and body positioning          End of Session OT - End of Session Equipment Utilized During Treatment: Gait belt;Back brace Activity Tolerance: Patient limited by pain Patient left: in chair;with call bell/phone within reach Nurse Communication: Mobility status;Precautions  GO     Sherryl Manges 02/06/2013, 10:46 AM

## 2013-02-06 NOTE — Progress Notes (Signed)
Patient ID: Katelyn [name]  female  ZOX:[id number]    DOB: 1961/03/16    DOA: 02/04/2013  PCP: Cassell Smiles., MD  Assessment/Plan: Active Problems:   Hypotension, unspecified   ARF (acute renal failure)   Hyponatremia   Abdominal pain  Hypotension - resolved, -  likely secondary to hypovolemia, narcotics with antihypertensives.  - Continue IV fluids  - Hold antihypertensives. Does not need any transfusion for now.   Acute renal failure - resolved, probably secondary to volume loss and hypotension.  - Renal ultrasound did not show any hydronephrosis or obstruction   Abdominal pain with nausea and vomiting - improving  - CT abdomen and pelvis pending, if no PSBO, ileus or acute abdominal pathology, will start clear liquid diet and advance slowly  Hyponatremia - probably from dehydration, recent surgery, improving - Continue IV fluids    Anemia - either due to blood loss or dilution. - Hemoglobin mildly down to 8.4, transfuse if less than 7.5  Mildly elevated LFTs - patient does have a history of hepatitis C which patient states has been treated many years ago, could be elevated due to hypotension.  - follow LFTs for any further worsening, Hold statins for now.    status post L4-L5 and L5-S1 laminectomy/fusion: Per primary team, neurosurgery  DVT Prophylaxis:  Code Status:  Disposition:    Subjective: Feeling a whole lot better today, states nausea has improved wants to work with PT  Objective: Weight change:   Intake/Output Summary (Last 24 hours) at 02/06/13 1153 Last data filed at 02/06/13 0654  Gross per 24 hour  Intake      0 ml  Output   1625 ml  Net  -1625 ml   Blood pressure 118/44, pulse 103, temperature 98.7 F (37.1 C), temperature source Oral, resp. rate 22, height 5\' 4"  (1.626 m), weight 79.379 kg (175 lb), SpO2 99.00%.  Physical Exam: General: Alert and awake, oriented x3, not in any acute distress. CVS: S1-S2 clear, no murmur rubs or  gallops Chest: clear to auscultation bilaterally, no wheezing, rales or rhonchi Abdomen: soft nontender, mildly distended, BS+  Extremities: no cyanosis, clubbing or edema noted bilaterally   Lab Results: Basic Metabolic Panel:  Recent Labs Lab 02/05/13 1756 02/06/13 0531  NA 128* 132*  K 4.2 3.8  CL 95* 98  CO2 23 24  GLUCOSE 98 93  BUN 30* 24*  CREATININE 2.21* 1.29*  CALCIUM 7.7* 7.7*   Liver Function Tests:  Recent Labs Lab 02/05/13 1756 02/06/13 0531  AST 66* 74*  ALT 22 25  ALKPHOS 71 71  BILITOT 0.4 0.4  PROT 6.2 5.8*  ALBUMIN 3.1* 3.1*   No results found for this basename: LIPASE, AMYLASE,  in the last 168 hours No results found for this basename: AMMONIA,  in the last 168 hours CBC:  Recent Labs Lab 02/05/13 2306 02/06/13 0531  WBC 9.5 9.6  NEUTROABS 7.1 6.9  HGB 9.4* 8.4*  HCT 27.9* 24.8*  MCV 92.7 92.5  PLT 202 208   Cardiac Enzymes:  Recent Labs Lab 02/05/13 2306  TROPONINI <0.30   BNP: No components found with this basename: POCBNP,  CBG: No results found for this basename: GLUCAP,  in the last 168 hours   Micro Results: Recent Results (from the past 240 hour(s))  SURGICAL PCR SCREEN     Status: None   Collection Time    02/04/13  8:53 AM      Result Value Range Status   MRSA, PCR NEGATIVE  NEGATIVE Final   Staphylococcus aureus NEGATIVE  NEGATIVE Final   Comment:            The Xpert SA Assay (FDA     approved for NASAL specimens     in patients over 7 years of age),     is one component of     a comprehensive surveillance     program.  Test performance has     been validated by [employer] for patients greater     than or equal to 74 year old.     It is not intended     to diagnose infection nor to     guide or monitor treatment.    Studies/Results: Dg Chest 2 View  02/03/2013   *RADIOLOGY REPORT*  Clinical Data: Preop for lumbar surgery  CHEST - 2 VIEW  Comparison: 04/26/2011  Findings: Cardiomediastinal  silhouette is stable.  No acute infiltrate or pleural effusion.  No pulmonary edema.  Metallic fixation plate noted cervical spine.  Bony thorax is unremarkable. Old right rib fractures.  IMPRESSION: No active disease.   Original Report Authenticated By: Natasha Mead, M.D.   Dg Lumbar Spine 2-3 Views  02/04/2013   *RADIOLOGY REPORT*  Clinical Data: 52 year old female undergoing lumbar spine surgery.  LUMBAR SPINE - 2-3 VIEW, DG C-ARM 1-60 MIN  Comparison: 1141 hours the same day and earlier.  Findings: Intraoperative fluoroscopic views in the AP and lateral projection.  Placement of bilateral L4, L5, and S1 transpedicular screws.  L4-L5 and L5-S1 interbody implant in place.  Evidence of decompression.  Connecting rods not yet in place.  IMPRESSION: Posterior and interbody fusion under way at L4-L5 and L5-S1.   Original Report Authenticated By: Erskine Speed, M.D.   Dg Lumbar Spine 1 View  02/04/2013   *RADIOLOGY REPORT*  Clinical Data: 53 year old female undergoing lumbar spine surgery.  LUMBAR SPINE - 1 VIEW  Comparison: CT discogram 12/19/2012.  Lumbar MRI 10/11/2012.  Findings: The same numbering system as on 12/19/2012.  Portable cross-table lateral intraoperative view of the lumbar spine labeled film #1 at 1141 hours.  Cephalad surgical probe at the L4 pedicle level.  Caudal surgical probe at the L5 pedicle level.  IMPRESSION: Intraoperative localization as above.   Original Report Authenticated By: Erskine Speed, M.D.   US Renal  02/05/2013   *RADIOLOGY REPORT*  Clinical Data: Acute renal failure.  RENAL/URINARY TRACT ULTRASOUND COMPLETE  Comparison:  CT 04/30/2011  Findings:  Right Kidney:  Right kidney could not be visualized due to the patient's pain and positioning.  Left Kidney:  Normal appearance of the left kidney.  Left kidney measures 11.0 cm in length without hydronephrosis.  Bladder:  The patient has a Foley catheter and the bladder is decompressed.  IMPRESSION: Normal appearance of left kidney.   Right kidney could not be visualized.   Original Report Authenticated By: Richarda Overlie, M.D.   Dg Abd Portable 1v  02/05/2013   *RADIOLOGY REPORT*  Clinical Data: P L I F 02/04/2013.  Nausea vomiting and abdominal distension.  PORTABLE ABDOMEN - 1 VIEW  Comparison: None.  Findings: Mildly distended transverse colon. Gas-filled loops of bowel the left upper quadrant are likely a combination of small and large bowel, with the largest representing the splenic flexure.  No abnormal intra-abdominal mass effect or calcification.  There are likely mild atelectatic changes at the bases, not unexpected postoperatively.  L4-S1 P L I F posterior rod and screw fixation.  IMPRESSION:   No evidence of high-grade obstruction.  If symptoms persist, recommend follow-up to ensure normal progression of bowel gas.   Original Report Authenticated By: Tiburcio Pea   Dg C-arm 1-60 Min  02/04/2013   *RADIOLOGY REPORT*  Clinical Data: 52 year old female undergoing lumbar spine surgery.  LUMBAR SPINE - 2-3 VIEW, DG C-ARM 1-60 MIN  Comparison: 1141 hours the same day and earlier.  Findings: Intraoperative fluoroscopic views in the AP and lateral projection.  Placement of bilateral L4, L5, and S1 transpedicular screws.  L4-L5 and L5-S1 interbody implant in place.  Evidence of decompression.  Connecting rods not yet in place.  IMPRESSION: Posterior and interbody fusion under way at L4-L5 and L5-S1.   Original Report Authenticated By: Erskine Speed, M.D.    Medications: Scheduled Meds: . famotidine (PEPCID) IV  20 mg Intravenous Q12H  . fentaNYL   Intravenous Q4H  . FLUoxetine  40 mg Oral Daily  . mupirocin ointment   Nasal BID  . oxybutynin  5 mg Oral QHS  . sodium chloride  3 mL Intravenous Q12H      LOS: 2 days   Timathy Newberry M.D. [facility] 02/06/2013, 11:53 AM Pager: [phone]  If 7PM-7AM, please contact night-coverage [email or web address] Password [email or web address]

## 2013-02-07 DIAGNOSIS — K219 Gastro-esophageal reflux disease without esophagitis: Secondary | ICD-10-CM

## 2013-02-07 LAB — RENAL FUNCTION PANEL
BUN: 11 mg/dL (ref 6–23)
Calcium: 8.4 mg/dL (ref 8.4–10.5)
Creatinine, Ser: 0.69 mg/dL (ref 0.50–1.10)
Glucose, Bld: 96 mg/dL (ref 70–99)
Phosphorus: 1.7 mg/dL — ABNORMAL LOW (ref 2.3–4.6)
Potassium: 3.7 mEq/L (ref 3.5–5.1)

## 2013-02-07 MED ORDER — FLEET ENEMA 7-19 GM/118ML RE ENEM: 1.0000 | ENEMA | Freq: Every day | RECTAL | Status: DC | PRN

## 2013-02-07 MED ORDER — OXYCODONE HCL 5 MG PO TABS
15.0000 mg | ORAL_TABLET | ORAL | Status: DC | PRN
  Administered 2013-02-07 – 2013-02-08 (×3): 15 mg via ORAL
  Filled 2013-02-07 (×3): qty 3

## 2013-02-07 MED ORDER — BISACODYL 5 MG PO TBEC: 5.0000 mg | DELAYED_RELEASE_TABLET | Freq: Every day | ORAL | Status: DC | PRN

## 2013-02-07 MED ORDER — FAMOTIDINE 20 MG PO TABS
20.0000 mg | ORAL_TABLET | Freq: Two times a day (BID) | ORAL | Status: DC
  Administered 2013-02-07 – 2013-02-10 (×6): 20 mg via ORAL
  Filled 2013-02-07 (×7): qty 1

## 2013-02-07 NOTE — Progress Notes (Signed)
Physical Therapy Treatment Patient Details Name: Katelyn [name] MRN: [id number] DOB: 10-06-1960 Today's Date: 02/07/2013 Time: 4098-1191 PT Time Calculation (min): 27 min  PT Assessment / Plan / Recommendation  PT Comments   Slow moving again today. Only able to walk to and from the bathroom because of lethargy and pain in her back. Spoke with case manager who reports her insurance will not cover HHPT so she needs to be pretty independent prior to d/c home due to limited support from her mother (mother is disabled and cannot physical assist her). Currently she requires min-modA for bed mobility and close guarding for all ambulation especially when she is on the pain medication (which makes her very lethargic). Spoke to her about the option of SNF and she did not seem very interested in this. Hopeful once she starts eating and mobilizing that she will start to progress more.  Follow Up Recommendations  Home health PT;Supervision for mobility/OOB     Does the patient have the potential to tolerate intense rehabilitation     Barriers to Discharge        Equipment Recommendations  Rolling walker with 5" wheels    Recommendations for Other Services    Frequency Min 5X/week   Progress towards PT Goals Progress towards PT goals: Progressing toward goals (slowly)  Plan Current plan remains appropriate    Precautions / Restrictions Precautions Precautions: Back Precaution Booklet Issued: Yes (comment) Precaution Comments: BAT handout Required Braces or Orthoses: Spinal Brace Spinal Brace: Lumbar corset;Applied in sitting position   Pertinent Vitals/Pain Moderate back pain during all mobility today, repositioned back in bed after our session    Mobility  Bed Mobility Bed Mobility: Rolling Right;Right Sidelying to Sit;Sit to Sidelying Right;Rolling Left Rolling Right: 5: Supervision Right Sidelying to Sit: HOB flat;3: Mod assist Sit to Sidelying Right: 3: Mod assist Details for Bed  Mobility Assistance: verbal cues for log roll and safe mobility with back precautions, mod facilitation to elevate trunk,slightly more relaxed due to medication however still needing assist for follow through and technique; modA to elevate legs and sequence getting back to bed; verbal cues throughout to relax and breathe through the movement Transfers Transfers: Sit to Stand;Stand to Sit Sit to Stand: 4: Min assist;From bed Stand to Sit: 4: Min guard;To bed Details for Transfer Assistance: cues for hand placement and technique, facilitation for follow through, cues to calm down Ambulation/Gait Ambulation/Gait Assistance: 4: Min guard Ambulation Distance (Feet): 20 Feet Assistive device: Rolling walker Ambulation/Gait Assistance Details: cues for posture, more shuffled pattern today needing more gaurding because of lethargy (recent pain meds??), cues for technique with RW in tight spaces Gait Pattern: Decreased stride length;Trunk flexed;Shuffle Gait velocity: decreased Stairs: No      PT Goals (current goals can now be found in the care plan section)    Visit Information  Last PT Received On: 02/07/13 Assistance Needed: +1 History of Present Illness: 52 y/o female s/p PLIF L4-5, L5-S1.     Subjective Data      Cognition  Cognition Arousal/Alertness: Lethargic Behavior During Therapy: WFL for tasks assessed/performed Overall Cognitive Status: Within Functional Limits for tasks assessed    Balance     End of Session PT - End of Session Equipment Utilized During Treatment: Gait belt Activity Tolerance: Patient limited by fatigue;Patient limited by pain;Patient limited by lethargy Patient left: in bed;with call bell/phone within reach Nurse Communication: Mobility status   GP     [facility] HELEN 02/07/2013, 1:30 PM

## 2013-02-07 NOTE — Progress Notes (Signed)
Patient ID: Katelyn [name], female   DOB: 1960/12/27, 52 y.o.   MRN: [id number] Much better,c/o incisional pain. No more n/v. Wound dry. Bun and creatinine back to normal. Hb stable see orders

## 2013-02-07 NOTE — Progress Notes (Signed)
Occupational Therapy Treatment Patient Details Name: Katelyn [name] MRN: [id number] DOB: 05/03/1961 Today's Date: 02/07/2013 Time: 4098-1191 OT Time Calculation (min): 35 min  OT Assessment / Plan / Recommendation  OT comments  Pt progressing towards goals. Pt up and ambulating to bathroom for ADL today. Pt educated that they need to be proactive and ask/manage pain meds. Pt unable to stand at sink for ADL. Pt requested to sit after 1/4 of task completion. Pt also needed mod assist to don/doff brace. Pt provided "hip kit" for LB bathing and dressing. Pt may want to think about a d/c to SNF if progression does not incr with speed because Pt is not able to get HHOT because of medicare. Pt continues to benefit from skilled OT in the acute setting.   Follow Up Recommendations  Other (comment) (HHOT not an option Pt may need to talk SNF)    Barriers to Discharge   concern about her mother being able to take care of her upon d/c.    Equipment Recommendations  Other (comment) (TBD)       Frequency Min 2X/week   Progress towards OT Goals Progress towards OT goals: Progressing toward goals  Plan Discharge plan remains appropriate    Precautions / Restrictions Precautions Precautions: Back Precaution Booklet Issued: Yes (comment) Precaution Comments: BAT handout Required Braces or Orthoses: Spinal Brace Spinal Brace: Lumbar corset;Applied in sitting position Restrictions Weight Bearing Restrictions: No   Pertinent Vitals/Pain Pt reported 8/10 pain. Pt due for pain medication in 20 min.      ADL  Eating/Feeding: Modified independent Where Assessed - Eating/Feeding: Chair Grooming: Min guard Where Assessed - Grooming: Supported sitting Upper Body Bathing: Set up Where Assessed - Upper Body Bathing: Supported sitting Toilet Transfer: Minimal assistance Statistician Method: Sit to Barista: Comfort height toilet;Grab bars Toileting - Clothing Manipulation  and Hygiene: Minimal assistance Where Assessed - Engineer, mining and Hygiene: Sit to stand from 3-in-1 or toilet Equipment Used: Back brace;Gait belt;Rolling walker Transfers/Ambulation Related to ADLs: min guard sit <> stand with vc's for safety and hand placement ADL Comments: Pt initiated sink level ADL, and fatigued after about 1/4 of completion. Pt requested to sit on 3in1 for rest of grooming activites. Pt needed mod assist and mod vc's for donning brace.      OT Goals(current goals can now be found in the care plan section) Acute Rehab OT Goals Patient Stated Goal: stronger OT Goal Formulation: With patient Time For Goal Achievement: 02/19/13 Potential to Achieve Goals: Good ADL Goals Pt Will Perform Upper Body Dressing: with modified independence;sitting Pt Will Perform Lower Body Dressing: with supervision;sit to/from stand Pt Will Transfer to Toilet: with modified independence;ambulating Additional ADL Goal #1: Pt will recall and maintain 3/3 precautions during ADL  Visit Information  Last OT Received On: 02/07/13 Assistance Needed: +1 History of Present Illness: 52 y/o female s/p PLIF L4-5, L5-S1.           Cognition  Cognition Arousal/Alertness: Lethargic Behavior During Therapy: WFL for tasks assessed/performed Overall Cognitive Status: Within Functional Limits for tasks assessed    Mobility  Bed Mobility Bed Mobility: Rolling Right;Right Sidelying to Sit;Sit to Sidelying Right;Rolling Left Rolling Right: 5: Supervision Right Sidelying to Sit: HOB flat;3: Mod assist Sit to Sidelying Right: 3: Mod assist Details for Bed Mobility Assistance: verbal cues for log roll and safe mobility with back precautions, mod facilitation to elevate trunk,slightly more relaxed due to medication however still needing assist for follow  through and technique; modA to elevate legs and sequence getting back to bed; verbal cues throughout to relax and breathe through the  movement Transfers Transfers: Sit to Stand;Stand to Sit Sit to Stand: 4: Min assist;From bed Stand to Sit: 4: Min guard;To bed Details for Transfer Assistance: cues for hand placement and technique, facilitation for follow through          End of Session OT - End of Session Equipment Utilized During Treatment: Gait belt;Rolling walker;Back brace Activity Tolerance: Patient limited by pain Patient left: in bed;with call bell/phone within reach;with family/visitor present Nurse Communication: Mobility status;Precautions  GO     Sherryl Manges 02/07/2013, 2:10 PM

## 2013-02-07 NOTE — Progress Notes (Signed)
Patient ID: Katelyn [name]  female  ZOX:[id number]    DOB: [birth year]    DOA: 02/04/2013  PCP: Cassell Smiles., MD  Assessment/Plan: Active Problems:   Hypotension, unspecified   ARF (acute renal failure)   Hyponatremia   Abdominal pain  Hypotension - resolved, -  likely secondary to hypovolemia, narcotics with antihypertensives.  -  Hold antihypertensives for now. Does not need any transfusion for now. - Tolerating diet without any difficulty   Acute renal failure - resolved, probably secondary to volume loss and hypotension.  - Renal ultrasound did not show any hydronephrosis or obstruction   Abdominal pain with nausea and vomiting - resolved - CT abdomen and pelvis did not show any acute abdominal pathology, currently tolerating regular diet without any difficulty   Hyponatremia - probably from dehydration, recent surgery, resolved    Anemia - either due to blood loss or dilution. - transfuse if less than 7.5  Mildly elevated LFTs - patient does have a history of hepatitis C which patient states has been treated many years ago, could be elevated due to hypotension.  - follow LFTs for any further worsening, Hold statins for now.    status post L4-L5 and L5-S1 laminectomy/fusion: Per primary team, neurosurgery  DVT Prophylaxis:  Code Status:  Disposition: The patient appears to be at her baseline, all in acute medical issues appear to be resolved. I will sign off please call for any questions.    Subjective: Feels great today, family members at the bedside, tolerating regular diet without any difficulty  Objective: Weight change:  No intake or output data in the 24 hours ending 02/07/13 1329 Blood pressure 105/50, pulse 85, temperature 98.4 F (36.9 C), temperature source Oral, resp. rate 20, height 5\' 4"  (1.626 m), weight 79.379 kg (175 lb), SpO2 93.00%.  Physical Exam: General: Alert and awake, oriented x3, nad. CVS: S1-S2 clear, no murmur rubs or gallops Chest:  CTAB Abdomen: soft, NT, ND, NBS  Extremities: no c/c/e bilaterally   Lab Results: Basic Metabolic Panel:  Recent Labs Lab 02/06/13 0531 02/07/13 0535  NA 132* 135  K 3.8 3.7  CL 98 100  CO2 24 26  GLUCOSE 93 96  BUN 24* 11  CREATININE 1.29* 0.69  CALCIUM 7.7* 8.4  PHOS  --  1.7*   Liver Function Tests:  Recent Labs Lab 02/05/13 1756 02/06/13 0531 02/07/13 0535  AST 66* 74*  --   ALT 22 25  --   ALKPHOS 71 71  --   BILITOT 0.4 0.4  --   PROT 6.2 5.8*  --   ALBUMIN 3.1* 3.1* 2.9*   No results found for this basename: LIPASE, AMYLASE,  in the last 168 hours No results found for this basename: AMMONIA,  in the last 168 hours CBC:  Recent Labs Lab 02/05/13 2306 02/06/13 0531  WBC 9.5 9.6  NEUTROABS 7.1 6.9  HGB 9.4* 8.4*  HCT 27.9* 24.8*  MCV 92.7 92.5  PLT 202 208   Cardiac Enzymes:  Recent Labs Lab 02/05/13 2306  TROPONINI <0.30   BNP: No components found with this basename: POCBNP,  CBG: No results found for this basename: GLUCAP,  in the last 168 hours   Micro Results: Recent Results (from the past 240 hour(s))  SURGICAL PCR SCREEN     Status: None   Collection Time    02/04/13  8:53 AM      Result Value Range Status   MRSA, PCR NEGATIVE  NEGATIVE Final   Staphylococcus  aureus NEGATIVE  NEGATIVE Final   Comment:            The Xpert SA Assay (FDA     approved for NASAL specimens     in patients over 71 years of age),     is one component of     a comprehensive surveillance     program.  Test performance has     been validated by [employer] for patients greater     than or equal to 63 year old.     It is not intended     to diagnose infection nor to     guide or monitor treatment.    Studies/Results: Dg Chest 2 View  02/03/2013   *RADIOLOGY REPORT*  Clinical Data: Preop for lumbar surgery  CHEST - 2 VIEW  Comparison: 04/26/2011  Findings: Cardiomediastinal silhouette is stable.  No acute infiltrate or pleural effusion.  No  pulmonary edema.  Metallic fixation plate noted cervical spine.  Bony thorax is unremarkable. Old right rib fractures.  IMPRESSION: No active disease.   Original Report Authenticated By: Natasha Mead, M.D.   Dg Lumbar Spine 2-3 Views  02/04/2013   *RADIOLOGY REPORT*  Clinical Data: 52 year old female undergoing lumbar spine surgery.  LUMBAR SPINE - 2-3 VIEW, DG C-ARM 1-60 MIN  Comparison: 1141 hours the same day and earlier.  Findings: Intraoperative fluoroscopic views in the AP and lateral projection.  Placement of bilateral L4, L5, and S1 transpedicular screws.  L4-L5 and L5-S1 interbody implant in place.  Evidence of decompression.  Connecting rods not yet in place.  IMPRESSION: Posterior and interbody fusion under way at L4-L5 and L5-S1.   Original Report Authenticated By: Erskine Speed, M.D.   Dg Lumbar Spine 1 View  02/04/2013   *RADIOLOGY REPORT*  Clinical Data: 52 year old female undergoing lumbar spine surgery.  LUMBAR SPINE - 1 VIEW  Comparison: CT discogram 12/19/2012.  Lumbar MRI 10/11/2012.  Findings: The same numbering system as on 12/19/2012.  Portable cross-table lateral intraoperative view of the lumbar spine labeled film #1 at 1141 hours.  Cephalad surgical probe at the L4 pedicle level.  Caudal surgical probe at the L5 pedicle level.  IMPRESSION: Intraoperative localization as above.   Original Report Authenticated By: Erskine Speed, M.D.   US Renal  02/05/2013   *RADIOLOGY REPORT*  Clinical Data: Acute renal failure.  RENAL/URINARY TRACT ULTRASOUND COMPLETE  Comparison:  CT 04/30/2011  Findings:  Right Kidney:  Right kidney could not be visualized due to the patient's pain and positioning.  Left Kidney:  Normal appearance of the left kidney.  Left kidney measures 11.0 cm in length without hydronephrosis.  Bladder:  The patient has a Foley catheter and the bladder is decompressed.  IMPRESSION: Normal appearance of left kidney.  Right kidney could not be visualized.   Original Report  Authenticated By: Richarda Overlie, M.D.   Dg Abd Portable 1v  02/05/2013   *RADIOLOGY REPORT*  Clinical Data: P L I F 02/04/2013.  Nausea vomiting and abdominal distension.  PORTABLE ABDOMEN - 1 VIEW  Comparison: None.  Findings: Mildly distended transverse colon. Gas-filled loops of bowel the left upper quadrant are likely a combination of small and large bowel, with the largest representing the splenic flexure.  No abnormal intra-abdominal mass effect or calcification.  There are likely mild atelectatic changes at the bases, not unexpected postoperatively.  L4-S1 P L I F posterior rod and screw fixation.  IMPRESSION:   No  evidence of high-grade obstruction.  If symptoms persist, recommend follow-up to ensure normal progression of bowel gas.   Original Report Authenticated By: Tiburcio Pea   Dg C-arm 1-60 Min  02/04/2013   *RADIOLOGY REPORT*  Clinical Data: 52 year old female undergoing lumbar spine surgery.  LUMBAR SPINE - 2-3 VIEW, DG C-ARM 1-60 MIN  Comparison: 1141 hours the same day and earlier.  Findings: Intraoperative fluoroscopic views in the AP and lateral projection.  Placement of bilateral L4, L5, and S1 transpedicular screws.  L4-L5 and L5-S1 interbody implant in place.  Evidence of decompression.  Connecting rods not yet in place.  IMPRESSION: Posterior and interbody fusion under way at L4-L5 and L5-S1.   Original Report Authenticated By: Erskine Speed, M.D.    Medications: Scheduled Meds: . famotidine  20 mg Oral BID  . FLUoxetine  40 mg Oral Daily  . mupirocin ointment   Nasal BID  . oxybutynin  5 mg Oral QHS  . sodium chloride  3 mL Intravenous Q12H      LOS: 3 days   RAI,RIPUDEEP M.D. [facility] 02/07/2013, 1:29 PM Pager: [phone]  If 7PM-7AM, please contact night-coverage [email or web address] Password [email or web address]

## 2013-02-07 NOTE — Progress Notes (Signed)
I agree with the following treatment note after reviewing documentation.   Johnston, Simmie Camerer Brynn   OTR/L Pager: [phone] Office: [phone] .   

## 2013-02-07 NOTE — Care Management Note (Signed)
    Page 1 of 2   02/10/2013     1:40:36 PM   CARE MANAGEMENT NOTE 02/10/2013  Patient:  Katelyn [name], Katelyn [name]   Account Number:  [id number]  Date Initiated:  02/05/2013  Documentation initiated by:  Jiles Crocker  Subjective/Objective Assessment:   ADMITTED FOR SURGERY - decompression and fusion at l45 and l5s1     Action/Plan:   LIVES WITH FAMILY MEMBERS; CM FOLLOWING FOR DCP   Anticipated DC Date:  02/09/2013   Anticipated DC Plan:  HOME/SELF CARE      DC Planning Services  CM consult      Choice offered to / List presented to:  C-1 Patient   DME arranged  3-N-1  Levan Hurst      DME agency  [facility]        Status of service:  Completed, signed off Medicare Important Message given?  NA - LOS <3 / Initial given by admissions (If response is "NO", the following Medicare IM given date fields will be blank) Date Medicare IM given:   Date Additional Medicare IM given:    Discharge Disposition:  HOME/SELF CARE  Per UR Regulation:  Reviewed for med. necessity/level of care/duration of stay  If discussed at Long Length of Stay Meetings, dates discussed:    Comments:  02/10/13 1220 Elmer Bales RN, MSN, CM-  Spoke with patient and her RN to discuss home health needs.  Pt does not have a Medicaid qualifying diagnosis for home health therapies, but is interested in outpatient PT/OT at [facility].  Pt was up ambulating in the hallway at time of conversation, using walker and standby assist.  She states she has transportation.  Pt is uninterested in persuing a SNF, and reportedly showered independently this morning. Pt's RN feels that outpatient PT/OT would be appropriate. Appointment was made at [facility] outpatient Rehab for Wednesday, July 23 at 11am.  This information was shared with patient and family and was added to the AVS.   02/07/13 1130 Elmer Bales RN, MSN, CM- Spoke with PT/OT to see if patient would be appropriate for outpatient PT/OT as her  insuranced will not cover home health.  At this time, PT/OT does not feel that outpatient therapy is appropriate as they feel it would be too intense.  CM will continue to follow progression prior to discharge.   02/05/2013- B CHANDLER RN,BSN,MHA

## 2013-02-07 NOTE — Progress Notes (Signed)
Patient noted very lethargic, in and out of drowsiness, c/o being hot, redness noted around neck/chest, running low grade temp of 100.5. Tylenol 650 mg given per prn order. Dr. Phoebe Perch on call notified and ordered CBC and BMP to be drawn in the morning at 0500. Order carried out. Will continue to monitor patient.

## 2013-02-07 NOTE — Progress Notes (Signed)
I see that renal function is back to normal with good UOP- no new suggestions, renal will sign off.   Katelyn [name] A

## 2013-02-08 LAB — BASIC METABOLIC PANEL
BUN: 7 mg/dL (ref 6–23)
Chloride: 101 mEq/L (ref 96–112)
GFR calc non Af Amer: >90 mL/min (ref >90)
Glucose, Bld: 142 mg/dL — ABNORMAL HIGH (ref 70–99)
Potassium: 3.7 mEq/L (ref 3.5–5.1)

## 2013-02-08 LAB — PREPARE RBC (CROSSMATCH)

## 2013-02-08 MED ORDER — SODIUM CHLORIDE 0.9 % IJ SOLN: 3.0000 mL | INTRAMUSCULAR | Status: DC | PRN

## 2013-02-08 MED ORDER — SODIUM CHLORIDE 0.9 % IJ SOLN
3.0000 mL | Freq: Two times a day (BID) | INTRAMUSCULAR | Status: DC
  Administered 2013-02-08 – 2013-02-09 (×4): 3 mL via INTRAVENOUS

## 2013-02-08 MED ORDER — OXYCODONE-ACETAMINOPHEN 5-325 MG PO TABS
1.0000 | ORAL_TABLET | ORAL | Status: DC | PRN
  Administered 2013-02-08 – 2013-02-10 (×11): 2 via ORAL
  Filled 2013-02-08 (×9): qty 2
  Filled 2013-02-08: qty 1
  Filled 2013-02-08 (×2): qty 2

## 2013-02-08 NOTE — Progress Notes (Signed)
Physical Therapy Treatment Patient Details Name: Vi Biddinger MRN: [id number] DOB: 11-28-1960 Today's Date: 02/08/2013 Time: 4098-1191 PT Time Calculation (min): 26 min  PT Assessment / Plan / Recommendation  PT Comments   Better arousal today and able to ambulate further. Still c/o significant back pain however per RN and charge RN patient now on a better scheduled dose of pain meds. Very anxious during our session initially and complaining of night staff nurse. Spoke with charge nurse about this following our session. Noted a congested cough today without production. SpO2 86% on RA after ambulation. Instructed pt in use of incentive spirometer and she performed x10. RN made aware and 2 liters [place] reapplied.   Follow Up Recommendations  Home health PT;Supervision for mobility/OOB     Does the patient have the potential to tolerate intense rehabilitation     Barriers to Discharge        Equipment Recommendations  Rolling walker with 5" wheels    Recommendations for Other Services    Frequency Min 5X/week   Progress towards PT Goals Progress towards PT goals: Progressing toward goals  Plan Current plan remains appropriate    Precautions / Restrictions Precautions Precautions: Back Precaution Booklet Issued: Yes (comment) Precaution Comments: BAT handout Required Braces or Orthoses: Spinal Brace Spinal Brace: Lumbar corset;Applied in sitting position Restrictions Weight Bearing Restrictions: No   Pertinent Vitals/Pain C/o significant back pain during our session, RN aware and meds provided prior to our session    Mobility  Bed Mobility Bed Mobility: Rolling Right;Right Sidelying to Sit Rolling Right: 4: Min guard;With rail Right Sidelying to Sit: 4: Min guard;HOB elevated (30 degrees) Details for Bed Mobility Assistance: sequencing cues, easier today however HOB elevated and patient using the rail, still c/o a lot of back pain during all movement Transfers Transfers: Sit  to Stand;Stand to Sit Sit to Stand: 4: Min assist Stand to Sit: 4: Min assist Details for Transfer Assistance: mod verbal cues for safe hand placement and sequencing as well as facilitation for stability, pt still pulling on RW regardless of instruction otherwise reporting she feels like she will fall if she doesn't pull on RW (RW stabilized by the therapist); impulsive this morning because of urgency to urinate Ambulation/Gait Ambulation/Gait Assistance: 4: Min guard Ambulation Distance (Feet): 250 Feet Assistive device: Rolling walker Ambulation/Gait Assistance Details: cues for tall posture and safe positioning with RW Gait Pattern: Step-through pattern;Trunk flexed;Decreased stride length Gait velocity: decreased Stairs: No    Exercises Other Exercises Other Exercises: incentive spirometer x 10 with verbal cues for technique; instructed to perform 10x/hr   PT Goals (current goals can now be found in the care plan section)    Visit Information  Last PT Received On: 02/08/13 Assistance Needed: +1 History of Present Illness: 52 y/o female s/p PLIF L4-5, L5-S1.     Subjective Data      Cognition  Cognition Arousal/Alertness: Lethargic Behavior During Therapy: WFL for tasks assessed/performed Overall Cognitive Status: Within Functional Limits for tasks assessed    Balance     End of Session PT - End of Session Equipment Utilized During Treatment: Gait belt Activity Tolerance: Patient tolerated treatment well;Patient limited by fatigue Patient left: in chair;with call bell/phone within reach Nurse Communication: Mobility status;Other (comment) (O2 sats on RA 86%)   GP     WHITLOW,Azaya Goedde HELEN 02/08/2013, 10:54 AM

## 2013-02-08 NOTE — Progress Notes (Signed)
Patient states that she feels weak and tired today. Pain poorly controlled.  Currently afebrile. Heart rate 90. Blood pressure 112/45. Urine output not recorded. Patient is awake and aware. She appears uncomfortable. She answers questions appropriately and motor and sensory function are intact. Her abdomen is soft and nontender. Dressing is dry.  Patient's hemoglobin down to 6.9 today.  Symptomatic postoperative blood loss anemia. I discussed situation with the patient. I recommended that she undergo transfusion today to treat her anemia. We will also adjust her pain medications something less strong that she can have more often.

## 2013-02-08 NOTE — Progress Notes (Signed)
Paged floor coverage concerning critical lab value hbg 6.9. Still awaiting return call.

## 2013-02-08 NOTE — Progress Notes (Signed)
Occupational Therapy Treatment Patient Details Name: Katelyn [name] MRN: [id number] DOB: [birth year] Today's Date: 02/08/2013 Time: 4098-1191 OT Time Calculation (min): 23 min  OT Assessment / Plan / Recommendation  OT comments  Pt not making progress today due to pain. SNF should be considered.  Follow Up Recommendations  SNF       Equipment Recommendations  3 in 1 bedside comode (tub DME TBD)       Frequency Min 2X/week   Progress towards OT Goals Progress towards OT goals: Not progressing toward goals - comment (due to pain)  Plan Discharge plan needs to be updated    Precautions / Restrictions Precautions Precautions: Back Required Braces or Orthoses: Spinal Brace Spinal Brace: Lumbar corset;Applied in sitting position Restrictions Weight Bearing Restrictions: No   Pertinent Vitals/Pain 8/10 back; assisted pt to lay back down and called nursing    ADL  Lower Body Dressing: Performed;Moderate assistance Where Assessed - Lower Body Dressing: Unsupported sitting Equipment Used: Reacher;Sock aid;Rolling walker;Back brace ADL Comments: PT asked me to see the patient since she was up already in the recliner. Upon entering pt's room she was back in bed. I explained who I was and that I could come back since she was already in the bed; however the patient said she could work with me know. Pt up to the EOB and about 10 minutes into it she said she was hurting really bad and had not gotten any sleep last night due to increased pain. Assisted pt with laying back down and pushed call bell to ask for pain meds Pt with increased A needed for use of AE due to pain issues.       OT Goals(current goals can now be found in the care plan section)    Visit Information  Last OT Received On: 02/08/13 Assistance Needed: +1 History of Present Illness: 52 y/o female s/p PLIF L4-5, L5-S1.           Cognition  Cognition Arousal/Alertness: Awake/alert Behavior During Therapy: Anxious (due  to pain) Overall Cognitive Status: Within Functional Limits for tasks assessed    Mobility  Bed Mobility Bed Mobility: Rolling Left;Left Sidelying to Sit;Sit to Sidelying Left Rolling Left: 5: Supervision;With rail Left Sidelying to Sit: 4: Min assist;HOB flat;With rails Sit to Sidelying Left: 3: Mod assist;With rail;HOB flat Transfers Transfers: Sit to Stand;Stand to Sit Sit to Stand: 4: Min assist;With upper extremity assist;From bed Stand to Sit: 4: Min assist;With upper extremity assist;To bed Details for Transfer Assistance: VCs for safe hand placement          End of Session OT - End of Session Equipment Utilized During Treatment: Rolling walker;Back brace Activity Tolerance: Patient limited by pain Patient left: in bed;with call bell/phone within reach;with family/visitor present Nurse Communication:  (pt asking for pain meds)       Katelyn [name] [phone] 02/08/2013, 3:50 PM

## 2013-02-09 LAB — CBC WITH DIFFERENTIAL/PLATELET
Basophils Absolute: 0 10*3/uL (ref 0.0–0.1)
HCT: 20.3 % — ABNORMAL LOW (ref 36.0–46.0)
Lymphs Abs: 1 10*3/uL (ref 0.7–4.0)
MCH: 31.7 pg (ref 26.0–34.0)
MCV: 93.1 fL (ref 78.0–100.0)
Monocytes Absolute: 0.8 10*3/uL (ref 0.1–1.0)
Monocytes Relative: 8 % (ref 3–12)
Neutro Abs: 8.1 10*3/uL — ABNORMAL HIGH (ref 1.7–7.7)
RDW: 13 % (ref 11.5–15.5)
WBC: 9.9 10*3/uL (ref 4.0–10.5)

## 2013-02-09 LAB — TYPE AND SCREEN: Unit division: 0

## 2013-02-09 NOTE — Progress Notes (Signed)
Physical Therapy Treatment Patient Details Name: Katelyn [name] MRN: [id number] DOB: March 05, 1961 Today's Date: 02/09/2013 Time: 1478-2956 PT Time Calculation (min): 30 min  PT Assessment / Plan / Recommendation  PT Comments   Pt reports less pain now receiving meds on schedule, and improved energy s/p transfusion.  Motivated, asking nursing assist for hallway ambulation.  Remains limited by 6/10 pain but approaching physical independence with functional mobility tasks and likely ready for d/c home once medically ready.    Follow Up Recommendations  Home health PT;Supervision for mobility/OOB     Does the patient have the potential to tolerate intense rehabilitation     Barriers to Discharge        Equipment Recommendations  Rolling walker with 5" wheels    Recommendations for Other Services    Frequency Min 5X/week   Progress towards PT Goals Progress towards PT goals: Progressing toward goals  Plan Current plan remains appropriate    Precautions / Restrictions Precautions Precautions: Back Required Braces or Orthoses: Spinal Brace Spinal Brace: Lumbar corset;Applied in sitting position   Pertinent Vitals/Pain Meds 120 min prior to session, pain 6/10 when walking; positioned on side and educated on pillow b/w legs for optimal comfort and back alignment.    Mobility  Bed Mobility Bed Mobility: Right Sidelying to Sit;Sit to Sidelying Right Right Sidelying to Sit: 5: Supervision;HOB flat Sit to Sidelying Right: 4: Min guard;HOB flat Details for Bed Mobility Assistance: pt most concerned about bed mobility at home so practice with home simulation bed height and rails dropped: instrucitonal cues for sit>side technique and over 2 repeated trials pt able to demostrate physical independence with standby assist for safety to block pt at EOB with slick sheets and risk for slipping off bed; pt expressed confidence with technique and reported decreased concern for home  performance Transfers Transfers: Sit to Stand;Stand to Sit Sit to Stand: 5: Supervision;With upper extremity assist;From bed Stand to Sit: 4: Min guard;With upper extremity assist;To bed;To elevated surface Details for Transfer Assistance: standby for safety with occasional verbal cues for optimal technique; pt demonstrates good problem solving and decision making when transitioning to/from RW; obvious effort especially into standing Ambulation/Gait Ambulation/Gait Assistance: 5: Supervision Ambulation Distance (Feet): 200 Feet Assistive device: Rolling walker Ambulation/Gait Assistance Details: instructional cues for incr heel strike, and to remain in close proximity of RW in order to increase use of unassisted posture and mechanics; difficulty maintaining both refinements simultaneously Gait Pattern: Step-through pattern;Decreased stride length;Right foot flat;Left foot flat;Antalgic Stairs: No (pt reports ramp now built at home entry)    Exercises     PT Diagnosis:    PT Problem List:   PT Treatment Interventions:     PT Goals (current goals can now be found in the care plan section) Acute Rehab PT Goals PT Goal Formulation: With patient  Visit Information  Last PT Received On: 02/09/13 Assistance Needed: +1 History of Present Illness: 53 y/o female s/p PLIF L4-5, L5-S1.     Subjective Data  Subjective: Feeling better after blood transfusion and less pain with schedule pain meds   Cognition  Cognition Arousal/Alertness: Awake/alert Behavior During Therapy: WFL for tasks assessed/performed Overall Cognitive Status: Within Functional Limits for tasks assessed    Balance     End of Session PT - End of Session Equipment Utilized During Treatment: Gait belt Activity Tolerance: Patient limited by pain;Patient limited by lethargy (becoming lethargic at end, reports up walking earlier a.m.) Patient left: in bed;with call bell/phone within reach Nurse  Communication: Mobility  status;Other (comment)   GP     Narda Amber [facility] 02/09/2013, 9:35 AM

## 2013-02-09 NOTE — Progress Notes (Signed)
Improving after blood transfusion   Temp:  [97.4 F (36.3 C)-99.5 F (37.5 C)] 97.8 F (36.6 C) (07/20 0607) Pulse Rate:  [76-94] 80 (07/20 0607) Resp:  [16-20] 16 (07/20 0607) BP: (123-162)/(59-77) 149/74 mmHg (07/20 0607) SpO2:  [86 %-96 %] 96 % (07/20 0607) Good strength and sensation Incision CDI  Plan: Increase activity  - check labs in am

## 2013-02-09 NOTE — Progress Notes (Addendum)
Occupational Therapy Treatment Patient Details Name: Katelyn [name] MRN: [id number] DOB: July 24, 1961 Today's Date: 02/09/2013 Time: 4132-4401 OT Time Calculation (min): 27 min  OT Assessment / Plan / Recommendation  OT comments  Practiced with AE for LB dressing. OT educated on toilet aid to assist with hygiene. Pt also practiced shower transfer. Recommending SNF vs. HHOT depending on progress. Pt lives with mother and pt states she is not able to physically assist.   Follow Up Recommendations  SNF;Home health OT    Barriers to Discharge       Equipment Recommendations  3 in 1 bedside comode    Recommendations for Other Services    Frequency Min 2X/week   Progress towards OT Goals Progress towards OT goals: Progressing toward goals  Plan Discharge plan needs to be updated    Precautions / Restrictions Precautions Precautions: Back Precaution Comments: Pt able to state 1/3 precautions. OT educated on precautions. Required Braces or Orthoses: Spinal Brace Spinal Brace: Lumbar corset;Applied in sitting position Restrictions Weight Bearing Restrictions: No   Pertinent Vitals/Pain Pain 8/10 in back. Repositioned. Increased activity. Premedicated.     ADL  Lower Body Dressing: Performed;Min guard Where Assessed - Lower Body Dressing: Supported sit to stand Toilet Transfer: Simulated;Min Pension scheme manager Method: Sit to Barista: Raised toilet seat with arms (or 3-in-1 over toilet) Tub/Shower Transfer: Simulated;Minimal assistance Tub/Shower Transfer Method: Science writer: Walk in shower;Other (comment) (3 in 1) Equipment Used: Back brace;Gait belt;Rolling walker;Sock aid;Reacher Transfers/Ambulation Related to ADLs: Minguard; Min A for shower transfer ADL Comments: Pt practiced shower transfer and at Min A level to maneuver walker and technique. Pt states she has shower chair with arms. Pt requiring several cues for hand  placement during transfers. Pt also practiced with reacher and sockaid donning/doffing socks and donning shorts and at Minguard level. Pt donned brace with cues to maintain precautions-supervision/setup level. Pt requiring some assistance with bed mobility. OT educated on setting up grooming items on right side of sink and also using two cups for teeth care to avoid breaking precautions.  Min cues to maintain precautions during session. OT educated on use of toilet aid for hygiene and provided pt with one.    OT Diagnosis:    OT Problem List:   OT Treatment Interventions:     OT Goals(current goals can now be found in the care plan section) Acute Rehab OT Goals Patient Stated Goal: to get better OT Goal Formulation: With patient Time For Goal Achievement: 02/19/13 Potential to Achieve Goals: Good ADL Goals Pt Will Perform Upper Body Dressing: with modified independence;sitting Pt Will Perform Lower Body Dressing: with supervision;sit to/from stand Pt Will Transfer to Toilet: with modified independence;ambulating Additional ADL Goal #1: Pt will recall and maintain 3/3 precautions during ADL  Visit Information  Last OT Received On: 02/09/13 Assistance Needed: +1 History of Present Illness: 52 y/o female s/p PLIF L4-5, L5-S1.     Subjective Data      Prior Functioning       Cognition  Cognition Arousal/Alertness: Awake/alert Behavior During Therapy: WFL for tasks assessed/performed Overall Cognitive Status: Within Functional Limits for tasks assessed    Mobility  Bed Mobility Bed Mobility: Rolling Right;Right Sidelying to Sit;Sitting - Scoot to Edge of Bed;Sit to Sidelying Right Rolling Right: 5: Supervision;With rail Right Sidelying to Sit: 4: Min assist Sitting - Scoot to Edge of Bed: 4: Min guard Sit to Sidelying Right: 4: Min assist;HOB flat Details for Bed Mobility Assistance: Pt  requiring Min A to lift trunk from sidelying position to sitting. Also Min A as pt returned  to sidelying position. Transfers Transfers: Sit to Stand;Stand to Sit Sit to Stand: 4: Min guard;With upper extremity assist;From bed;From chair/3-in-1 Stand to Sit: 4: Min guard;With upper extremity assist;To bed;To chair/3-in-1 Details for Transfer Assistance: several cues for hand placement.     Exercises      Balance     End of Session OT - End of Session Equipment Utilized During Treatment: Gait belt;Rolling walker;Back brace Activity Tolerance: Patient limited by pain Patient left: in bed;with call bell/phone within reach Nurse Communication: Mobility status  GO     Earlie Raveling OTR/L [phone] 02/09/2013, 1:15 PM

## 2013-02-10 LAB — CBC
Platelets: 250 10*3/uL (ref 150–400)
RBC: 2.89 MIL/uL — ABNORMAL LOW (ref 3.87–5.11)
WBC: 6.8 10*3/uL (ref 4.0–10.5)

## 2013-02-10 LAB — BASIC METABOLIC PANEL
CO2: 30 mEq/L (ref 19–32)
Calcium: 8.6 mg/dL (ref 8.4–10.5)
Chloride: 96 mEq/L (ref 96–112)
Sodium: 135 mEq/L (ref 135–145)

## 2013-02-10 NOTE — Progress Notes (Signed)
Occupational Therapy Treatment Patient Details Name: Katelyn [name] MRN: [id number] DOB: Jan 26, 1961 Today's Date: 02/10/2013 Time: 1191-4782 OT Time Calculation (min): 23 min  OT Assessment / Plan / Recommendation  OT comments  Pt with slurred speech and lethargic due to arousal level at times during session. Pt could not recall precautions, and needed max cues to remember brace before functional transfers. Pt able to perform toileting and peri care at min guard, but needed cues during sink level ADL. Discussed needing additional therapy with Pt before going home, and Pt agreed. Changed d/c to SNF to maximize independence in ADL, managing medicines, and functional transfers.   Follow Up Recommendations  SNF    Barriers to Discharge   Her mother who is supposed to take care of her, has a rollator walker and is not independently mobile.   Equipment Recommendations  3 in 1 bedside comode       Frequency Min 2X/week   Progress towards OT Goals Progress towards OT goals: Progressing toward goals  Plan Discharge plan needs to be updated    Precautions / Restrictions Precautions Precautions: Back Precaution Booklet Issued: Yes (comment) Precaution Comments: Pt with mumbled speech and eyes half open. Pt able to state 1/3 precautions, OTS reviewed handout with Pt, and at the end of the session, Pt was able to state 3/3 but with cues. Required Braces or Orthoses: Spinal Brace Spinal Brace: Lumbar corset;Applied in sitting position Restrictions Weight Bearing Restrictions: No   Pertinent Vitals/Pain  Pt reported 7/10 pain. Repositioned in chair, and discussed getting on a routine for medicine with Pt.   ADL  Grooming: Wash/dry hands;Min guard Where Assessed - Grooming: Supported standing Upper Body Dressing: Minimal assistance Where Assessed - Upper Body Dressing: Supported standing Toilet Transfer: Min Pension scheme manager Method: Sit to Barista: Comfort  height toilet;Grab bars Toileting - Clothing Manipulation and Hygiene: Minimal assistance Where Assessed - Glass blower/designer Manipulation and Hygiene: Standing Transfers/Ambulation Related to ADLs: Minguard ADL Comments: Pt performed bed mobility min guard, but needed vc's to remember brace after sitting up. (Pt thought she could go to the bathroom without it) Pt re-educated that brace is necessary. Pt seemed confused and her eyes kept going from half closed to open, and her speech was slurred. Pt performed toilet transfer using handrails at min guard, with vc's to prevent twisting. Pt able to perform BUE grooming tasks at sink with mod cues to prevent twisting and bending. Pt left in chair with verbal review of LB dressing with AE, and BAT handout to review again.     OT Goals(current goals can now be found in the care plan section) Acute Rehab OT Goals Patient Stated Goal: to get better OT Goal Formulation: With patient Time For Goal Achievement: 02/19/13 Potential to Achieve Goals: Good ADL Goals Pt Will Perform Upper Body Dressing: with modified independence;sitting Pt Will Perform Lower Body Dressing: with supervision;sit to/from stand Pt Will Transfer to Toilet: with modified independence;ambulating Additional ADL Goal #1: Pt will recall and maintain 3/3 precautions during ADL  Visit Information  Last OT Received On: 02/10/13 Assistance Needed: +1 History of Present Illness: 52 y/o female s/p PLIF L4-5, L5-S1.           Cognition  Cognition Arousal/Alertness: Suspect due to medications Behavior During Therapy: WFL for tasks assessed/performed Overall Cognitive Status: Within Functional Limits for tasks assessed    Mobility  Bed Mobility Bed Mobility: Rolling Left;Left Sidelying to Sit;Sitting - Scoot to [employer]  Left: 5: Supervision;With rail Left Sidelying to Sit: 5: Supervision;HOB flat;With rails Sitting - Scoot to Edge of Bed: 5: Supervision Details for Bed  Mobility Assistance: OTS had Pt tell each step of bed mobility before performing it as a way to review. Pt knew all steps to follow precautions, and did not need assistance to lift trunk today. Transfers Transfers: Sit to Stand;Stand to Sit Sit to Stand: 4: Min guard;With upper extremity assist;From bed;From chair/3-in-1 Stand to Sit: 4: Min guard;With upper extremity assist;To bed;To chair/3-in-1 Details for Transfer Assistance: mod vc's to prevent bending and twisting          End of Session OT - End of Session Equipment Utilized During Treatment: Gait belt;Rolling walker;Back brace Activity Tolerance: Patient tolerated treatment well Patient left: in chair;with call bell/phone within reach Nurse Communication: Mobility status  GO     Sherryl Manges 02/10/2013, 9:52 AM

## 2013-02-10 NOTE — Progress Notes (Signed)
I agree with the following treatment note after reviewing documentation.   Johnston, Francoise Chojnowski Brynn   OTR/L Pager: [phone] Office: [phone] .   

## 2013-02-10 NOTE — Progress Notes (Signed)
Patient took a shower this morning without calling staff. Expalined by Clinical research associate earlier that an order is needed and that someone has to be close by. Patient did not follow instructions. No injury at this time but reminded to call before taking a shower.

## 2013-02-10 NOTE — Progress Notes (Signed)
Patient discharge paperwork and education complete.  IV removed and prescriptions given.  Patient's sister to drive her home.  Lance Bosch

## 2013-02-10 NOTE — Progress Notes (Signed)
Physical Therapy Treatment Patient Details Name: Katelyn [name] MRN: [id number] DOB: [birth year] Today's Date: 02/10/2013 Time: 4098-1191 PT Time Calculation (min): 23 min  PT Assessment / Plan / Recommendation  PT Comments   Entered patients room as she was preparing to discharge. Session focused on patient and family education and transfer training so as to maximize safety prior to d/c home. Patient still with significant difficult performing transfers independently. Sister instructed in how to stabilize RW while she pulled on it as even after several attempts she cannot stand without modA and significant pain. Per CM patient will not get HHPT. Patient declines SNF and wants to go home. She is going to attempt to get to OPPT although I suspect this will be very difficult for her to manage.   Follow Up Recommendations  Home health PT;Supervision for mobility/OOB     Does the patient have the potential to tolerate intense rehabilitation     Barriers to Discharge        Equipment Recommendations  Rolling walker with 5" wheels    Recommendations for Other Services    Frequency Min 5X/week   Progress towards PT Goals Progress towards PT goals: Progressing toward goals  Plan Current plan remains appropriate    Precautions / Restrictions Precautions Precautions: Back Precaution Booklet Issued: Yes (comment) Precaution Comments: Pt with mumbled speech and eyes half open. Pt able to state 1/3 precautions, OTS reviewed handout with Pt, and at the end of the session, Pt was able to state 3/3 but with cues. Required Braces or Orthoses: Spinal Brace Spinal Brace: Lumbar corset;Applied in sitting position Restrictions Weight Bearing Restrictions: No   Pertinent Vitals/Pain Increased pain during session, RN aware    Mobility  Bed Mobility Bed Mobility: Rolling Left;Left Sidelying to Sit;Sitting - Scoot to Edge of Bed Rolling Left: 5: Supervision;With rail Right Sidelying to Sit: 4: Min  guard;HOB flat Left Sidelying to Sit: 5: Supervision;HOB flat;With rails Sitting - Scoot to Edge of Bed: 5: Supervision Sit to Sidelying Right: 4: Min guard;HOB flat Details for Bed Mobility Assistance: step by step sequencing, no physical assist needed today Transfers Transfers: Sit to Stand;Stand to Sit Sit to Stand: 4: Min assist;With upper extremity assist Stand to Sit: 4: Min assist;With upper extremity assist Details for Transfer Assistance: attempted to get patient to push from the bed as opposed to pulling on RW however after several attempts pt still needing faciliattion for follow through and stability so instructed pt and sister in stabilizing RW (sister) so that patient can pull/push on RW to stand, given that patient is leaving in approx 10 minutes this is the safest option right now however I repeated education several times on sister stabilizing RW  Ambulation/Gait Ambulation/Gait Assistance: 5: Supervision Ambulation Distance (Feet): 6 Feet Assistive device: Rolling walker Ambulation/Gait Assistance Details: cues for tall posture and extension through lower extremities when transferring to and from bed Gait Pattern: Step-through pattern;Right flexed knee in stance;Left flexed knee in stance;Trunk flexed;Shuffle Gait velocity: decreased      PT Goals (current goals can now be found in the care plan section) Acute Rehab PT Goals Patient Stated Goal: to get better  Visit Information  Last PT Received On: 02/10/13 Assistance Needed: +1 History of Present Illness: 52 y/o female s/p PLIF L4-5, L5-S1.     Subjective Data  Patient Stated Goal: to get better   Cognition  Cognition Arousal/Alertness: Suspect due to medications Behavior During Therapy: WFL for tasks assessed/performed Overall Cognitive Status: Within Functional Limits  for tasks assessed    Balance     End of Session PT - End of Session Equipment Utilized During Treatment: Gait belt Activity Tolerance:  Patient limited by pain;Patient limited by fatigue Patient left: in chair;with call bell/phone within reach Nurse Communication: Mobility status   GP     [facility] HELEN 02/10/2013, 12:57 PM

## 2013-02-10 NOTE — Discharge Summary (Signed)
Physician Discharge Summary  Patient ID: Katelyn [name] MRN: [id number] DOB/AGE: [birth year] 52 y.o.  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 Nausea/vomiting  Consults: renal, hospitalist  Significant Diagnostic Studies: mri/  Treatments:lumbar fusion  Discharge Exam: Blood pressure 103/92, pulse 83, temperature 97.9 F (36.6 C), temperature source Oral, resp. rate 18, height 5\' 4"  (1.626 m), weight 79.379 kg (175 lb), SpO2 90.00%. Ambulating. Lab back to normal  Disposition: home     Medication List    ASK your doctor about these medications       FLUoxetine 40 MG capsule  Commonly known as:  PROZAC  Take 40 mg by mouth daily.     furosemide 20 MG tablet  Commonly known as:  LASIX  Take 20 mg by mouth daily.     HYDROcodone-acetaminophen 7.5-325 MG per tablet  Commonly known as:  NORCO  Take 1 tablet by mouth every 6 (six) hours.     meloxicam 15 MG tablet  Commonly known as:  MOBIC  Take 15 mg by mouth daily.     omeprazole 20 MG capsule  Commonly known as:  PRILOSEC  Take 20 mg by mouth daily.     potassium chloride SA 20 MEQ tablet  Commonly known as:  K-DUR,KLOR-CON  Take 20 mEq by mouth daily.     simvastatin 20 MG tablet  Commonly known as:  ZOCOR  Take 20 mg by mouth every evening.     tolterodine 1 MG tablet  Commonly known as:  DETROL  Take 1 mg by mouth daily.     valsartan-hydrochlorothiazide 160-12.5 MG per tablet  Commonly known as:  DIOVAN-HCT  Take 1 tablet by mouth daily.         Signed: Karn Cassis 02/10/2013, 9:44 AM

## 2013-02-12 ENCOUNTER — Ambulatory Visit (HOSPITAL_COMMUNITY)
Admit: 2013-02-12 | Discharge: 2013-02-12 | Disposition: A | Discharge status: Home / Self Care | Payer: Medicaid Other | Attending: Neurosurgery | Admitting: Neurosurgery

## 2013-02-12 NOTE — Progress Notes (Signed)
  Patient Details  Name: Katelyn [name] MRN: [id number] Date of Birth: Aug 04, 1960  Today's Date: 02/12/2013  Re-eval:   Spoke with Kriste Basque, RN Dr. Jeral Fruit nurse who will leave a message for Dr. Jeral Fruit if pt is appropriate for pt s/p lumbar L4-S1 demcompression with fusion x1 week.   Answered questions about precautions and brace wear.   [name], MPT, ATC 02/12/2013, 11:56 AM

## 2013-05-06 ENCOUNTER — Ambulatory Visit (HOSPITAL_COMMUNITY)
Admission: RE | Admit: 2013-05-06 | Discharge: 2013-05-06 | Disposition: A | Discharge status: Home / Self Care | Payer: Medicaid Other | Source: Ambulatory Visit | Attending: Neurosurgery | Admitting: Neurosurgery

## 2013-05-06 DIAGNOSIS — M5137 Other intervertebral disc degeneration, lumbosacral region: Secondary | ICD-10-CM | POA: Insufficient documentation

## 2013-05-06 DIAGNOSIS — Z981 Arthrodesis status: Secondary | ICD-10-CM | POA: Insufficient documentation

## 2013-05-06 DIAGNOSIS — IMO0001 Reserved for inherently not codable concepts without codable children: Secondary | ICD-10-CM | POA: Insufficient documentation

## 2013-05-06 DIAGNOSIS — M51379 Other intervertebral disc degeneration, lumbosacral region without mention of lumbar back pain or lower extremity pain: Secondary | ICD-10-CM | POA: Insufficient documentation

## 2013-05-06 NOTE — Evaluation (Signed)
Physical Therapy Evaluation/Medicaid  Patient Details  Name: Katelyn [name] MRN: [id number] Date of Birth: June 18, 1961  Today's Date: 05/06/2013 Time: 1020-1100 PT Time Calculation (min): 40 min Charges: 1 evaluation Visit#: 1 of 4   Re-eval: 06/06/13  Assessment Diagnosis: Lumbar  Surgical Date: 02/04/13 Next MD Visit: Katelyn [name] - 3 months  Authorization: Medicaid  Authorization Time Period:  Authorization Visit#: 1 of 4  Past Medical History:  Past Medical History  Diagnosis Date  . Depression   . Panic attack   . Hypertension   . Hypercholesteremia   . Goiter   . PONV (postoperative nausea and vomiting)   . GERD (gastroesophageal reflux disease)   . Degenerative disc disease, lumbar   . Hepatitis   . Hepatitis C infection 2004    cured with treatment of interferon and ribavirin   Past Surgical History:  Past Surgical History  Procedure Laterality Date  . Total shoulder arthroplasty    . Foot surgery    . Neck surgery    . Tonsillectomy    . Colonoscopy N/A 10/14/2012    Procedure: COLONOSCOPY;  Surgeon: Katelyn Ade, MD;  Location: [facility];  Service: Endoscopy;  Laterality: N/A;  9:30 AM    Subjective Symptoms/Limitations Symptoms: Get up and getting down, getting out of bed, walking long distances, difficulty going up and down steps, pain to her feet. She plans to attend the [facility] for water aerobics, getting up every 90 minutes to walk.  Went to see Dr. Jeral [name] a few weeks ago and he stated to resume PT.  Limitations: Walking;House hold activities How long can you sit comfortably?: 60  How long can you stand comfortably?: 10-15 minutes How long can you walk comfortably?: 10 minutes and then need her RW.  Pain Assessment Currently in Pain?: Yes Pain Score: 3  (range: 3-8/10, worse at night) Pain Location: Back Pain Type: Acute pain Pain Onset: More than a month ago Pain Frequency: Constant Pain Relieving Factors: back brace (wearing 3 hours out of the  day)  Precautions/Restrictions  Precautions Precautions: Back Required Braces or Orthoses: Spinal Brace Spinal Brace: Lumbar corset Restrictions Weight Bearing Restrictions: No  Balance Screening  Has patient fallen in last 6 months: No Has patient decreased activity level: yes Is patient reluctant to leave home: No  Prior Functioning  Prior Function Vocation: Unemployed Comments: staying active, going to the [facility]  Coordination: Impaired to transverse abdominus (TrA) and multifidus musculature and requires moderate PT faciliation Assessment RLE Strength Right Hip Flexion: 3+/5 Right Hip Extension: 3+/5 Right Hip ABduction: 4/5 Right Hip ADduction: 4/5 LLE Strength Left Hip Flexion: 4/5 Left Hip Extension: 4/5 Left Hip ABduction: 5/5 Left Hip ADduction: 5/5  Exercise/Treatments   Supine Ab Set: 5 reps;5 seconds Clam: 5 reps;Limitations Clam Limitations: alternating with ab set Bent Knee Raise: 5 reps;Limitations Bent Knee Raise Limitations: w/ab set. alternating BLE Straight Leg Raise: 5 reps;Limitations Straight Leg Raises Limitations: BLE  Other Supine Lumbar Exercises: shoulder flexion w/ab set   Physical Therapy Assessment and Plan    PT Assessment and Plan Clinical Impression Statement: Pt is a 52 year old female referred to PT s/p lumbar L5-S1 fusion with impairments listed below.  At this time pt is very motivated to continue with her exercises on her own and will progress well with an updated HEP 1x/week x3 weeks.  Provided pt with [employer] handout for 2 free weeks.   Pt will benefit from skilled therapeutic intervention in order to improve on the following  deficits: Pain;Decreased strength;Improper body mechanics;Impaired perceived functional ability;Decreased coordination Rehab Potential: Good PT Frequency: Min 1X/week PT Duration:  (3 weeks) PT Treatment/Interventions: Therapeutic activities;Therapeutic exercise;Balance training;Neuromuscular  re-education;Functional mobility training;Patient/family education PT Plan: Progress towards quadruped activities, and theraband.  Focus on advancing HEP. continue to educate and perform approprriate lifting techniques to maintain back percautions.      Goals Home Exercise Program Pt/caregiver will Perform Home Exercise Program: Independently PT Goal: Perform Home Exercise Program - Progress: Goal set today PT Short Term Goals Time to Complete Short Term Goals: 3 weeks PT Short Term Goal 1: Pt will improve core coordination and demonstrate independent TrA and multifidus activation to progress towards approprriate posture.   PT Short Term Goal 2: Pt will improve LE strength by 1 muscle grade in order to tolerate ambulating for greater than 1 hour.   Problem List Patient Active Problem List     Diagnosis  Date Noted   .  Status post lumbar spinal fusion  05/07/2013   .  Hypotension, unspecified  02/05/2013   .  ARF (acute renal failure)  02/05/2013   .  Hyponatremia  02/05/2013   .  Abdominal pain  02/05/2013   .  TOBACCO ABUSE  05/07/2009   .  LEG PAIN, BILATERAL  05/07/2009   .  HEPATITIS C  05/04/2009   .  DEPRESSION  05/04/2009   .  ACID REFLUX DISEASE  05/04/2009   .  UTI  05/04/2009   .  EDEMA LEG  05/04/2009    Threasa [name], MPT, ATC 05/06/2013, 11:09 AM   Request ID [id number] Page 1 [place] [facility]PART[facility] OF HEALTH AND HUMAN SERVICES --- DIVISION OF MEDICAL ASSISTANCE PRIOR AUTHORIZATION ([place]) REQUEST FOR OUTPATIENT SPECIALIZED THERAPY SERVICES REQUEST ID [id number] CREATED 05/07/2013 SUBMITTED 05/07/2013 REQUESTOR Annett Fabian Physical Therapist PHONE [phone] FAX [phone] PT Initial Request Routine Medicaid BENEFICIARY [facility] [address] [place], [place] [id number] [place] [id number] P DOB 11/08/1960 PLACE OF SERVICE Outpatient Clinic START/ADMIT DATE 05/07/2013 DATE ORDER SIGNED OR VERBAL ORDER RECIEVED 04/25/2013 ORDER EFFECTIVE DATE REQUESTING  PROVIDER  HEALTH SYSTEM NPI [id number] SERVICE PROVIDER Same as Requesting Provider REFERRING PROVIDER DIAGNOSIS ONSET DATE Medical 8105 - Drsl/dslmb fus post/post 02/04/2013 Treatment 724.2 - Lumbago 02/04/2013 SERVICE CODE FREQUENCY DURATION Physical Therapy Visit 1 per week 3 weeks TOTAL UNITS: 3 DATE OF EVALUATION 05/06/2013 RELEVANT HOSPITAL DISCHARGE DATE 02/11/2013 REASON FOR HOSPITALIZATION Other - Bilateral L4-L5, L5-S1 diskectomy; decompression of the thecal sac; decompression of the L4-L5, L5-S1 nerve roots; interbody fusion with cages; pedicle screws L4, L5, S1; posterolateral arthrodesis; diskectomy beyond normal to be able to introduce the cage. Request ID [id number] Page 2 [employer] DEPARTMENT OF HEALTH AND HUMAN SERVICES --- DIVISION OF MEDICAL ASSISTANCE PRIOR AUTHORIZATION ([place]) REQUEST FOR OUTPATIENT SPECIALIZED THERAPY SERVICES THERAPY HISTORY No known therapy for this problem REASON FOR REFERRAL Beneficiary has a new injury, disease or condition PRIOR LEVEL OF FUNCTION Independent/Modified Independent with all ADLs WILL PATIENT/CAREGIVER EDUCATION BE PROVIDERD AND A HOME PROGRAM IMPLEMENTED? Yes TARGETED DISCHARGE DATE 05/28/2013 COMMENTS Clinical Impression Statement: Pt is a 52 year old female referred to PT s/p lumbar L5-S1 fusion with impairments listed below. At this time pt is very motivated to continue with her exercises on her own and will progress well with an updated HEP 1x/week x3 weeks. Provided pt with [employer] handout for 2 free weeks. Pt will benefit from skilled therapeutic intervention in order to improve on the following deficits: Pain;Decreased strength;Improper body mechanics;Impaired perceived functional ability;Decreased  coordination Rehab Potential: Good PT Frequency: Min 1X/week PT Duration: (3 weeks) PT Treatment/Interventions: Therapeutic activities;Therapeutic exercise;Balance training;Neuromuscular  re-education;Functional mobility training;Patient/family education Request ID [id number] Page 3 [place] DEPARTMENT OF HEALTH AND HUMAN SERVICES --- DIVISION OF MEDICAL ASSISTANCE PRIOR AUTHORIZATION ([place]) REQUEST FOR OUTPATIENT SPECIALIZED THERAPY SERVICES GOAL # PRIMARY TREATMENT GOAL(S) CURRENT STATUS 1 Independent with HEP given 2 Pt will improve core coordination and demonstrate independent TrA and multifidus activation to progress towards approprriate posture Impaired to transverse abdominus (TrA) and multifidus musculature and requires moderate PT faciliation 3 Pt will improve LE strength by 1 muscle grade in order to tolerate ambulating for greater than 1 hour. RLE Strength Right Hip Flexion: 3+/5 Right Hip Extension: 3+/5 Right Hip ABduction: 4/5 Right Hip ADduction: 4/5 LLE Strength Left Hip Flexion: 4/5 Left Hip Extension: 4/5 Left Hip ABduction: 5/5 Left Hip ADduction: 5/5 4 Pt will report lumbar pain with rolling less than a 3/10 for improved QOL. Pain with rolling 5/10 Sent By Date No additional info for this request.  Physician Documentation Your signature is required to indicate approval of the treatment plan as stated above.  Please sign and either send electronically or make a copy of this report for your files and return this physician signed original.   Please mark one 1.__approve of plan  2. ___approve of plan with the following conditions.   ______________________________                                                          _____________________ Physician Signature                                                                                                             Date

## 2013-05-07 DIAGNOSIS — Z981 Arthrodesis status: Secondary | ICD-10-CM | POA: Insufficient documentation

## 2013-05-13 ENCOUNTER — Ambulatory Visit (HOSPITAL_COMMUNITY)
Admission: RE | Admit: 2013-05-13 | Discharge: 2013-05-13 | Disposition: A | Discharge status: Home / Self Care | Payer: Medicaid Other | Source: Ambulatory Visit | Attending: Neurosurgery | Admitting: Neurosurgery

## 2013-05-13 NOTE — Progress Notes (Addendum)
Physical Therapy Treatment Patient Details  Name: Katelyn [name] MRN: [id number] Date of Birth: 05-06-1961  Today's Date: 05/13/2013 Time: 1300-1342 PT Time Calculation (min): 42 min  Visit#: 2 of 4  Re-eval: 06/06/13 Authorization: Medicaid  Authorization Visit#: 2 of 4  Charges:  therex 40'  Subjective: Symptoms/Limitations Symptoms: pt states she had 6/10 pain this morning in her lumbar area when she woke.  States it hurts worse when she lies flat. Pain Assessment Currently in Pain?: Yes Pain Score: 6  Pain Location: Back   Exercise/Treatments Standing Heel Raises: 10 reps;Limitations Heel Raises Limitations: toeraises 10 reps Functional Squats: 10 reps Side Lunge: 10 reps Supine Ab Set: 10 reps;5 seconds Clam: 10 reps Clam Limitations: alternating with ab set Bent Knee Raise: 10 reps Bent Knee Raise Limitations: w/ab set. alternating BLE Bridge: 10 reps Straight Leg Raise: 10 reps Straight Leg Raises Limitations: BLE  Other Supine Lumbar Exercises: shoulder flexion w/ab set 10 reps Sidelying Hip Abduction: 10 reps Prone  Single Arm Raise: 10 reps Straight Leg Raise: 10 reps Opposite Arm/Leg Raise: 5 reps Other Prone Lumbar Exercises: heelsqueezes 10x5"     Physical Therapy Assessment and Plan PT Assessment and Plan Clinical Impression Statement: Progressed with standing exercises with good form.  Pt able to complete all mat exercises independently.  Still unable to clear bottom with bridging; instructed with prone heelsqueeze to help increase glut strength.  Pt with one visit remaining then plans to transition to [facility]. Pt will benefit from skilled therapeutic intervention in order to improve on the following deficits: Pain;Decreased strength;Improper body mechanics;Impaired perceived functional ability;Decreased coordination PT Duration:  (3 weeks) PT Plan: Next session Instruct in postural three theraband exercises, give tband and written instructions.   Review standing exercises and instruct in quadruped exercise progression from prone. Last visit Instruct in proper lifting/ body mechanics.  Insure pt has all written HEP instructions; re-eval X 2 more visits.      Problem List Patient Active Problem List   Diagnosis Date Noted  . Status post lumbar spinal fusion 05/07/2013  . Hypotension, unspecified 02/05/2013  . ARF (acute renal failure) 02/05/2013  . Hyponatremia 02/05/2013  . Abdominal pain 02/05/2013  . TOBACCO ABUSE 05/07/2009  . LEG PAIN, BILATERAL 05/07/2009  . HEPATITIS C 05/04/2009  . DEPRESSION 05/04/2009  . ACID REFLUX DISEASE 05/04/2009  . UTI 05/04/2009  . EDEMA LEG 05/04/2009      Lurena Nida, PTA/CLT 05/13/2013, 1:52 PM

## 2013-05-20 ENCOUNTER — Ambulatory Visit (HOSPITAL_COMMUNITY)
Admission: RE | Admit: 2013-05-20 | Discharge: 2013-05-20 | Disposition: A | Discharge status: Home / Self Care | Payer: Medicaid Other | Source: Ambulatory Visit | Attending: Internal Medicine | Admitting: Internal Medicine

## 2013-05-20 DIAGNOSIS — Z981 Arthrodesis status: Secondary | ICD-10-CM

## 2013-05-20 NOTE — Progress Notes (Signed)
Physical Therapy Treatment Patient Details  Name: Katelyn [name] MRN: [id number] Date of Birth: 1960-10-17  Today's Date: 05/20/2013 Time: 1016-1101 PT Time Calculation (min): 45 min Charge: TE [id number]  Visit#: 3 of 4  Re-eval: 06/06/13 Assessment Diagnosis: Lumbar  Surgical Date: 02/04/13 Next MD Visit: Jeral Fruit - 3 months  Authorization: Medicaid  Authorization Time Period:    Authorization Visit#: 3 of 4   Subjective: Symptoms/Limitations Symptoms: Pt states she has been doing good then had to take her son to hospital in [place] and had to stop 5 times returning home.  Reports pain increased when pushing down gas paddle, increased radicular symptoms down Rt LE.  Pain Assessment Currently in Pain?: Yes Pain Score: 8  Pain Location: Hip Pain Orientation: Right Pain Radiating Towards: Rt LE to foot  Precautions/Restrictions  Precautions Precautions: Back Required Braces or Orthoses: Spinal Brace Spinal Brace: Lumbar corset Restrictions Weight Bearing Restrictions: No  Exercise/Treatments Standing Heel Raises: 15 reps;Limitations Heel Raises Limitations: toeraises 15 reps Functional Squats: 15 reps Side Lunge: 10 reps;5 seconds Scapular Retraction: Both;10 reps;Theraband Theraband Level (Scapular Retraction): Level 4 (Blue) Row: Both;10 reps;Theraband Theraband Level (Row): Level 4 (Blue) Shoulder Extension: Both;10 reps;Theraband Theraband Level (Shoulder Extension): Level 4 (Blue) Supine Ab Set: 10 reps;Limitations AB Set Limitations: TrA 10" holds with tactile cueing Bridge: 10 reps Straight Leg Raise: 10 reps Straight Leg Raises Limitations: BLE  Other Supine Lumbar Exercises: Mutlifidus 10x10" Sidelying Hip Abduction: 15 reps;Weights Hip Abduction Weights (lbs): 2 Prone  Single Arm Raise: 10 reps Straight Leg Raise: 10 reps Opposite Arm/Leg Raise: Right arm/Left leg;Left arm/Right leg;10 reps    Physical Therapy Assessment and Plan PT Assessment  and Plan Clinical Impression Statement: Began postural strengthening, pt able to demonstrate appropraite form and technique following cueing.  Pt given HEP worksheet and theraband to add postural strengtheing to HEP.  Glut strength improving, pt able to clear bottom with bridges this session.  Pt reported pain reduced at end of session.  Pt does plan to join the [facility] next session. PT Plan:  Review standing exercises and instruct in quadruped exercise progression from prone. Instruct in proper lifting/ body mechanics.  Insure pt has all written HEP instructions; re-eval.     Goals Home Exercise Program Pt/caregiver will Perform Home Exercise Program: Independently PT Short Term Goals Time to Complete Short Term Goals: 3 weeks PT Short Term Goal 1: Pt will improve core coordination and demonstrate independent TrA and multifidus activation to progress towards approprriate posture.  PT Short Term Goal 2: Pt will improve LE strength by 1 muscle grade in order to tolerate ambulating for greater than 1 hour.  PT Short Term Goal 2 - Progress: Progressing toward goal  Problem List Patient Active Problem List   Diagnosis Date Noted  . Status post lumbar spinal fusion 05/07/2013  . Hypotension, unspecified 02/05/2013  . ARF (acute renal failure) 02/05/2013  . Hyponatremia 02/05/2013  . Abdominal pain 02/05/2013  . TOBACCO ABUSE 05/07/2009  . LEG PAIN, BILATERAL 05/07/2009  . HEPATITIS C 05/04/2009  . DEPRESSION 05/04/2009  . ACID REFLUX DISEASE 05/04/2009  . UTI 05/04/2009  . EDEMA LEG 05/04/2009    PT - End of Session Activity Tolerance: Patient tolerated treatment well General Behavior During Therapy: [facility] for tasks assessed/performed  GP    Juel Burrow 05/20/2013, 11:04 AM

## 2013-05-27 ENCOUNTER — Ambulatory Visit (HOSPITAL_COMMUNITY): Payer: Medicaid Other | Admitting: Physical Therapy

## 2013-05-29 ENCOUNTER — Other Ambulatory Visit: Payer: Self-pay

## 2013-06-03 ENCOUNTER — Ambulatory Visit (HOSPITAL_COMMUNITY)
Admission: RE | Admit: 2013-06-03 | Discharge: 2013-06-03 | Disposition: A | Discharge status: Home / Self Care | Payer: Medicaid Other | Source: Ambulatory Visit | Attending: Internal Medicine | Admitting: Internal Medicine

## 2013-06-03 DIAGNOSIS — M51379 Other intervertebral disc degeneration, lumbosacral region without mention of lumbar back pain or lower extremity pain: Secondary | ICD-10-CM | POA: Insufficient documentation

## 2013-06-03 DIAGNOSIS — M5137 Other intervertebral disc degeneration, lumbosacral region: Secondary | ICD-10-CM | POA: Insufficient documentation

## 2013-06-03 DIAGNOSIS — Z981 Arthrodesis status: Secondary | ICD-10-CM | POA: Insufficient documentation

## 2013-06-03 DIAGNOSIS — IMO0001 Reserved for inherently not codable concepts without codable children: Secondary | ICD-10-CM | POA: Insufficient documentation

## 2013-06-03 NOTE — Progress Notes (Addendum)
Physical Therapy Treatment Patient Details  Name: Virgilia Quigg MRN: [id number] Date of Birth: 07/05/1961  Today's Date: 06/03/2013 Time: 7829-5621 PT Time Calculation (min): 23 min Charges: 1 MMT TE: [id number] Visit#: 4 of 4  Re-eval: 06/06/13 Assessment Diagnosis: Lumbar  Surgical Date: 02/04/13 Next MD Visit: Jeral Fruit - 3 months  Authorization: Medicaid  Authorization Time Period:    Authorization Visit#: 4 of 4   Subjective: Symptoms/Limitations Symptoms: Pt reports that she was really sore after riding about 30 minutes to [place]. She now feels better to her hips.  She has been doing her exercise at home.  How long can you walk comfortably?: 20-30 minutes Pain Assessment Currently in Pain?: No/denies  Precautions/Restrictions  Precautions Precautions: Back  RLE Strength  Right Hip Flexion 4/5 (was 3+/5)  Right Hip Extension 4/5 (was 3+/5)  Right Hip ABduction 5/5 (was 4/5)  Right Hip ADduction 5/5 (was 4/5)  LLE Strength  Left Hip Flexion 5/5 (was 4/5)  Left Hip Extension 5/5 (was 4/5)  Left Hip ABduction 5/5 (was 5/5)  Left Hip ADduction 5/5 (was 5/5)   Exercise/Treatments Standing Heel Raises: 15 reps Scapular Retraction: Both;Theraband;15 reps Theraband Level (Scapular Retraction): Level 4 (Blue) Row: Both;Theraband;15 reps Theraband Level (Row): Level 4 (Blue) Shoulder Extension: Both;Theraband;15 reps Theraband Level (Shoulder Extension): Level 4 (Blue) Shoulder ADduction: Both;15 reps;Theraband Theraband Level (Shoulder Adduction): Level 4 (Blue) Sidelying Other Sidelying Lumbar Exercises: Side plank x10 reps BLE Prone  Other Prone Lumbar Exercises: plants 3x10 sec holds   Physical Therapy Assessment and Plan PT Assessment and Plan Clinical Impression Statement: Ms. Boran attended 4 allowed OP PT visits s/p lumbar fusion with following findings: pt is now independent with HEP.  She plans to start at the [facility] next week to continue with her  exercises.  Her pain is well controlled.  She is ambulation for 20-30 minutes comfortably.  At this time will d/c from PT secondary to Medicaid limiations.  PT Plan: D/C    Goals Home Exercise Program Pt/caregiver will Perform Home Exercise Program: Independently PT Goal: Perform Home Exercise Program - Progress: Met PT Short Term Goals Time to Complete Short Term Goals: 3 weeks PT Short Term Goal 1: Pt will improve core coordination and demonstrate independent TrA and multifidus activation to progress towards approprriate posture.  PT Short Term Goal 1 - Progress: Met PT Short Term Goal 2: Pt will improve LE strength by 1 muscle grade in order to tolerate ambulating for greater than 1 hour.  PT Short Term Goal 2 - Progress: Progressing toward goal  Problem List Patient Active Problem List   Diagnosis Date Noted  . Status post lumbar spinal fusion 05/07/2013  . Hypotension, unspecified 02/05/2013  . ARF (acute renal failure) 02/05/2013  . Hyponatremia 02/05/2013  . Abdominal pain 02/05/2013  . TOBACCO ABUSE 05/07/2009  . LEG PAIN, BILATERAL 05/07/2009  . HEPATITIS C 05/04/2009  . DEPRESSION 05/04/2009  . ACID REFLUX DISEASE 05/04/2009  . UTI 05/04/2009  . EDEMA LEG 05/04/2009    PT - End of Session Activity Tolerance: Patient tolerated treatment well General Behavior During Therapy: [facility] for tasks assessed/performed  GP    Calina Patrie, MPT, ATC 06/03/2013, 3:50 PM

## 2013-06-26 ENCOUNTER — Other Ambulatory Visit (HOSPITAL_COMMUNITY): Payer: Self-pay | Admitting: "Endocrinology

## 2013-06-26 DIAGNOSIS — E0590 Thyrotoxicosis, unspecified without thyrotoxic crisis or storm: Secondary | ICD-10-CM

## 2013-07-01 ENCOUNTER — Encounter (HOSPITAL_COMMUNITY)
Admission: RE | Admit: 2013-07-01 | Discharge: 2013-07-01 | Disposition: A | Discharge status: Home / Self Care | Payer: Medicaid Other | Source: Ambulatory Visit | Attending: "Endocrinology | Admitting: "Endocrinology

## 2013-07-01 ENCOUNTER — Encounter (HOSPITAL_COMMUNITY): Payer: Self-pay

## 2013-07-01 DIAGNOSIS — E0590 Thyrotoxicosis, unspecified without thyrotoxic crisis or storm: Secondary | ICD-10-CM | POA: Insufficient documentation

## 2013-07-01 MED ORDER — SODIUM IODIDE I 131 CAPSULE
9.0000 | Freq: Once | INTRAVENOUS | Status: AC | PRN
  Administered 2013-07-01: 9 via ORAL

## 2013-07-02 ENCOUNTER — Telehealth: Payer: Self-pay | Admitting: *Deleted

## 2013-07-02 ENCOUNTER — Encounter (HOSPITAL_COMMUNITY)
Admission: RE | Admit: 2013-07-02 | Discharge: 2013-07-02 | Disposition: A | Discharge status: Home / Self Care | Payer: Medicaid Other | Source: Ambulatory Visit | Attending: "Endocrinology | Admitting: "Endocrinology

## 2013-07-02 DIAGNOSIS — R634 Abnormal weight loss: Secondary | ICD-10-CM | POA: Insufficient documentation

## 2013-07-02 DIAGNOSIS — R5381 Other malaise: Secondary | ICD-10-CM | POA: Insufficient documentation

## 2013-07-02 DIAGNOSIS — E0590 Thyrotoxicosis, unspecified without thyrotoxic crisis or storm: Secondary | ICD-10-CM | POA: Insufficient documentation

## 2013-07-02 DIAGNOSIS — R002 Palpitations: Secondary | ICD-10-CM | POA: Insufficient documentation

## 2013-07-02 MED ORDER — IRBESARTAN-HYDROCHLOROTHIAZIDE 300-12.5 MG PO TABS: 1.0000 | ORAL_TABLET | Freq: Every day | ORAL | Status: DC

## 2013-07-02 MED ORDER — SODIUM PERTECHNETATE TC 99M INJECTION
10.0000 | Freq: Once | INTRAVENOUS | Status: AC | PRN
  Administered 2013-07-02: 10 via INTRAVENOUS

## 2013-07-02 NOTE — Telephone Encounter (Signed)
Left VM for patient to call back from 8-4  Need to change Diovan HCT 160-12.5mg  QD to irbesartan 200-12.5mg  QD as insurance won't cover.

## 2013-07-02 NOTE — Telephone Encounter (Signed)
Called patient's home number - spoke with patient's mother & left information regarding medication change with her and informed that new medicine had been sent to pharmacy.   D/C diovan-hct 160-12.5mg  QD to irbesartan-hydrochlorothiazide 300-12.5mg  QD

## 2013-07-24 HISTORY — PX: BACK SURGERY: SHX140

## 2013-07-30 ENCOUNTER — Telehealth: Payer: Self-pay | Admitting: Internal Medicine

## 2013-07-30 NOTE — Telephone Encounter (Signed)
Calling because she was told that Dr.Hilty was changing her bp Medication because medicaid will not cover it and the pharmacy has not receive the new prescription .Marland KitchenMarland KitchenPlease call   Thanks

## 2013-07-30 NOTE — Telephone Encounter (Signed)
Forward to [employer] RN

## 2013-07-31 MED ORDER — IRBESARTAN-HYDROCHLOROTHIAZIDE 300-12.5 MG PO TABS: 1.0000 | ORAL_TABLET | Freq: Every day | ORAL | Status: DC

## 2013-07-31 NOTE — Telephone Encounter (Signed)
Rx was sent to pharmacy electronically. 

## 2013-07-31 NOTE — Telephone Encounter (Signed)
PA for avalide faxed in by Dominica Severin, LPN

## 2013-08-15 ENCOUNTER — Encounter: Payer: Self-pay | Admitting: *Deleted

## 2013-08-15 ENCOUNTER — Other Ambulatory Visit: Payer: Self-pay | Admitting: *Deleted

## 2013-08-15 ENCOUNTER — Telehealth: Payer: Self-pay | Admitting: *Deleted

## 2013-08-15 MED ORDER — IRBESARTAN-HYDROCHLOROTHIAZIDE 300-12.5 MG PO TABS: 1.0000 | ORAL_TABLET | Freq: Every day | ORAL | Status: DC

## 2013-08-15 MED ORDER — DIOVAN HCT 160-12.5 MG PO TABS: 1.0000 | ORAL_TABLET | Freq: Every day | ORAL | Status: DC

## 2013-08-15 NOTE — Telephone Encounter (Signed)
Rx was sent to pharmacy electronically. 

## 2013-08-15 NOTE — Telephone Encounter (Signed)
Called patient to notify of PA issues - pharmacy stated Diovan-HCT needed prior approval, along with irbesartan-hctz, valsartan-hctz regardless of [place] information. Informed patient that prior authorization process is still underway

## 2013-08-15 NOTE — Telephone Encounter (Signed)
Pt stated that she has been up to the pharmacy about 5 times in 6 weeks because her medication has not been called in. She stated that Dr. Debara Pickett changed her medication and she does not know what it was. Medicaid would not pay for it and she has been waiting on a replacement for the 6 weeks. She stated that she has called [place] several times and no one has called her back.  [place]

## 2013-08-15 NOTE — Telephone Encounter (Signed)
[place] tracks preferred drug list showed Diovan HCT (brand name) as preferred drug.. Over previously prescribed valsartan-hct and irbesartan-hctz (which was medication with pending prior auth r/t having to change medications due to insurance coverage)

## 2013-08-15 NOTE — Telephone Encounter (Signed)
Message forwarded to J. Arelia Sneddon, RN to f/u with pt on status of PA.

## 2013-09-03 ENCOUNTER — Encounter: Payer: Self-pay | Admitting: Internal Medicine

## 2013-09-03 ENCOUNTER — Ambulatory Visit (INDEPENDENT_AMBULATORY_CARE_PROVIDER_SITE_OTHER): Payer: Medicaid Other | Admitting: Internal Medicine

## 2013-09-03 VITALS — BP 170/80 | HR 73 | Ht 64.0 in | Wt 184.0 lb

## 2013-09-03 DIAGNOSIS — R609 Edema, unspecified: Secondary | ICD-10-CM

## 2013-09-03 DIAGNOSIS — G609 Hereditary and idiopathic neuropathy, unspecified: Secondary | ICD-10-CM

## 2013-09-03 DIAGNOSIS — R600 Localized edema: Secondary | ICD-10-CM | POA: Insufficient documentation

## 2013-09-03 DIAGNOSIS — G629 Polyneuropathy, unspecified: Secondary | ICD-10-CM | POA: Insufficient documentation

## 2013-09-03 DIAGNOSIS — I10 Essential (primary) hypertension: Secondary | ICD-10-CM

## 2013-09-03 MED ORDER — VALSARTAN 160 MG PO TABS: 160.0000 mg | ORAL_TABLET | Freq: Every day | ORAL | Status: DC

## 2013-09-03 NOTE — Progress Notes (Signed)
OFFICE NOTE  Chief Complaint:  High blood pressure, leg swelling  Primary Care Physician: Katelyn [name]., MD  HPI:  Katelyn [name] is a 53 year old female who was referred to me for evaluation of shortness of breath. She was hypertensive and had LVH on an echocardiogram that was performed recently. I recommended adding Diovan HCTZ 160/12.5 to her regimen. That actually has worked quite well. Her blood pressure came down to 130/80 and in general has been very well controlled. Her weight is fairly stable at 176, up from 174, but she is exercising very regularly several times a week. She has worked on smoking cessation and has cut back to 5 cigarettes a day - she says that she quit 2 months ago. He underwent back surgery this past summer, and postoperatively was complicated by low blood pressure, acute renal throat are, possible infection and a number of other complications which she was unclear about. Subsequently she reports she is run out of her blood pressure medications and her blood pressure is running quite high today the 947M systolic. She's also complaining of leg pain which has persisted, throbbing, aching and tender to touch. She has swelling in her legs but develops throughout the day it is worse at night and improves after elevation. She's been taking Lasix for this with minimal improvement.  PMHx:  Past Medical History  Diagnosis Date  . Depression   . Panic attack   . Hypertension   . Hypercholesteremia   . Goiter   . PONV (postoperative nausea and vomiting)   . GERD (gastroesophageal reflux disease)   . Degenerative disc disease, lumbar   . Hepatitis   . Hepatitis C infection 2004    cured with treatment of interferon and ribavirin    Past Surgical History  Procedure Laterality Date  . Total shoulder arthroplasty    . Foot surgery    . Neck surgery    . Tonsillectomy    . Colonoscopy N/A 10/14/2012    Procedure: COLONOSCOPY;  Surgeon: Daneil Dolin, MD;   [facility];  Service: Endoscopy;  Laterality: N/A;  9:30 AM    FAMHx:  Family History  Problem Relation Age of Onset  . Colon cancer Neg Hx     SOCHx:   reports that she quit smoking about 2 months ago. Her smoking use included Cigarettes. She has a 17.5 pack-year smoking history. She has never used smokeless tobacco. She reports that she does not drink alcohol or use illicit drugs.  ALLERGIES:  Allergies  Allergen Reactions  . Ancef [Cefazolin] Swelling    Pt had vague constriction of throat post op. Not sure it was abx. But could be so proceed cautiously with cephalosporins   . Sulfa Antibiotics Nausea And Vomiting and Rash    ROS: A comprehensive review of systems was negative except for: Constitutional: positive for fatigue Respiratory: positive for dyspnea on exertion Cardiovascular: positive for lower extremity edema Musculoskeletal: positive for back pain and leg pain  HOME MEDS: Current Outpatient Prescriptions  Medication Sig Dispense Refill  . atorvastatin (LIPITOR) 20 MG tablet Take 20 mg by mouth daily.      Marland Kitchen FLUoxetine (PROZAC) 40 MG capsule Take 40 mg by mouth daily.        . furosemide (LASIX) 20 MG tablet Take 20 mg by mouth daily.      Marland Kitchen HYDROcodone-acetaminophen (NORCO) 10-325 MG per tablet Take 1 tablet by mouth every 6 (six) hours as needed.      Marland Kitchen  meloxicam (MOBIC) 15 MG tablet Take 15 mg by mouth daily.      . methimazole (TAPAZOLE) 5 MG tablet Take 5 mg by mouth daily.      . potassium chloride SA (K-DUR,KLOR-CON) 20 MEQ tablet Take 20 mEq by mouth daily.      . Sodium Fluoride (PREVIDENT DT) Place onto teeth daily.      Marland Kitchen tolterodine (DETROL) 1 MG tablet Take 1 mg by mouth daily.      . valsartan (DIOVAN) 160 MG tablet Take 1 tablet (160 mg total) by mouth daily.  30 tablet  6   No current facility-administered medications for this visit.    LABS/IMAGING: No results found for this or any previous visit (from the past 48 hour(s)). No  results found.  VITALS: BP 170/80  Pulse 73  Ht 5\' 4"  (1.626 m)  Wt 184 lb (83.462 kg)  BMI 31.57 kg/m2  EXAM: General appearance: alert and no distress Neck: no carotid bruit and no JVD Lungs: clear to auscultation bilaterally Heart: regular rate and rhythm, S1, S2 normal, no murmur, click, rub or gallop Abdomen: soft, non-tender; bowel sounds normal; no masses,  no organomegaly Extremities: edema 1+ bilateral pitting edema Pulses: 2+ and symmetric Skin: dependent rubor Neurologic: Mental status: Alert, oriented, thought content appropriate, there is decreased sensation to touch in the lower extremities Psych: Mood, affect normal  EKG: Normal sinus rhythm at 73  ASSESSMENT: 1. Lower extremity edema likely secondary to neuropathy 2. Suspected peripheral neuropathy, possibly related to lumbar spinal problems 3. Hypertension-uncontrolled  PLAN: 1.   Katelyn [name] continues to have uncontrolled hypertension and "ran out of her medicines". She reports that her insurance coverage would not pay for any combination medication such as an antihypertensive and diuretic periods and she is ready on Lasix, would recommend she start back on Diovan 160 mg daily. She will need a recheck of her blood pressure by Katelyn [name] our hypertension specialist in 1 to 2 weeks. I've also measured her for bilateral knee-high 20-30 mmHg compression stockings and recommended that she wears them throughout the day when she is on her feet and takes them off at night. This should help with some of her swelling as well. I deferred her neuropathic pain workup to her primary care provider, however a trial period with gabapentin or Lyrica may be helpful for some of the pain symptoms in her legs- he may also benefit from nerve conduction studies if she's not had this before.  Katelyn Casino, MD, [facility] Attending Cardiologist [facility]  Katelyn [name] C 09/03/2013, 1:48 PM

## 2013-09-03 NOTE — Patient Instructions (Addendum)
Your physician has recommended you make the following change in your medication: START valsartan 160mg  daily.   Please wear compression stockings - put on first thing in morning and remove at night. R calf 12 inch R ankle 7 inch L calf 12 inch L ankle 7 inch  Your physician recommends that you schedule a follow-up appointment in 1-2 weeks with Katelyn [name] for a blood pressure check.   Your physician wants you to follow-up in: 6 months with Dr. Debara [name]. You will receive a reminder letter in the mail two months in advance. If you don't receive a letter, please call our office to schedule the follow-up appointment.

## 2013-09-05 ENCOUNTER — Telehealth: Payer: Self-pay | Admitting: *Deleted

## 2013-09-05 ENCOUNTER — Other Ambulatory Visit: Payer: Self-pay | Admitting: *Deleted

## 2013-09-05 MED ORDER — VALSARTAN 160 MG PO TABS: 160.0000 mg | ORAL_TABLET | Freq: Every day | ORAL | Status: DC

## 2013-09-05 NOTE — Telephone Encounter (Signed)
Talked to pharmacist and medication was at the pharmacy but pt needs PA done , but had not been faxed over yet ; I asked pharmacist to give pt. Enough to get her through the weekrnd and he said he would.

## 2013-09-05 NOTE — Telephone Encounter (Signed)
Pt stated that she went to [facility] in [place] [phone]  and they did not have her BP medication and she stated that she needs it because her BP 191/100 about an hour ago. She does not know that medication that she was put on.  [place]

## 2013-09-17 ENCOUNTER — Ambulatory Visit: Payer: Medicaid Other | Admitting: Pharmacist Clinician (PhC)/ Clinical Pharmacy Specialist

## 2013-09-17 ENCOUNTER — Encounter: Payer: Medicaid Other | Admitting: Pharmacist Clinician (PhC)/ Clinical Pharmacy Specialist

## 2013-09-24 ENCOUNTER — Ambulatory Visit (INDEPENDENT_AMBULATORY_CARE_PROVIDER_SITE_OTHER): Payer: Medicaid Other | Admitting: Pharmacist Clinician (PhC)/ Clinical Pharmacy Specialist

## 2013-09-24 ENCOUNTER — Encounter: Payer: Self-pay | Admitting: Pharmacist Clinician (PhC)/ Clinical Pharmacy Specialist

## 2013-09-24 VITALS — BP 166/84 | HR 76 | Ht 64.0 in | Wt 179.5 lb

## 2013-09-24 DIAGNOSIS — I10 Essential (primary) hypertension: Secondary | ICD-10-CM

## 2013-09-24 MED ORDER — LISINOPRIL-HYDROCHLOROTHIAZIDE 20-12.5 MG PO TABS: 1.0000 | ORAL_TABLET | Freq: Every day | ORAL | Status: DC

## 2013-09-24 NOTE — Patient Instructions (Signed)
Return for a a follow up appointment in 3-4 weeks  Your blood pressure today is high at 166/84  Check your blood pressure at home daily (if able) and keep record of the readings.  Take your BP meds as follows: start lisinopril hct 20/12.5mg  each day  Bring all of your meds, your BP cuff and your record of home blood pressures to your next appointment.  Exercise as you're able, try to walk approximately 30 minutes per day.  Keep salt intake to a minimum, especially watch canned and prepared boxed foods.  Eat more fresh fruits and vegetables and fewer canned items.  Avoid eating in fast food restaurants.    HOW TO TAKE YOUR BLOOD PRESSURE:   Rest 5 minutes before taking your blood pressure.    Don't smoke or drink caffeinated beverages for at least 30 minutes before.   Take your blood pressure before (not after) you eat.   Sit comfortably with your back supported and both feet on the floor (don't cross your legs).   Elevate your arm to heart level on a table or a desk.   Use the proper sized cuff. It should fit smoothly and snugly around your bare upper arm. There should be enough room to slip a fingertip under the cuff. The bottom edge of the cuff should be 1 inch above the crease of the elbow.   Ideally, take 3 measurements at one sitting and record the average.

## 2013-09-24 NOTE — Progress Notes (Signed)
09/24/2013 Katelyn [name] Aug 10, 1960 [id number]   HPI:  Katelyn [name] is a 53 y.o. female patient of Dr Debara Pickett, with a PMH below who presents today for a blood pressure check.  She saw Dr. Debara Pickett on February 11 and had a blood pressure of 170/80.  She had previously been on Diovan HCT but had not been able to get it thru Medicaid.  Apparently several different prescriptions were called to the pharmacy, but none were covered by Medicaid.  So she has not taken anything for her hypertension since that visit.  She did check her pressure several times at the pharmacy near her home, and it was consistently elevated (180s/100s).  She quit smoking in December, drinks alcohol only rarely and drinks 10-12 oz of coffee each day.  She has been going to a gym recently, but has only been able to do some weight training due to problems with her feet.  She apparently has posterior tibial tendon dysfunction and is wearing braces on both legs.  She has also been wearing support hose and states that her leg swelling has gone down considerably.     Current Outpatient Prescriptions  Medication Sig Dispense Refill  . atorvastatin (LIPITOR) 20 MG tablet Take 20 mg by mouth daily.      Marland Kitchen FLUoxetine (PROZAC) 40 MG capsule Take 40 mg by mouth daily.        . furosemide (LASIX) 20 MG tablet Take 20 mg by mouth daily.      Marland Kitchen HYDROcodone-acetaminophen (NORCO) 10-325 MG per tablet Take 1 tablet by mouth every 6 (six) hours as needed.      . methimazole (TAPAZOLE) 5 MG tablet Take 5 mg by mouth daily.      . potassium chloride SA (K-DUR,KLOR-CON) 20 MEQ tablet Take 20 mEq by mouth daily.      Marland Kitchen lisinopril-hydrochlorothiazide (PRINZIDE,ZESTORETIC) 20-12.5 MG per tablet Take 1 tablet by mouth daily.  30 tablet  1  . Sodium Fluoride (PREVIDENT DT) Place onto teeth daily.      Marland Kitchen tolterodine (DETROL) 1 MG tablet Take 1 mg by mouth daily.       No current facility-administered medications for this visit.    Allergies    Allergen Reactions  . Ancef [Cefazolin] Swelling    Pt had vague constriction of throat post op. Not sure it was abx. But could be so proceed cautiously with cephalosporins   . Sulfa Antibiotics Nausea And Vomiting and Rash    Past Medical History  Diagnosis Date  . Depression   . Panic attack   . Hypertension   . Hypercholesteremia   . Goiter   . PONV (postoperative nausea and vomiting)   . GERD (gastroesophageal reflux disease)   . Degenerative disc disease, lumbar   . Hepatitis   . Hepatitis C infection 2004    cured with treatment of interferon and ribavirin    Blood pressure 166/84, pulse 76, height 5\' 4"  (1.626 m), weight 179 lb 8 oz (81.421 kg).   ASSESSMENT AND PLAN:  Today her blood pressure is still elevated.  I will start her on lisinopril hct 20/12.5 once daily as it is covered by Medicaid.  I would have liked to use amlodipine, but as she has had problems with swelling in her legs I don't want to take the chance that amlodipine might increase swelling.  She is eager to know what else can be done to keep her BP down, and we reviewed the need to continue  exercise on a regular basis as well as keep salt intake to a minimum.  I will see her back in 3 weeks for a follow up appointment to see if there is any improvement.  At that time I will have her get a BMET drawn to check kidney function.  She may have had labs drawn elsewhere recently, especially as she had back surgery last year, but we don't have any of those.    Tommy Medal PharmD CPP [facility]

## 2013-09-30 ENCOUNTER — Encounter (HOSPITAL_COMMUNITY): Payer: Medicaid Other

## 2013-10-17 ENCOUNTER — Ambulatory Visit (INDEPENDENT_AMBULATORY_CARE_PROVIDER_SITE_OTHER): Payer: Medicaid Other | Admitting: Pharmacist Clinician (PhC)/ Clinical Pharmacy Specialist

## 2013-10-17 VITALS — BP 130/60 | Ht 64.0 in | Wt 177.2 lb

## 2013-10-17 DIAGNOSIS — I10 Essential (primary) hypertension: Secondary | ICD-10-CM

## 2013-10-17 LAB — BASIC METABOLIC PANEL
BUN: 20 mg/dL (ref 6–23)
CALCIUM: 9.6 mg/dL (ref 8.4–10.5)
CO2: 27 mEq/L (ref 19–32)
Chloride: 100 mEq/L (ref 96–112)
Creat: 0.84 mg/dL (ref 0.50–1.10)
Glucose, Bld: 94 mg/dL (ref 70–99)
POTASSIUM: 4 meq/L (ref 3.5–5.3)
SODIUM: 137 meq/L (ref 135–145)

## 2013-10-17 NOTE — Patient Instructions (Signed)
Your blood pressure today is 130/60   Check your blood pressure at home daily (if able) and keep record of the readings.  Take your BP meds as follows: lisinopril hctz 20/12.5 mg once daily   Exercise as you're able, try to walk approximately 30 minutes per day.  Keep salt intake to a minimum, especially watch canned and prepared boxed foods.  Eat more fresh fruits and vegetables and fewer canned items.  Avoid eating in fast food restaurants.    HOW TO TAKE YOUR BLOOD PRESSURE:   Rest 5 minutes before taking your blood pressure.    Don't smoke or drink caffeinated beverages for at least 30 minutes before.   Take your blood pressure before (not after) you eat.   Sit comfortably with your back supported and both feet on the floor (don't cross your legs).   Elevate your arm to heart level on a table or a desk.   Use the proper sized cuff. It should fit smoothly and snugly around your bare upper arm. There should be enough room to slip a fingertip under the cuff. The bottom edge of the cuff should be 1 inch above the crease of the elbow.   Ideally, take 3 measurements at one sitting and record the average.

## 2013-10-20 ENCOUNTER — Telehealth: Payer: Self-pay | Admitting: Pharmacist Clinician (PhC)/ Clinical Pharmacy Specialist

## 2013-10-20 ENCOUNTER — Encounter: Payer: Self-pay | Admitting: Pharmacist Clinician (PhC)/ Clinical Pharmacy Specialist

## 2013-10-20 DIAGNOSIS — I10 Essential (primary) hypertension: Secondary | ICD-10-CM

## 2013-10-20 MED ORDER — POTASSIUM CHLORIDE ER 10 MEQ PO TBCR: 10.0000 meq | EXTENDED_RELEASE_TABLET | Freq: Every day | ORAL | Status: DC

## 2013-10-20 MED ORDER — LISINOPRIL-HYDROCHLOROTHIAZIDE 20-12.5 MG PO TABS: 1.0000 | ORAL_TABLET | Freq: Every day | ORAL | Status: DC

## 2013-10-20 NOTE — Telephone Encounter (Signed)
Pt in office for BP check on Friday, had BMET drawn afterward.  Potassium low, will start her on 98mEq x 3 days then 10-mEq qd.  Will mail lab orders to patient to repeat BMET in 2-3 weeks

## 2013-10-20 NOTE — Telephone Encounter (Signed)
Thanks Chad!

## 2013-10-20 NOTE — Progress Notes (Signed)
10/20/2013 Katelyn [name] [birth year] [id number]   HPI:  Katelyn [name] is a 53 y.o. female patient of Dr Debara Pickett, with a PMH below who presents today for a return blood pressure check.  She saw Dr. Debara Pickett on February 11 and had a blood pressure of 170/80.  She had previously been on Diovan HCT but had not been able to get it thru Medicaid.  Apparently several different prescriptions were called to the pharmacy, but none were covered by Medicaid.  When I saw her last on March 4, she had not taken any blood pressure medication for several weeks.  She has continued to check her pressure several times at the pharmacy near her home, and has noticed a great improvement, mostly in the 825-053Z systolic  She quit smoking in December, drinks alcohol only rarely and drinks 10-12 oz of coffee each day.  She continues to go to the gym, but is only able to do some weight training due to problems with her feet.  She apparently has posterior tibial tendon dysfunction and is wearing braces on both legs.  She has also been wearing support hose.  She states recently there has been an increase in swelling of her ankles and feet.     Current Outpatient Prescriptions  Medication Sig Dispense Refill  . atorvastatin (LIPITOR) 20 MG tablet Take 20 mg by mouth daily.      Marland Kitchen FLUoxetine (PROZAC) 40 MG capsule Take 40 mg by mouth daily.        . furosemide (LASIX) 20 MG tablet Take 20 mg by mouth daily.      Marland Kitchen HYDROcodone-acetaminophen (NORCO) 10-325 MG per tablet Take 1 tablet by mouth every 6 (six) hours as needed.      Marland Kitchen lisinopril-hydrochlorothiazide (PRINZIDE,ZESTORETIC) 20-12.5 MG per tablet Take 1 tablet by mouth daily.  30 tablet  1  . methimazole (TAPAZOLE) 5 MG tablet Take 5 mg by mouth daily.      . potassium chloride SA (K-DUR,KLOR-CON) 20 MEQ tablet Take 20 mEq by mouth daily.      . Sodium Fluoride (PREVIDENT DT) Place onto teeth daily.      Marland Kitchen tolterodine (DETROL) 1 MG tablet Take 1 mg by mouth daily.         No current facility-administered medications for this visit.    Allergies  Allergen Reactions  . Ancef [Cefazolin] Swelling    Pt had vague constriction of throat post op. Not sure it was abx. But could be so proceed cautiously with cephalosporins   . Sulfa Antibiotics Nausea And Vomiting and Rash    Past Medical History  Diagnosis Date  . Depression   . Panic attack   . Hypertension   . Hypercholesteremia   . Goiter   . PONV (postoperative nausea and vomiting)   . GERD (gastroesophageal reflux disease)   . Degenerative disc disease, lumbar   . Hepatitis   . Hepatitis C infection 2004    cured with treatment of interferon and ribavirin    Blood pressure 130/60, height 5\' 4"  (1.626 m), weight 177 lb 3.2 oz (80.377 kg).   ASSESSMENT AND PLAN:  Today her blood pressure is well controlled.  She has not had any problems getting the lisinopril hct from the pharmacy and has been compliant with taking the medication.  She is happy to have the BP under control.  As for the ankle swelling, I have asked her to increase her furosemide from 40mg  to 80mg  for 3 days then  resume 40mg  qd.  She is to call the nurse if the swelling issue doesn't resolve after that.  She was sent to the lab today for a BMET.  I have encouraged her to keep up with her healthy diet and lifestyle and to call if her BP increases significantly  Tommy Medal PharmD CPP [place]

## 2013-11-21 ENCOUNTER — Telehealth: Payer: Self-pay | Admitting: Pharmacist Clinician (PhC)/ Clinical Pharmacy Specialist

## 2013-11-21 MED ORDER — FUROSEMIDE 20 MG PO TABS: ORAL_TABLET | ORAL | Status: DC

## 2013-11-21 NOTE — Telephone Encounter (Signed)
Pt LMOM, at previous visit we had suggested she increase lasix to 2 tabs daily x 3 days for increased swelling.  Now unable to get refill and out of med.  Sent Rx to pt pharmacy for increase to 45 tabs, LMOM for pt to inform her of this

## 2013-12-10 ENCOUNTER — Other Ambulatory Visit (HOSPITAL_COMMUNITY): Payer: Self-pay | Admitting: Physician Assistant

## 2013-12-10 DIAGNOSIS — Z1231 Encounter for screening mammogram for malignant neoplasm of breast: Secondary | ICD-10-CM

## 2013-12-11 ENCOUNTER — Ambulatory Visit (HOSPITAL_COMMUNITY)
Admission: RE | Admit: 2013-12-11 | Discharge: 2013-12-11 | Disposition: A | Discharge status: Home / Self Care | Payer: Medicaid Other | Source: Ambulatory Visit | Attending: Physician Assistant | Admitting: Physician Assistant

## 2013-12-11 DIAGNOSIS — Z1231 Encounter for screening mammogram for malignant neoplasm of breast: Secondary | ICD-10-CM | POA: Insufficient documentation

## 2013-12-22 ENCOUNTER — Encounter: Payer: Self-pay | Admitting: *Deleted

## 2013-12-23 ENCOUNTER — Other Ambulatory Visit: Payer: Self-pay | Admitting: Advanced Practice Midwife

## 2013-12-25 ENCOUNTER — Other Ambulatory Visit: Payer: Self-pay | Admitting: Internal Medicine

## 2013-12-25 NOTE — Telephone Encounter (Signed)
Rx was sent to pharmacy electronically. 

## 2014-01-07 ENCOUNTER — Other Ambulatory Visit: Payer: Self-pay | Admitting: Advanced Practice Midwife

## 2014-02-18 ENCOUNTER — Ambulatory Visit (INDEPENDENT_AMBULATORY_CARE_PROVIDER_SITE_OTHER): Payer: Medicaid Other | Admitting: Advanced Practice Midwife

## 2014-02-18 ENCOUNTER — Other Ambulatory Visit (HOSPITAL_COMMUNITY)
Admission: RE | Admit: 2014-02-18 | Discharge: 2014-02-18 | Disposition: A | Discharge status: Home / Self Care | Payer: Medicaid Other | Source: Ambulatory Visit | Attending: Advanced Practice Midwife | Admitting: Advanced Practice Midwife

## 2014-02-18 ENCOUNTER — Encounter: Payer: Self-pay | Admitting: Advanced Practice Midwife

## 2014-02-18 VITALS — BP 126/72 | Ht 65.0 in | Wt 170.0 lb

## 2014-02-18 DIAGNOSIS — Z01419 Encounter for gynecological examination (general) (routine) without abnormal findings: Secondary | ICD-10-CM | POA: Insufficient documentation

## 2014-02-18 DIAGNOSIS — Z1212 Encounter for screening for malignant neoplasm of rectum: Secondary | ICD-10-CM

## 2014-02-18 DIAGNOSIS — Z1151 Encounter for screening for human papillomavirus (HPV): Secondary | ICD-10-CM | POA: Diagnosis present

## 2014-02-18 DIAGNOSIS — Z0000 Encounter for general adult medical examination without abnormal findings: Secondary | ICD-10-CM

## 2014-02-18 DIAGNOSIS — K649 Unspecified hemorrhoids: Secondary | ICD-10-CM

## 2014-02-18 NOTE — Progress Notes (Signed)
Katelyn [name] 53 y.o.  Filed Vitals:   02/18/14 1416  BP: 126/72     Past Medical History: Past Medical History  Diagnosis Date  . Depression   . Panic attack   . Hypertension   . Hypercholesteremia   . Goiter   . PONV (postoperative nausea and vomiting)   . GERD (gastroesophageal reflux disease)   . Degenerative disc disease, lumbar   . Hepatitis   . Hepatitis C infection 2004    cured with treatment of interferon and ribavirin    Past Surgical History: Past Surgical History  Procedure Laterality Date  . Total shoulder arthroplasty    . Foot surgery    . Neck surgery    . Tonsillectomy    . Colonoscopy N/A 10/14/2012    Procedure: COLONOSCOPY;  Surgeon: Daneil Dolin, MD;  [facility]: [facility];  Service: Endoscopy;  Laterality: N/A;  9:30 AM  . Back surgery      Family History: Family History  Problem Relation Age of Onset  . Colon cancer Neg Hx   . Osteoporosis Mother   . Cancer Father     lung  . Other Sister     HEP C  . Cancer Maternal Grandmother     breast  . Cancer Maternal Grandfather     kidney    Social History: History  Substance Use Topics  . Smoking status: Current Every Day Smoker -- 1.00 packs/day for 35 years    Types: Cigarettes    Last Attempt to Quit: 07/03/2013  . Smokeless tobacco: Never Used  . Alcohol Use: Yes     Comment: RARELY    Allergies:  Allergies  Allergen Reactions  . Ancef [Cefazolin] Swelling    Pt had vague constriction of throat post op. Not sure it was abx. But could be so proceed cautiously with cephalosporins   . Sulfa Antibiotics Nausea And Vomiting and Rash     Current outpatient prescriptions:atorvastatin (LIPITOR) 20 MG tablet, Take 20 mg by mouth daily., Disp: , Rfl: ;  FLUoxetine (PROZAC) 40 MG capsule, Take 40 mg by mouth daily.  , Disp: , Rfl: ;  furosemide (LASIX) 20 MG tablet, TAKE (1) OR (2) TABLETS BY MOUTH DAILY AS DIRECTED., Disp: 45 tablet, Rfl: 6;  HYDROcodone-acetaminophen (NORCO)  10-325 MG per tablet, Take 1 tablet by mouth every 6 (six) hours as needed., Disp: , Rfl:  lisinopril-hydrochlorothiazide (PRINZIDE,ZESTORETIC) 20-12.5 MG per tablet, Take 1 tablet by mouth daily., Disp: 90 tablet, Rfl: 2;  methimazole (TAPAZOLE) 5 MG tablet, Take 5 mg by mouth daily., Disp: , Rfl: ;  potassium chloride (K-DUR) 10 MEQ tablet, Take 1 tablet (10 mEq total) by mouth daily., Disp: 90 tablet, Rfl: 3;  potassium chloride SA (K-DUR,KLOR-CON) 20 MEQ tablet, Take 20 mEq by mouth daily., Disp: , Rfl:  Sodium Fluoride (PREVIDENT DT), Place onto teeth daily., Disp: , Rfl: ;  tolterodine (DETROL) 1 MG tablet, Take 1 mg by mouth daily., Disp: , Rfl:   History of Present Illness: Here for well woman exam. Has had hemorrhoids for 27 years and wants them gone (pain with BMS, bleeding) Review of Systems   Patient denies any headaches, blurred vision, shortness of breath, chest pain, abdominal pain,problems iwth urination, or intercourse.   Physical Exam: General:  Well developed, well nourished, no acute distress Skin:  Warm and dry Neck:  Midline trachea, normal thyroid Lungs; Clear to auscultation bilaterally Breast:  No dominant palpable mass, retraction, or nipple discharge Cardiovascular: Regular rate  and rhythm Abdomen:  Soft, non tender, no hepatosplenomegaly Pelvic:  External genitalia is normal in appearance.  The vagina is normal in appearance.  The cervix is bulbous.  Uterus is felt to be normal size, shape, and contour.  No adnexal masses or tenderness noted. Rectal: Good sphincter tone, no polyps, or hemorrhoids felt.  No external hemorrhoids noticed.  Guiac negative  Had colonscopy 4 years ago  Extremities:  No swelling or varicosities noted Psych:  No mood changes   Impression:  Normal GYN exam   Plan: Pap q 3 years if normal. Mammogram q year (had one 5/15) Referral to Dr. Oneida Alar for ? banding

## 2014-02-20 ENCOUNTER — Encounter: Payer: Self-pay | Admitting: Internal Medicine

## 2014-02-23 LAB — CYTOLOGY - PAP

## 2014-03-06 ENCOUNTER — Other Ambulatory Visit: Payer: Self-pay | Admitting: Orthopedic Surgery

## 2014-03-06 ENCOUNTER — Ambulatory Visit (HOSPITAL_COMMUNITY)
Admission: RE | Admit: 2014-03-06 | Discharge: 2014-03-06 | Disposition: A | Discharge status: Home / Self Care | Payer: Medicaid Other | Source: Ambulatory Visit | Attending: Orthopedic Surgery | Admitting: Orthopedic Surgery

## 2014-03-06 DIAGNOSIS — M79605 Pain in left leg: Secondary | ICD-10-CM

## 2014-03-06 DIAGNOSIS — Z981 Arthrodesis status: Secondary | ICD-10-CM | POA: Insufficient documentation

## 2014-03-06 DIAGNOSIS — M79609 Pain in unspecified limb: Secondary | ICD-10-CM | POA: Insufficient documentation

## 2014-03-06 DIAGNOSIS — M25562 Pain in left knee: Secondary | ICD-10-CM

## 2014-03-06 DIAGNOSIS — M25561 Pain in right knee: Secondary | ICD-10-CM

## 2014-03-06 DIAGNOSIS — M79604 Pain in right leg: Secondary | ICD-10-CM

## 2014-03-06 DIAGNOSIS — M25569 Pain in unspecified knee: Secondary | ICD-10-CM | POA: Diagnosis not present

## 2014-03-10 ENCOUNTER — Ambulatory Visit (INDEPENDENT_AMBULATORY_CARE_PROVIDER_SITE_OTHER): Payer: Medicaid Other | Admitting: Orthopedic Surgery

## 2014-03-10 VITALS — BP 123/78 | Ht 65.0 in | Wt 170.0 lb

## 2014-03-10 DIAGNOSIS — M79609 Pain in unspecified limb: Secondary | ICD-10-CM

## 2014-03-10 DIAGNOSIS — M79605 Pain in left leg: Principal | ICD-10-CM

## 2014-03-10 DIAGNOSIS — M79604 Pain in right leg: Secondary | ICD-10-CM

## 2014-03-10 NOTE — Progress Notes (Signed)
Chief Complaint  Patient presents with  . Leg Pain    bilateral knee pain radiates down legs to feet x 1 year, no known injury    Patient is referred by [place] and physician assistant Mr. Collene Mares  52 year old female status post lumbar fusion a year ago by Dr. Joya Salm presents with 2 years history of excruciating foot pain radiating from her feet into her knees associated with giving out stiffness throbbing stabbing and aching which is rated at 10 despite being on hydrocodone 10/325 every 6 hours.  She's had right foot surgery she is followed by podiatry she's had a shoulder repair neck fusion tonsillectomy adenoidectomy tubal ligation  She takes lisinopril with hydrochlorothiazide furosemide potassium oxybutynin methimazole hydrocodone fluoxetine vitamin D and atorvastatin  Allergic to sulfa and Ancef. Family history of diabetes lung disease asthma and hepatitis hypertension cancer arthritis osteoporosis depression thyroid disease and kidney disease. Her mother is alive her father is deceased  Review of systems as been recorded. Review of systems has been recorded reviewed and signed and scanned into the chart  BP 123/78  Ht 5\' 5"  (1.651 m)  Wt 170 lb (77.111 kg)  BMI 28.29 kg/m2 Very pleasant female small frame oriented x3 mood pleasant gait without a significant or noticeable limp  Her knees look good there is no joint effusion there is no tenderness there is no soft tissue swelling. She has full range of motion. Both knees are stable. She has normal muscle tone. McMurray signs are negative. Skin is intact without rash. Bilateral normal pulses and sensation without swelling of the lymph nodes.  X-rays show minimal if any arthritic changes.  Impression most of her pain is in her foot and it radiates proximally to the knee  I have referred her back to her primary care physician, podiatrist and neurosurgeon for further workup as there are no orthopedic  issues with her knees

## 2014-03-10 NOTE — Patient Instructions (Signed)
Return to primary care doctor

## 2014-03-17 ENCOUNTER — Ambulatory Visit (INDEPENDENT_AMBULATORY_CARE_PROVIDER_SITE_OTHER): Payer: Medicaid Other | Admitting: Internal Medicine

## 2014-03-17 ENCOUNTER — Encounter: Payer: Self-pay | Admitting: Internal Medicine

## 2014-03-17 VITALS — BP 114/66 | HR 76 | Temp 97.2°F | Ht 65.0 in | Wt 166.2 lb

## 2014-03-17 DIAGNOSIS — Z86010 Personal history of colonic polyps: Secondary | ICD-10-CM

## 2014-03-17 DIAGNOSIS — B368 Other specified superficial mycoses: Secondary | ICD-10-CM

## 2014-03-17 DIAGNOSIS — K648 Other hemorrhoids: Secondary | ICD-10-CM

## 2014-03-17 DIAGNOSIS — B362 White piedra: Secondary | ICD-10-CM

## 2014-03-17 NOTE — Progress Notes (Signed)
Primary Care Physician:  Glo Herring., MD Primary Gastroenterologist:  Dr. Gala Romney  Pre-Procedure History & Physical: HPI:  Katelyn [name] is a 53 y.o. female here for "hemorrhoidal" problem. Notes some anorectal swelling and prolapsing question hemorrhoids from time to time. Has an urge to have a bowel movement for 5 times daily does not well on the toilet. May or may not have a bowel movement in one to 2 minutes. He gets off the commode. No fiber supplement occasional MiraLax. Colonic adenoma her colon last year no significant anorectal abnormalities noted at that time. This has some sore which issues no recent bleeding. Pressure as her main concern. Also, some difficulty evacuating along with some itching.  Past Medical History  Diagnosis Date  . Depression   . Panic attack   . Hypertension   . Hypercholesteremia   . Goiter   . PONV (postoperative nausea and vomiting)   . GERD (gastroesophageal reflux disease)   . Degenerative disc disease, lumbar   . Hepatitis   . Hepatitis C infection 2004    cured with treatment of interferon and ribavirin  . Tubular adenoma 10/14/12    Past Surgical History  Procedure Laterality Date  . Total shoulder arthroplasty    . Foot surgery    . Neck surgery    . Tonsillectomy    . Colonoscopy N/A 10/14/2012    Dr.Renesmee Raine-  normal rectum, 3 polyps in the cecum- the largest being approximately 5 mm in diameter. the remainder of the colonic mucosa appeared normal. bx= tubular adenoma  . Back surgery      Prior to Admission medications   Medication Sig Start Date End Date Taking? Authorizing Provider  ARIPiprazole (ABILIFY) 2 MG tablet Take 2 mg by mouth daily.   Yes Historical Provider, MD  atorvastatin (LIPITOR) 20 MG tablet Take 20 mg by mouth daily.   Yes Historical Provider, MD  FLUoxetine (PROZAC) 40 MG capsule Take 40 mg by mouth daily.     Yes Historical Provider, MD  furosemide (LASIX) 20 MG tablet TAKE (1) OR (2) TABLETS BY MOUTH  DAILY AS DIRECTED. 12/25/13  Yes Pixie Casino, MD  HYDROcodone-acetaminophen (NORCO) 10-325 MG per tablet Take 1 tablet by mouth every 6 (six) hours as needed.   Yes Historical Provider, MD  lisinopril-hydrochlorothiazide (PRINZIDE,ZESTORETIC) 20-12.5 MG per tablet Take 1 tablet by mouth daily. 10/20/13  Yes Pixie Casino, MD  methimazole (TAPAZOLE) 5 MG tablet Take 5 mg by mouth daily.   Yes Historical Provider, MD  oxybutynin (DITROPAN) 5 MG tablet Take 5 mg by mouth daily.   Yes Historical Provider, MD  potassium chloride (K-DUR) 10 MEQ tablet Take 1 tablet (10 mEq total) by mouth daily. 10/20/13  Yes Pixie Casino, MD  potassium chloride SA (K-DUR,KLOR-CON) 20 MEQ tablet Take 20 mEq by mouth daily.   Yes Historical Provider, MD  Sodium Fluoride (PREVIDENT DT) Place onto teeth daily.    Historical Provider, MD  tolterodine (DETROL) 1 MG tablet Take 1 mg by mouth daily.    Historical Provider, MD    Allergies as of 03/17/2014 - Review Complete 02/18/2014  Allergen Reaction Noted  . Ancef [cefazolin] Swelling 02/06/2013  . Sulfa antibiotics Nausea And Vomiting and Rash 04/26/2011    Family History  Problem Relation Age of Onset  . Colon cancer Neg Hx   . Osteoporosis Mother   . Cancer Father     lung  . Other Sister     HEP C  .  Cancer Maternal Grandmother     breast  . Cancer Maternal Grandfather     kidney    History   Social History  . Marital Status: Divorced    Spouse Name: N/A    Number of Children: N/A  . Years of Education: N/A   Occupational History  . Not on file.   Social History Main Topics  . Smoking status: Current Every Day Smoker -- 1.00 packs/day for 35 years    Types: Cigarettes    Last Attempt to Quit: 07/03/2013  . Smokeless tobacco: Never Used  . Alcohol Use: Yes     Comment: RARELY  . Drug Use: No  . Sexual Activity: No   Other Topics Concern  . Not on file   Social History Narrative  . No narrative on file    Review of  Systems: See HPI, otherwise negative ROS  Physical Exam: BP 114/66  Pulse 76  Temp(Src) 97.2 F (36.2 C) (Oral)  Ht 5\' 5"  (1.651 m)  Wt 166 lb 3.2 oz (75.388 kg)  BMI 27.66 kg/m2 General:   Alert,  Well-developed, well-nourished, pleasant and cooperative in NAD Skin:  Intact without significant lesions or rashes. Eyes:  Sclera clear, no icterus.   Conjunctiva pink. Ears:  Normal auditory acuity. Nose:  No deformity, discharge,  or lesions. Mouth:  No deformity or lesions. Neck:  Supple; no masses or thyromegaly. No significant cervical adenopathy. Lungs:  Clear throughout to auscultation.   No wheezes, crackles, or rhonchi. No acute distress. Heart:  Regular rate and rhythm; no murmurs, clicks, rubs,  or gallops. Abdomen: Non-distended, normal bowel sounds.  Soft and nontender without appreciable mass or hepatosplenomegaly.  Pulses:  Normal pulses noted. Extremities:  Without clubbing or edema. Rectal exam:  Slightly red around the anal orifice no protruding hemorrhoids digital exam moderate sphincter tone. No other abnormalities appreciated. Impression:  Pleasant 53 year old lady with a history colonic adenoma with last year-due for surveillance colonoscopy in 4 years from now. Now also complains of symptomatic hemorrhoidal disease. Possible mild tenia infection. Patient requests hemorrhoid banding.  Likely benefit from banding in she is desirous of this modality for treatment of hemorrhoids.. See separate dictation for procedure note    Notice: This dictation was prepared with Dragon dictation along with smaller phrase technology. Any transcriptional errors that result from this process are unintentional and may not be corrected upon review.

## 2014-03-17 NOTE — Patient Instructions (Signed)
Avoid straining.  Benefiber 2 teaspoons twice daily  MiraLax one capful at bedtime as needed for constipation  Lotrisone cream applied to the inner rectum 3 times daily  Limit toilet time to 2-3 minutes  Call with any interim problems  Schedule followup appointment in 2-3 weeks from now

## 2014-03-17 NOTE — Progress Notes (Signed)
[place] banding procedure note:  The patient presents with symptomatic grade 1 hemorrhoids; desires rubber band ligation. Has mild tinea infection of the anal orifice. . All risks, benefits, and alternative forms of therapy were described and informed consent was obtained. No 100% guarantee this will alleviate all of her anorectal symptoms. Also, hemorrhoidal recurrence reviewed.  In the left lateral decubitus position , digital rectal exam revealed no external hemorrhoids. Moderately good sphincter time. The decision was made to band the left lateral internal hemorrhoid;the [place] was used to perform band ligation without complication. Digital anorectal examination was then performed to assure proper positioning of the band;  no pinching or pain after deployment. Followup digital rectal exam revealed band to be in excellent position. The patient was discharged home without pain or other issues. Dietary and behavioral recommendations were given along with follow-up instructions. The patient will return in 2-3 weeks for followup and possible additional banding as required.  Lotrisone cream prescribed  No complications were encountered and the patient tolerated the procedure well.

## 2014-04-10 ENCOUNTER — Encounter: Payer: Self-pay | Admitting: Podiatrist

## 2014-04-10 ENCOUNTER — Ambulatory Visit (INDEPENDENT_AMBULATORY_CARE_PROVIDER_SITE_OTHER): Payer: Medicaid Other

## 2014-04-10 ENCOUNTER — Ambulatory Visit (INDEPENDENT_AMBULATORY_CARE_PROVIDER_SITE_OTHER): Payer: Medicaid Other | Admitting: Podiatrist

## 2014-04-10 VITALS — BP 124/67 | HR 78 | Resp 16 | Ht 64.5 in | Wt 165.0 lb

## 2014-04-10 DIAGNOSIS — M7742 Metatarsalgia, left foot: Secondary | ICD-10-CM

## 2014-04-10 DIAGNOSIS — M779 Enthesopathy, unspecified: Secondary | ICD-10-CM

## 2014-04-10 DIAGNOSIS — M722 Plantar fascial fibromatosis: Secondary | ICD-10-CM

## 2014-04-10 DIAGNOSIS — M898X9 Other specified disorders of bone, unspecified site: Secondary | ICD-10-CM

## 2014-04-10 DIAGNOSIS — M7741 Metatarsalgia, right foot: Secondary | ICD-10-CM

## 2014-04-10 NOTE — Progress Notes (Signed)
   Subjective:    Patient ID: Katelyn [name], female    DOB: [birth year], 53 y.o.   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 Treatment used - Dr Doran Durand fused the right 1st toe per pt, then Dr. Caprice Beaver of  injected with steroids, then Dr. Melony Overly near [place] prescribed the TED hose and the braces.  Pt complains of constant pounding pain over entire area of both feet, when weight bearing and severe swelling, and calloused areas at pressure point B/L 1 and 5th MPJs.  Pt has a darkened speckled lesion on right 3rd toe, that has been frozen off.  Foot Pain      Review of Systems  Musculoskeletal: Positive for back pain.  All other systems reviewed and are negative.      Objective:   Physical Exam GENERAL APPEARANCE: Alert, conversant. Appropriately groomed. No acute distress.  VASCULAR: Pedal pulses palpable at 2/4 DP and PT bilateral.  Capillary refill time is immediate to all digits,  Proximal to distal cooling it warm to warm.  Digital hair growth is present bilateral  NEUROLOGIC: sensation is intact epicritically and protectively to 5.07 monofilament at 5/5 sites bilateral.  Light touch is intact bilateral, vibratory sensation intact bilateral, achilles tendon reflex is intact bilateral.  MUSCULOSKELETAL: Fat pad is noted to bilateral feet. Diffuse pain along the plantar arch, plantar fascial insertion bilateral, Achilles tendon, and forefoot is noted bilateral. Generalized discomfort along the posterior tibial tendon at its insertion is also noted. Intrinsic muscluature intact bilateral.  Rectus appearance of foot and digits noted bilateral. Prominent plantarflexed first and fifth metatarsals noted bilateral  DERMATOLOGIC: Mild calluses present submetatarsal one and 5 bilateral.  Small speck of darkened skin noted at the distal tip of the right third toe where previous verruca has been frozen off.  Otherwise skin color, texture, and turger are within normal limits.  No preulcerative lesions are seen, no interdigital maceration noted.  No open lesions present.  Digital nails are asymptomatic.   X-rays show inferior calcaneal spurring. There is also a plate and screws from previous fusion at the first metatarsophalangeal joint of the right foot.    Assessment & Plan:  Tendinitis, posterior tibial tendinitis, metatarsalgia, fat pad atrophy  Plan: The patient has already tried extensive conservative treatment she's had extensive steroid injections, surgery at the first metatarsophalangeal joint, orthotics, night splint, icing. The orthotics do appear to be quite rigid therefore recommended replacing the top cover with something a bit softer. I will try spenco over her existing inserts.  She will see how these do. In reality I discussed likely she's always going to have pain in her feet and we will do our best to make it tolerable.

## 2014-04-10 NOTE — Patient Instructions (Signed)

## 2014-04-14 ENCOUNTER — Encounter: Payer: Self-pay | Admitting: Internal Medicine

## 2014-04-14 ENCOUNTER — Telehealth: Payer: Self-pay | Admitting: *Deleted

## 2014-04-14 ENCOUNTER — Ambulatory Visit (INDEPENDENT_AMBULATORY_CARE_PROVIDER_SITE_OTHER): Payer: Medicaid Other | Admitting: Internal Medicine

## 2014-04-14 VITALS — BP 118/70 | HR 68 | Temp 97.6°F | Ht 64.0 in | Wt 168.4 lb

## 2014-04-14 DIAGNOSIS — K648 Other hemorrhoids: Secondary | ICD-10-CM

## 2014-04-14 NOTE — Patient Instructions (Signed)
Avoid straining.  Start Benefiber low dose and then gradually increased to 2 teaspoons twice daily  Limit toilet time to 2-3 minutes  Call with any interim problems  Schedule followup appointment in 2-3 weeks from now

## 2014-04-14 NOTE — Progress Notes (Signed)
[place] banding procedure note:  The patient presents with symptomatic grade 2-3 hemorrhoids; status post banding in the left lateral hemorrhoid column recently. Her symptoms have globally improved but not totally resolved. Takes "Gummies" as a fiber supplement. Did not like taking Benefiber but feels it's in her "mind". She's improved and desires another band placed today. All risks, benefits, and alternative forms of therapy were described and informed consent was obtained.  In the left lateral decubitus position, a digital rectal exam revealed no abnormalities.  The decision was made to band the  Right anterior internal hemorrhoid column;the [place] was used to perform band ligation without complication. Digital anorectal examination was then performed to assure proper positioning of the band; patient had no pinching or pain following deployment.  Band found to be in excellent position. The patient was discharged home without pain or other issues. Dietary and behavioral recommendations were given. The patient will return in 2-3 weeks for followup and possible additional banding as required.  No complications were encountered and the patient tolerated the procedure well.

## 2014-04-14 NOTE — Telephone Encounter (Signed)
I was in there on Friday and Dr. Valentina Lucks kept my insoles.  She was going to put something on them.  She wanted me to check back and see or are you all going to call me.  I hope you all are going to call me so I won't bug you.  I called and informed the patient that her orthotics are ready.  She stated that is the fastest I have ever seen for them to come back.  I told her I think Dr. Valentina Lucks did them herself.  She stated "Well okay, I'll be over to pick them up on tomorrow."

## 2014-04-21 ENCOUNTER — Encounter: Payer: Self-pay | Admitting: Podiatrist

## 2014-04-27 ENCOUNTER — Other Ambulatory Visit: Payer: Self-pay | Admitting: Internal Medicine

## 2014-04-27 NOTE — Telephone Encounter (Signed)
Rx was sent to pharmacy electronically. 

## 2014-05-04 ENCOUNTER — Encounter: Payer: Self-pay | Admitting: *Deleted

## 2014-05-07 ENCOUNTER — Ambulatory Visit: Payer: Medicaid Other | Admitting: Internal Medicine

## 2014-05-12 ENCOUNTER — Encounter: Payer: Self-pay | Admitting: Internal Medicine

## 2014-05-12 ENCOUNTER — Ambulatory Visit (INDEPENDENT_AMBULATORY_CARE_PROVIDER_SITE_OTHER): Payer: Medicaid Other | Admitting: Internal Medicine

## 2014-05-12 VITALS — BP 134/71 | HR 72 | Temp 97.3°F | Resp 18 | Ht 64.5 in | Wt 169.0 lb

## 2014-05-12 DIAGNOSIS — K649 Unspecified hemorrhoids: Secondary | ICD-10-CM | POA: Insufficient documentation

## 2014-05-12 DIAGNOSIS — K648 Other hemorrhoids: Secondary | ICD-10-CM

## 2014-05-12 NOTE — Progress Notes (Signed)
[facility] banding procedure note:  The patient presents with symptomatic grade 2 hemorrhoids; status post banding of the right anterior and left lateral, previously. She states it globally, all of her hemorrhoid symptoms have improved significantly. She is very happy. She only regrets not having this procedure done sooner. She is quite pleased with her progress; All risks, benefits, and alternative forms of therapy were described and informed consent was obtained.  In the left lateral decubitus position, a digital rectal exam revealed no abnormalities.  The decision was made to band the right posterior internal hemorrhoid; the [place] was used to perform band ligation without complication. Digital anorectal examination was then performed to assure proper positioning of the band; the band was found in the next position. No pinching or pain after deployment. The patient was discharged home without pain or other issues. Dietary and behavioral recommendations were given; . The patient will return in 6 months for followup.  No complications were encountered and the patient tolerated the procedure well.

## 2014-05-12 NOTE — Patient Instructions (Signed)
Avoid straining.  Benefiber 2 teaspoons twice daily  Limit toilet time to 2-3 minutes  Call with any interim problems  Schedule followup appointment with me in 6 months

## 2014-05-25 ENCOUNTER — Encounter: Payer: Self-pay | Admitting: Internal Medicine

## 2014-06-03 ENCOUNTER — Other Ambulatory Visit (HOSPITAL_COMMUNITY): Payer: Self-pay | Admitting: Family Medicine

## 2014-06-03 ENCOUNTER — Ambulatory Visit (HOSPITAL_COMMUNITY)
Admission: RE | Admit: 2014-06-03 | Discharge: 2014-06-03 | Disposition: A | Discharge status: Home / Self Care | Payer: Disability Insurance | Source: Ambulatory Visit | Attending: Family Medicine | Admitting: Family Medicine

## 2014-06-03 DIAGNOSIS — I10 Essential (primary) hypertension: Secondary | ICD-10-CM

## 2014-06-03 DIAGNOSIS — K589 Irritable bowel syndrome without diarrhea: Secondary | ICD-10-CM

## 2014-06-03 DIAGNOSIS — M545 Low back pain: Secondary | ICD-10-CM | POA: Insufficient documentation

## 2014-06-05 ENCOUNTER — Ambulatory Visit: Payer: Medicaid Other | Admitting: Internal Medicine

## 2014-06-17 ENCOUNTER — Ambulatory Visit: Payer: Medicaid Other | Admitting: Podiatrist

## 2014-07-01 ENCOUNTER — Ambulatory Visit: Payer: Medicaid Other | Admitting: Internal Medicine

## 2014-07-07 ENCOUNTER — Telehealth: Payer: Self-pay | Admitting: Internal Medicine

## 2014-07-07 DIAGNOSIS — M722 Plantar fascial fibromatosis: Secondary | ICD-10-CM

## 2014-07-07 NOTE — Telephone Encounter (Signed)
Close encounter 

## 2014-07-10 ENCOUNTER — Encounter: Payer: Self-pay | Admitting: Podiatrist

## 2014-07-10 ENCOUNTER — Ambulatory Visit (INDEPENDENT_AMBULATORY_CARE_PROVIDER_SITE_OTHER): Payer: Medicaid Other | Admitting: Podiatrist

## 2014-07-10 VITALS — BP 121/53 | HR 83 | Resp 16

## 2014-07-10 DIAGNOSIS — M779 Enthesopathy, unspecified: Secondary | ICD-10-CM

## 2014-07-10 DIAGNOSIS — M216X1 Other acquired deformities of right foot: Secondary | ICD-10-CM

## 2014-07-10 DIAGNOSIS — G629 Polyneuropathy, unspecified: Secondary | ICD-10-CM

## 2014-07-10 DIAGNOSIS — M7741 Metatarsalgia, right foot: Secondary | ICD-10-CM

## 2014-07-10 DIAGNOSIS — M7742 Metatarsalgia, left foot: Secondary | ICD-10-CM

## 2014-07-10 NOTE — Progress Notes (Signed)
   Chief Complaint  Patient presents with  . Foot Pain    Follow up posterior tibial tendonitis bilateral    "They really hurt"  . Plantar Warts    Follow up 3rd toe right   "The wart is back"     HPI: Patient is 53 y.o. female who presents today for calluses submetatarsal 1 and 5 of the right foot and distal tip of the right 3rd toe.  She states her posterior tibial tendonitis is painful however she is not ready to consider surgery in the area and is wearing ankle braces and states injections are moderately beneficial.    Allergies  Allergen Reactions  . Ancef [Cefazolin] Swelling    Pt had vague constriction of throat post op. Not sure it was abx. But could be so proceed cautiously with cephalosporins   . Sulfa Antibiotics Nausea And Vomiting and Rash    Physical Exam  Neurovascular status is intact and unchanged. Pain along the posterior tibial tendon especially on the right foot is noted. Calluses are noted submetatarsal 1 and 5 right and distal tip of the right third toe. Intact integument is present post debridement. Pronatory changes are seen clinically.  Assessment: Prominent plantar flexed metatarsals, calluses 3  Plan: Debridement of calluses was carried out today without complication. Recommended a more stable ankle brace and she'll be seen back as needed for follow-up.

## 2014-07-10 NOTE — Patient Instructions (Signed)
Corns and Calluses Corns are small areas of thickened skin that usually occur on the top, sides, or tip of a toe. They contain a cone-shaped core with a point that can press on a nerve below. This causes pain. Calluses are areas of thickened skin that usually develop on hands, fingers, palms, soles of the feet, and heels. These are areas that experience frequent friction or pressure. CAUSES  Corns are usually the result of rubbing (friction) or pressure from shoes that are too tight or do not fit properly. Calluses are caused by repeated friction and pressure on the affected areas. SYMPTOMS  A hard growth on the skin.  Pain or tenderness under the skin.  Sometimes, redness and swelling.  Increased discomfort while wearing tight-fitting shoes. DIAGNOSIS  Your caregiver can usually tell what the problem is by doing a physical exam. TREATMENT  Removing the cause of the friction or pressure is usually the only treatment needed. However, sometimes medicines can be used to help soften the hardened, thickened areas. These medicines include salicylic acid plasters and 12% ammonium lactate lotion. These medicines should only be used under the direction of your caregiver. HOME CARE INSTRUCTIONS   Try to remove pressure from the affected area.  You may wear donut-shaped corn pads to protect your skin.  You may use a pumice stone or nonmetallic nail file to gently reduce the thickness of a corn.  Wear properly fitted footwear.  If you have calluses on the hands, wear gloves during activities that cause friction.  If you have diabetes, you should regularly examine your feet. Tell your caregiver if you notice any problems with your feet. SEEK IMMEDIATE MEDICAL CARE IF:   You have increased pain, swelling, redness, or warmth in the affected area.  Your corn or callus starts to drain fluid or bleeds.  You are not getting better, even with treatment. Document Released: 04/15/2004 Document  Revised: 10/02/2011 Document Reviewed: 03/07/2011 Exit[employer] Patient Information 2015 [employer]. This information is not intended to replace advice given to you by your health care provider. Make sure you discuss any questions you have with your health care provider.  

## 2014-08-18 ENCOUNTER — Ambulatory Visit: Payer: Medicaid Other | Admitting: Internal Medicine

## 2014-08-26 ENCOUNTER — Encounter: Payer: Self-pay | Admitting: Internal Medicine

## 2014-09-02 ENCOUNTER — Other Ambulatory Visit: Payer: Self-pay | Admitting: Internal Medicine

## 2014-09-02 NOTE — Telephone Encounter (Signed)
Rx(s) sent to pharmacy electronically.  

## 2014-10-16 ENCOUNTER — Other Ambulatory Visit: Payer: Self-pay | Admitting: Internal Medicine

## 2014-10-16 NOTE — Telephone Encounter (Signed)
E-sent medication

## 2014-10-27 ENCOUNTER — Encounter: Payer: Self-pay | Admitting: Internal Medicine

## 2014-10-27 ENCOUNTER — Ambulatory Visit (INDEPENDENT_AMBULATORY_CARE_PROVIDER_SITE_OTHER): Payer: Medicaid Other | Admitting: Internal Medicine

## 2014-10-27 VITALS — BP 120/71 | HR 69 | Temp 97.9°F | Ht 64.0 in | Wt 171.0 lb

## 2014-10-27 DIAGNOSIS — K648 Other hemorrhoids: Secondary | ICD-10-CM

## 2014-10-27 NOTE — Progress Notes (Signed)
[place] banding procedure note:  Previously had all 3 hemorrhoid columns banded last year.-The last was the right posterior internal hemorrhoid. She states she left here and got locked out of her car. She had to crawl through a window to get into her car. She felt with this maneuver somehow caused the band not "take". She tells me, however, globally all over hemorrhoid symptoms including pressure leaking in [place], pain have all improved. She feels like she would benefit from another band. pressure leakage insert which have significantly improved She describes her persisting hemorrhoids grade 2.  The decision was made to place a band in a neutral position. This was done without difficulty. A digital rectal exam failed to reveal band placement. Consequently, I reloaded the applicator and approached the right posterior internal hemorrhoid and the [place] was used to perform band ligation without complication. Digital anorectal examination was then performed to assure proper positioning of the band;  The band foundto be in excellent position without pinching or pain. The patient was discharged home without pain or other issues. Dietary and behavioral recommendations were given; No complications were encountered and the patient tolerated the procedure well.  Followup appointment here in 2 months.

## 2014-10-27 NOTE — Patient Instructions (Signed)
Avoid straining.  Increase Benefiber to 2 teaspoons twice daily  Limit toilet time to 2-3 minutes  Call with any interim problems  Schedule followup appointment in 2 months from now

## 2014-12-01 ENCOUNTER — Other Ambulatory Visit (HOSPITAL_COMMUNITY): Payer: Self-pay | Admitting: Physician Assistant

## 2014-12-01 DIAGNOSIS — Z1231 Encounter for screening mammogram for malignant neoplasm of breast: Secondary | ICD-10-CM

## 2014-12-15 ENCOUNTER — Encounter: Payer: Self-pay | Admitting: Internal Medicine

## 2014-12-15 ENCOUNTER — Ambulatory Visit (INDEPENDENT_AMBULATORY_CARE_PROVIDER_SITE_OTHER): Payer: Medicaid Other | Admitting: Internal Medicine

## 2014-12-15 ENCOUNTER — Telehealth: Payer: Self-pay | Admitting: Internal Medicine

## 2014-12-15 VITALS — BP 138/80 | HR 72 | Temp 98.2°F | Ht 64.0 in | Wt 175.0 lb

## 2014-12-15 DIAGNOSIS — K648 Other hemorrhoids: Secondary | ICD-10-CM

## 2014-12-15 NOTE — Patient Instructions (Signed)
Avoid straining.  Benefiber 2-3  teaspoons twice daily;   Limit toilet time to 2-3 minutes  Call with any interim problems  Schedule followup appointment in 3 months

## 2014-12-15 NOTE — Addendum Note (Signed)
Addended by: Idamae Schuller on: 12/15/2014 11:59 AM   Modules accepted: Level of Service

## 2014-12-15 NOTE — Progress Notes (Signed)
[place] banding procedure note:  The patient presents with symptomatic grade 2 hemorrhoids;  Has had 4 bands placed in the past year - one probably fell off in the parking lot. Still has 1 persisting grade 2 hemorrhoid. Globally, her hemorrhoid symptoms have gotten better. Still feels she has to wipe excessively after a BM.  She's only taking Benefiber 2 teaspoons daily. She desires one more band be placed. This is not unreasonable.  In the left lateral decubitus position, a digital exam revealed no abnormalities. There was no protruding hemorrhoid tissue. Subsequently, I performed anoscopy which revealed a redundant right posterior hemorrhoid column.  The decision was made to band the right posterior internal hemorrhoid;  the [place] was used to perform band ligation without complication. Digital anorectal examination was then performed to assure proper positioning of the band; Band found to be in excellent position. No pinching or pain.The patient was discharged home without pain or other issues. Dietary and behavioral recommendations were given. The importance of  bolstered daily fiber intake emphasized.  The patient will return in 3 months for follow-up.   No complications were encountered and the patient tolerated the procedure well.

## 2014-12-15 NOTE — Addendum Note (Signed)
Addended by: Idamae Schuller on: 12/15/2014 12:12 PM   Modules accepted: Level of Service

## 2014-12-16 NOTE — Telephone Encounter (Signed)
Close encounter 

## 2014-12-27 IMAGING — MG MM DIGITAL SCREENING BILAT
4 series · 4 of 4 positions shown · non-contrast
Comparison: Previous exams.

CLINICAL DATA: Screening.

DIGITAL BILATERAL SCREENING MAMMOGRAM WITH CAD

[L CC]
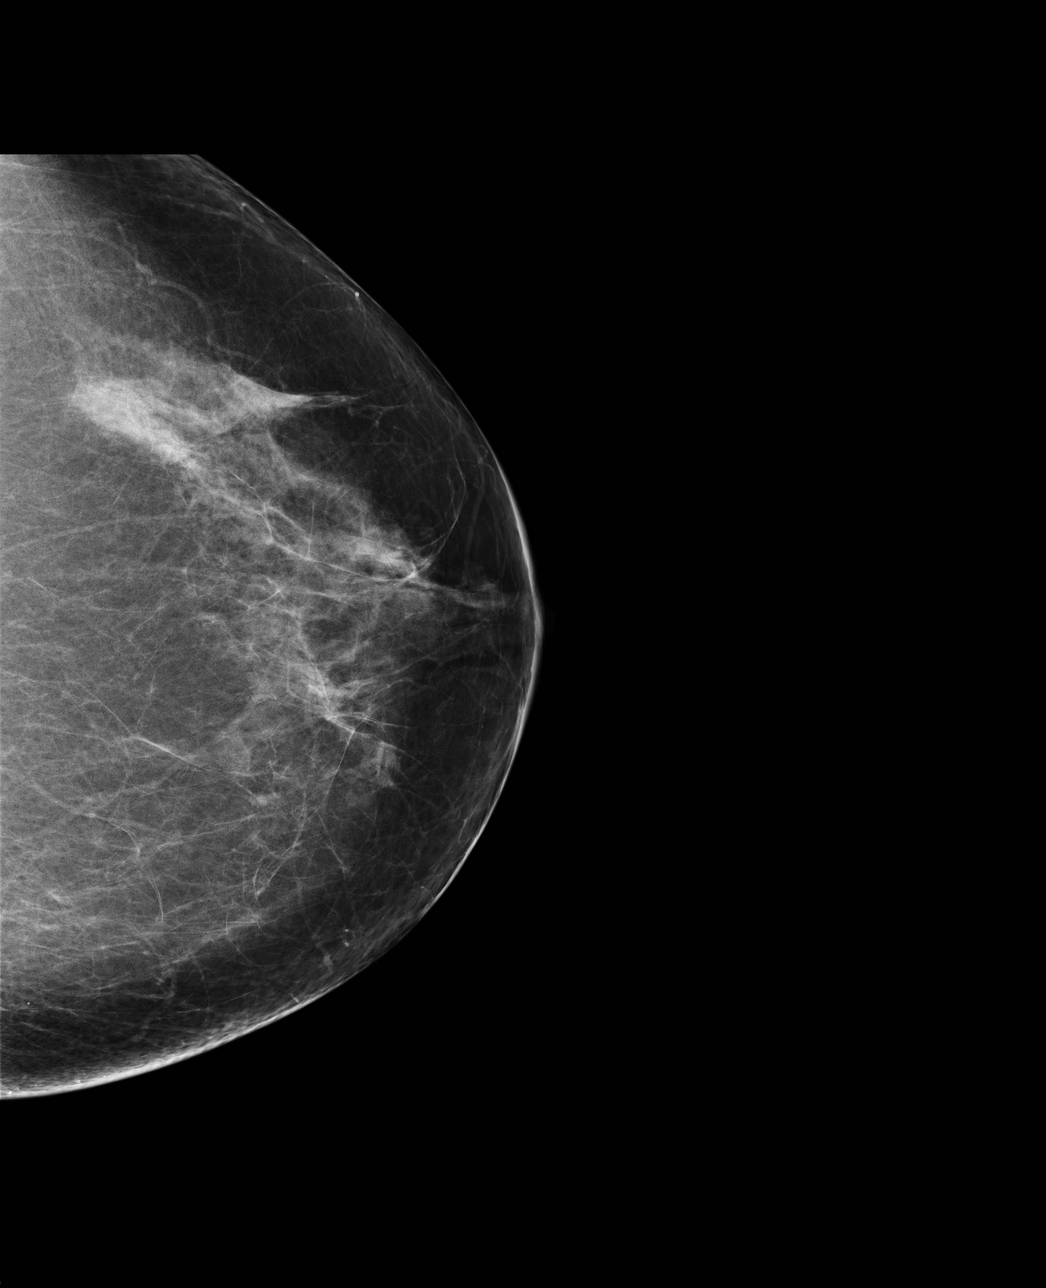

[L MLO]
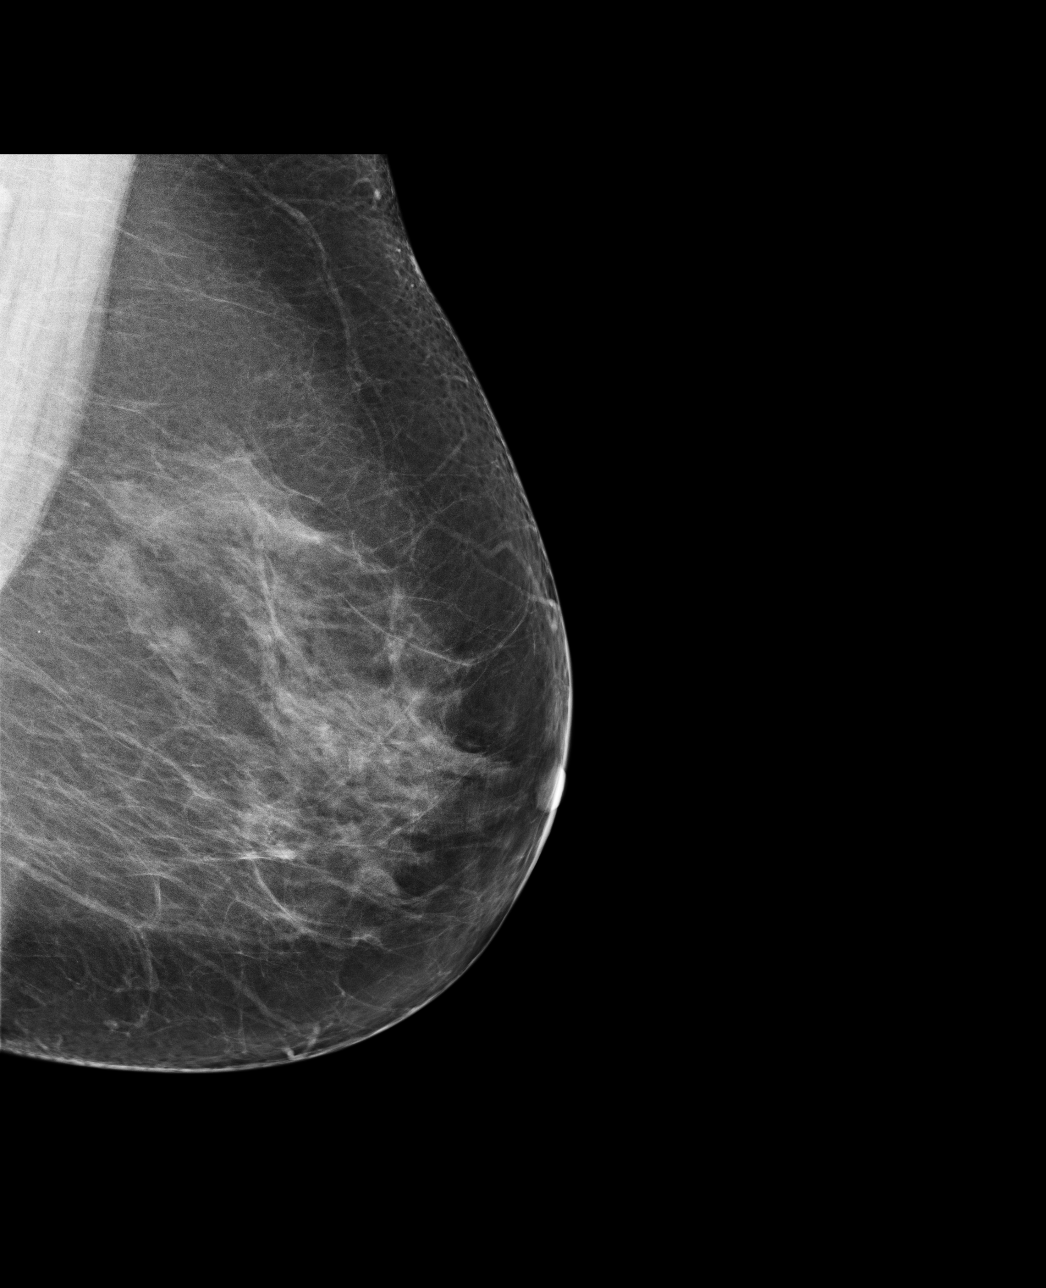

[R CC]
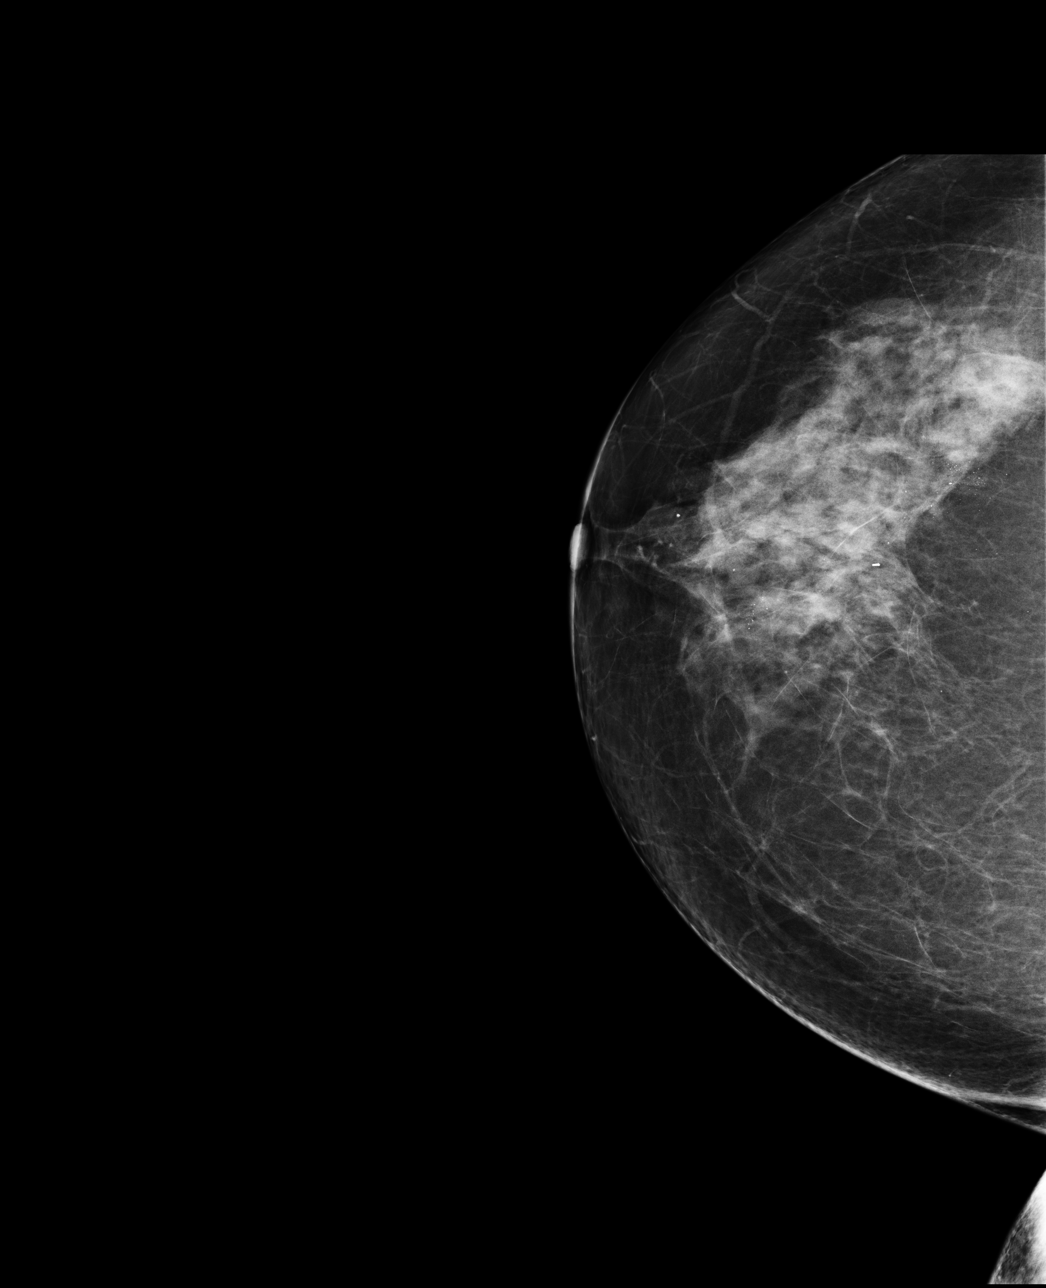

[R MLO]
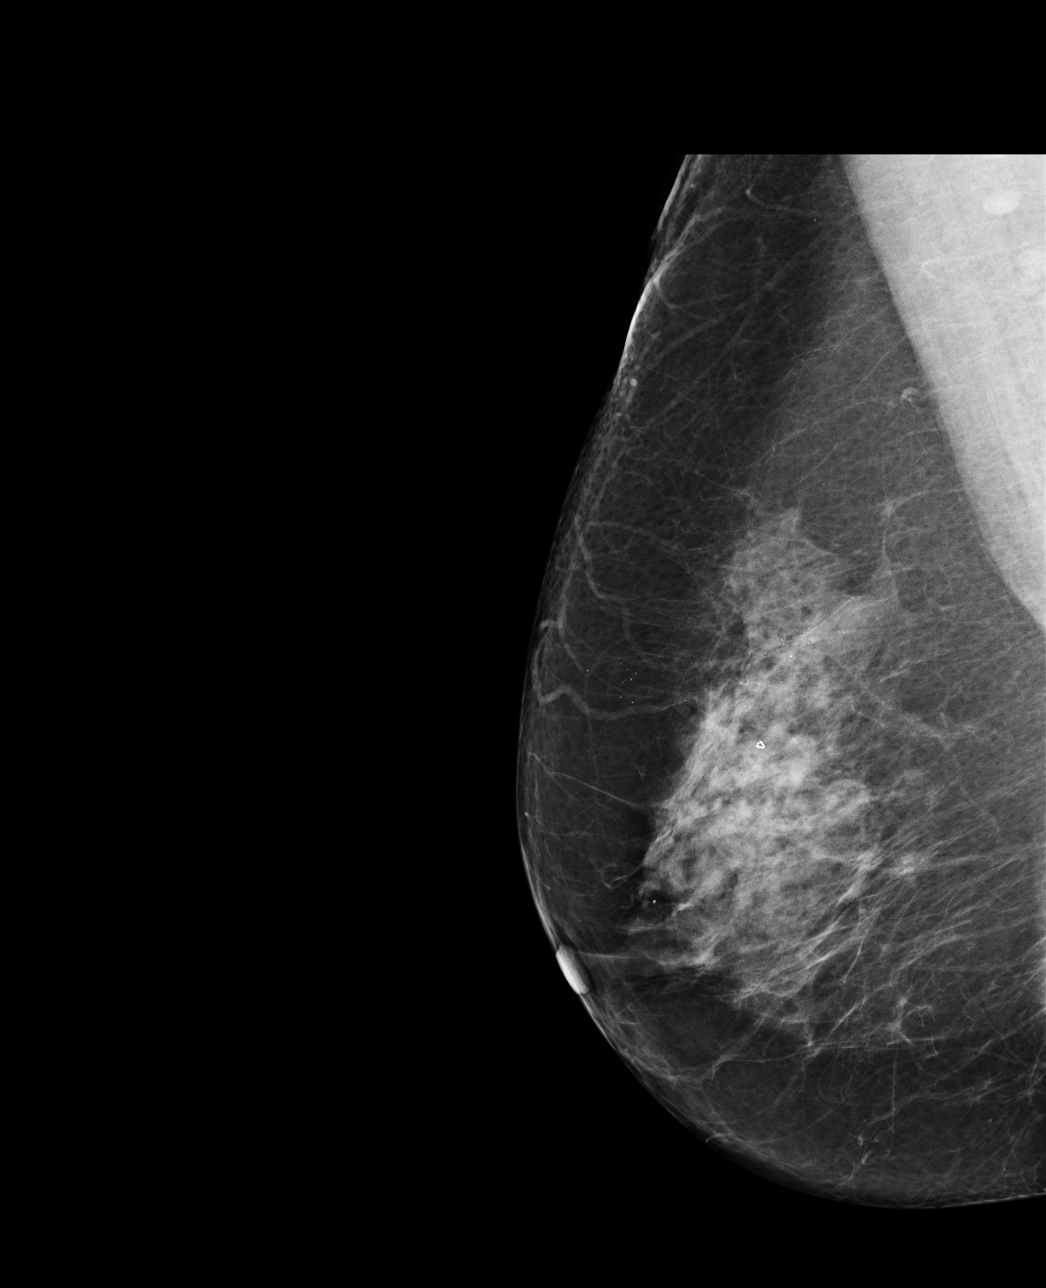

[4 of 4 positions shown; findings below may reference images not displayed]

FINDINGS: ACR Breast Density Category 3: The breast tissue is heterogeneously
dense.

No suspicious masses, architectural distortion, or calcifications
are present.

Images were processed with CAD.
IMPRESSION: No mammographic evidence of malignancy.

A result letter of this screening mammogram will be mailed directly
to the patient.

RECOMMENDATION:
Screening mammogram in one year. (Code:S5-2-VZT)

BI-RADS CATEGORY 1:  Negative.

## 2015-01-14 ENCOUNTER — Ambulatory Visit (HOSPITAL_COMMUNITY): Payer: Medicaid Other

## 2015-01-27 ENCOUNTER — Other Ambulatory Visit: Payer: Self-pay | Admitting: Internal Medicine

## 2015-01-27 ENCOUNTER — Encounter: Payer: Self-pay | Admitting: Internal Medicine

## 2015-01-27 NOTE — Telephone Encounter (Signed)
Rx(s) sent to pharmacy electronically. OV in August 2016

## 2015-02-22 ENCOUNTER — Encounter: Payer: Self-pay | Admitting: Internal Medicine

## 2015-03-02 ENCOUNTER — Encounter: Payer: Self-pay | Admitting: Internal Medicine

## 2015-03-02 ENCOUNTER — Telehealth: Payer: Self-pay | Admitting: Internal Medicine

## 2015-03-04 NOTE — Telephone Encounter (Signed)
Close encounter 

## 2015-03-16 ENCOUNTER — Ambulatory Visit: Payer: Medicaid Other | Admitting: Internal Medicine

## 2015-03-23 ENCOUNTER — Ambulatory Visit (INDEPENDENT_AMBULATORY_CARE_PROVIDER_SITE_OTHER): Payer: Medicaid Other | Admitting: Internal Medicine

## 2015-03-23 ENCOUNTER — Encounter: Payer: Self-pay | Admitting: Internal Medicine

## 2015-03-23 VITALS — BP 152/77 | HR 71 | Temp 98.3°F | Ht 64.0 in | Wt 169.2 lb

## 2015-03-23 DIAGNOSIS — K5900 Constipation, unspecified: Secondary | ICD-10-CM | POA: Diagnosis not present

## 2015-03-23 DIAGNOSIS — K589 Irritable bowel syndrome without diarrhea: Secondary | ICD-10-CM

## 2015-03-23 NOTE — Progress Notes (Signed)
Primary Care Physician:  Glo Herring., MD Primary Gastroenterologist:  Dr. Gala Romney  Pre-Procedure History & Physical: HPI:  Katelyn [name] is a 54 y.o. female here for  followup of hemorrhoids and constipation.  Patient states that looking back after having hemorrhoid banded, she feels this treatment has given her a "new butt hole".  Very pleased. She still occasionally has some protruding tissue in the setting of constipation. Long history of IBS-C.  Feels she's having a difficult time having a bowel movement; having some diffuse abdominal pressure intermittently attempting a BM. Most the time she is constipated in spite of taking 3 teaspoons of Benefiber daily. She does use over-the-counter laxatives. History of colonic adenoma; will be due for surveillance examination 2019. Hasn't passed any blood. She applies Vaseline to her ano-rectum when she has a flare in symptoms on occasion.  Her medication regimen includes Norco and Ditropan.  Past Medical History  Diagnosis Date  . Depression   . Panic attack   . Hypertension   . Hypercholesteremia   . Goiter   . PONV (postoperative nausea and vomiting)   . GERD (gastroesophageal reflux disease)   . Degenerative disc disease, lumbar   . Hepatitis   . Hepatitis C infection 2004    cured with treatment of interferon and ribavirin  . Tubular adenoma 10/14/12  . Hemorrhoids     Past Surgical History  Procedure Laterality Date  . Total shoulder arthroplasty  2011  . Foot surgery Right 2012  . Neck surgery  2005  . Tonsillectomy  1970    and adenoidectomy  . Colonoscopy N/A 10/14/2012    Dr.Dorothea Yow-  normal rectum, 3 polyps in the cecum- the largest being approximately 5 mm in diameter. the remainder of the colonic mucosa appeared normal. bx= tubular adenoma  . Back surgery    . Tubal ligation  1988  . Transthoracic echocardiogram  07/2012    EF 55-60%, mod LVH, mod conc LVH, RVSP increased (mild pulm HTN)  . Cardiopulmonary exercise  test  08/26/2012    RER 0.94, Peak VO2 87%, HR 69% predicted, normal VO2 curves  . Hemorrhoid banding      Prior to Admission medications   Medication Sig Start Date End Date Taking? Authorizing Provider  atorvastatin (LIPITOR) 20 MG tablet Take 20 mg by mouth daily.   Yes Historical Provider, MD  FLUoxetine (PROZAC) 40 MG capsule Take 40 mg by mouth daily.     Yes Historical Provider, MD  furosemide (LASIX) 20 MG tablet TAKE (1) OR (2) TABLETS BY MOUTH DAILY AS DIRECTED. 10/16/14  Yes Pixie Casino, MD  HYDROcodone-acetaminophen (NORCO) 10-325 MG per tablet Take 1 tablet by mouth every 6 (six) hours as needed.   Yes Historical Provider, MD  Inulin (FIBER CHOICE PO) Take by mouth daily.   Yes Historical Provider, MD  lisinopril-hydrochlorothiazide (PRINZIDE,ZESTORETIC) 20-12.5 MG per tablet TAKE 1 TABLET BY MOUTH ONCE DAILY. 01/27/15  Yes Pixie Casino, MD  methimazole (TAPAZOLE) 5 MG tablet Take 5 mg by mouth daily.   Yes Historical Provider, MD  oxybutynin (DITROPAN) 5 MG tablet Take 5 mg by mouth daily.   Yes Historical Provider, MD  potassium chloride (K-DUR,KLOR-CON) 10 MEQ tablet TAKE 1 TABLET BY MOUTH ONCE DAILY FOR POTASSIUM REPLACEMENT 01/27/15  Yes Pixie Casino, MD  potassium chloride SA (K-DUR,KLOR-CON) 20 MEQ tablet Take 20 mEq by mouth daily.   Yes Historical Provider, MD  ARIPiprazole (ABILIFY) 2 MG tablet Take 2 mg by mouth daily.  Historical Provider, MD  Sodium Fluoride (PREVIDENT DT) Place onto teeth daily.    Historical Provider, MD  tolterodine (DETROL) 1 MG tablet Take 1 mg by mouth daily.    Historical Provider, MD    Allergies as of 03/23/2015 - Review Complete 03/23/2015  Allergen Reaction Noted  . Ancef [cefazolin] Swelling 02/06/2013  . Sulfa antibiotics Nausea And Vomiting and Rash 04/26/2011    Family History  Problem Relation Age of Onset  . Colon cancer Neg Hx   . Lung cancer Father   . Hepatitis C Sister     also OA  . Breast cancer Maternal  Grandmother     and lymph cancer  . Kidney cancer Maternal Grandfather   . Scoliosis Mother     also kidney infections  . Osteoarthritis Mother   . Glaucoma Mother   . ADD / ADHD Child   . ADD / ADHD Child   . Bipolar disorder Child     Social History   Social History  . Marital Status: Divorced    Spouse Name: N/A  . Number of Children: 2  . Years of Education: 12   Occupational History  . Designer, industrial/product    Social History Main Topics  . Smoking status: Current Every Day Smoker -- 1.00 packs/day for 35 years    Types: Cigarettes    Last Attempt to Quit: 07/03/2013  . Smokeless tobacco: Never Used  . Alcohol Use: Yes     Comment: RARELY  . Drug Use: No  . Sexual Activity: No   Other Topics Concern  . Not on file   Social History Narrative    Review of Systems: See HPI, otherwise negative ROS  Physical Exam: BP 152/77 mmHg  Pulse 71  Temp(Src) 98.3 F (36.8 C)  Ht 5\' 4"  (1.626 m)  Wt 169 lb 3.2 oz (76.749 kg)  BMI 29.03 kg/m2 General:   Alert,  Well-developed, well-nourished, pleasant and cooperative in NAD Skin:  Intact without significant lesions or rashes. Neck:  Supple; no masses or thyromegaly. No significant cervical adenopathy. Lungs:  Clear throughout to auscultation.   No wheezes, crackles, or rhonchi. No acute distress. Heart:  Regular rate and rhythm; no murmurs, clicks, rubs,  or gallops. Abdomen: Non-distended, normal bowel sounds.  Soft and nontender without appreciable mass or hepatosplenomegaly.   Impression:  Overall, marked improvement in hemorrhoidal symptoms with banding. Patient has background symptoms of IBS-C. Currently having a flare. Medications also likely playing a role in constipation. History of colonic adenoma-surveillance colonoscopy 2019  Recommendations:  Information on IBS-constipation  Begin Linzess 145 daily (side effects discussed)  Telephone progress report 2 weeks  Office visit in 6 weeks    Notice: This  dictation was prepared with Dragon dictation along with smaller phrase technology. Any transcriptional errors that result from this process are unintentional and may not be corrected upon review.

## 2015-03-23 NOTE — Patient Instructions (Signed)
Information on IBS-constipation  Begin Linzess 145 daily (side effects discussed)  Telephone progress report 2 weeks  Office visit in 6 weeks

## 2015-04-08 ENCOUNTER — Telehealth: Payer: Self-pay

## 2015-04-08 MED ORDER — LINACLOTIDE 290 MCG PO CAPS: 290.0000 ug | ORAL_CAPSULE | Freq: Every day | ORAL | Status: DC

## 2015-04-08 NOTE — Telephone Encounter (Signed)
rx sent to the pharmacy. 

## 2015-04-08 NOTE — Telephone Encounter (Signed)
Pt called with progress report- she said the linzess 145 was working but she felt like she needed an increase in the dosage and wanted to know if she could have an rx for linzess 290?  Pt uses [facility].

## 2015-04-08 NOTE — Telephone Encounter (Signed)
Yes dose can be increased to 290 g daily

## 2015-04-18 IMAGING — CT CT L SPINE W/ CM
4 of 8 series · 12 of 33 positions shown, 14 images · non-contrast
Comparison: none

CLINICAL DATA: Bilateral lower extremity radicular pain, greater on
the right than left.  MRI demonstrates multilevel lumbar disc
degeneration, most pronounced at L4-L5 and L5-S1.  Low back pain
with bilateral leg pain.
TECHNIQUE: Contiguous axial images were obtained from the inferior
aspect of through . Coronal and sagittal reconstructions of the
disc spaces were created.

[Series 2: l spine bone · axial · 0.27mm/px · z∈[-219,-147]mm · 3 of 59 slices shown, 4 images]
[im 15/59  soft-tissue]
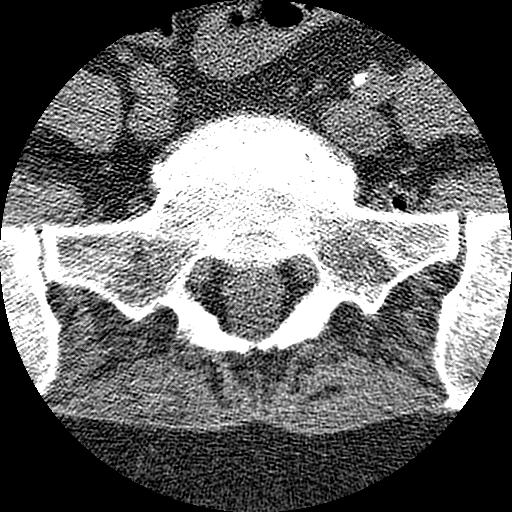
[im 15/59  bone]
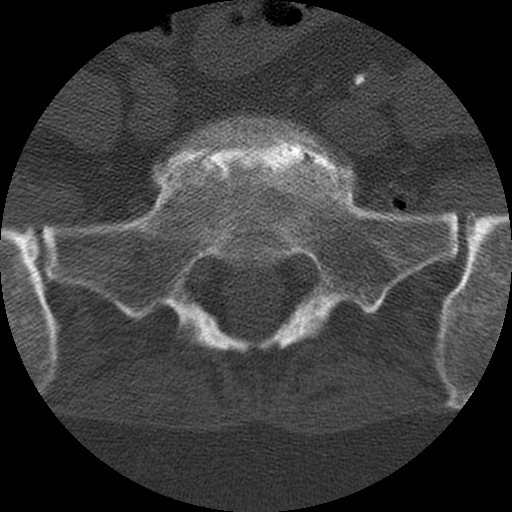
[im 30/59  bone]
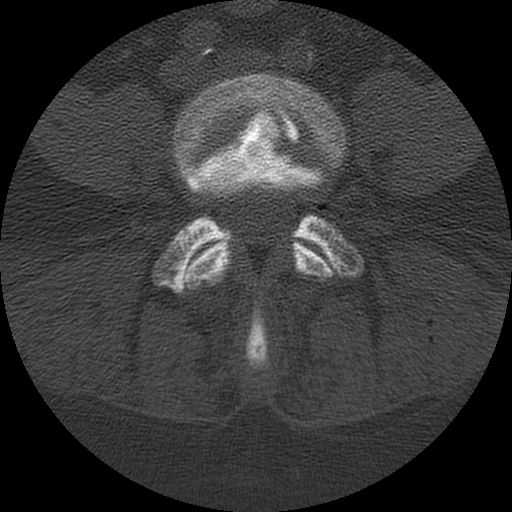
[im 44/59  bone]
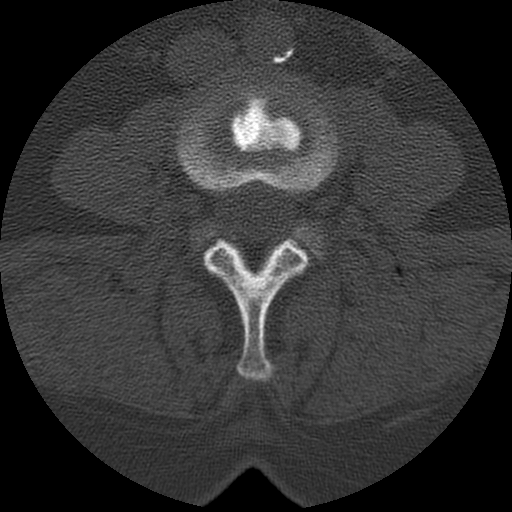

[Series 3: l spine soft · axial · 0.27mm/px · z∈[-219,-147]mm · 3 of 59 slices shown]
[im 15/59  soft-tissue]
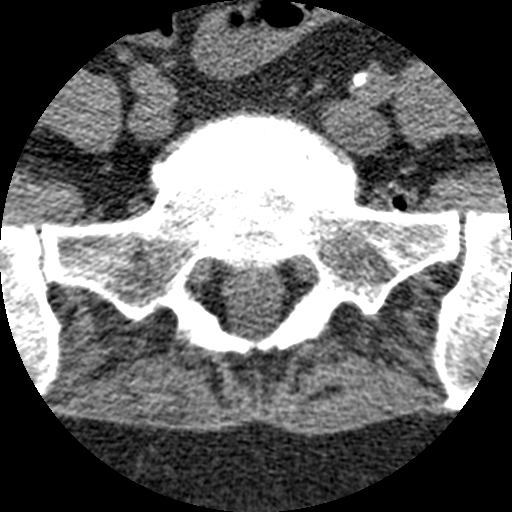
[im 30/59  soft-tissue]
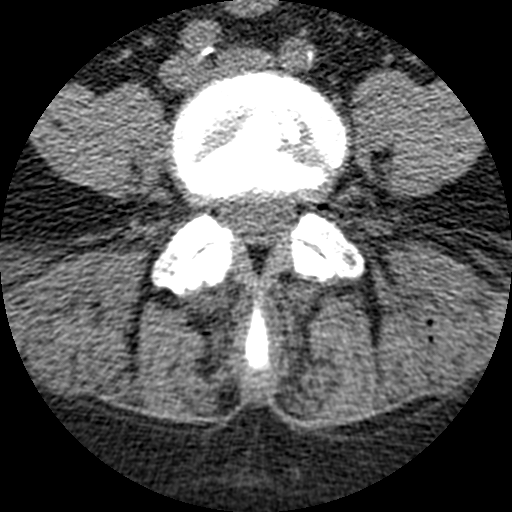
[im 44/59  soft-tissue]
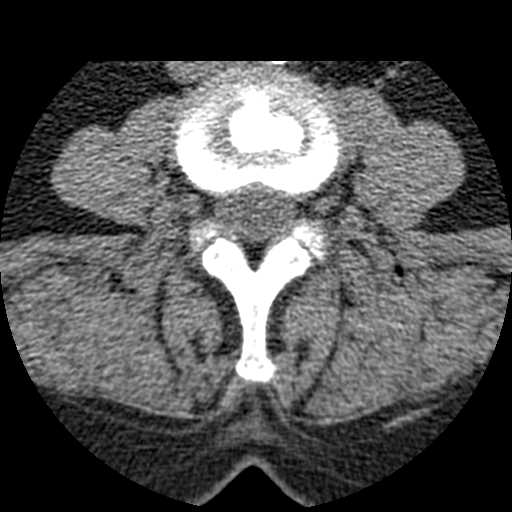

[Series 401: cor · coronal · 0.29mm/px · 1 of 52 slices shown]
[im 26/52  bone]
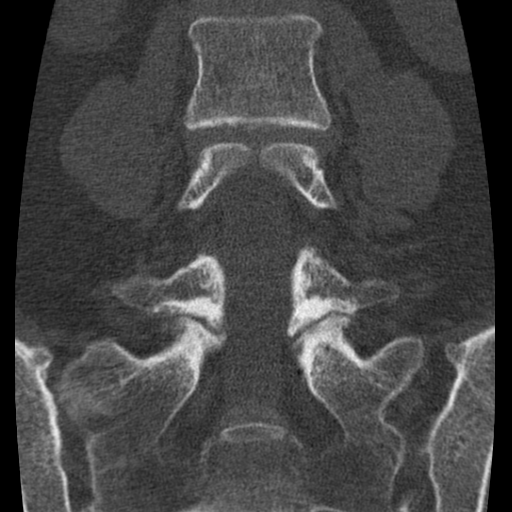

[Series 402: sag · sagittal · 0.29mm/px · 5 of 52 slices shown, 6 images]
[im 18/52  bone]
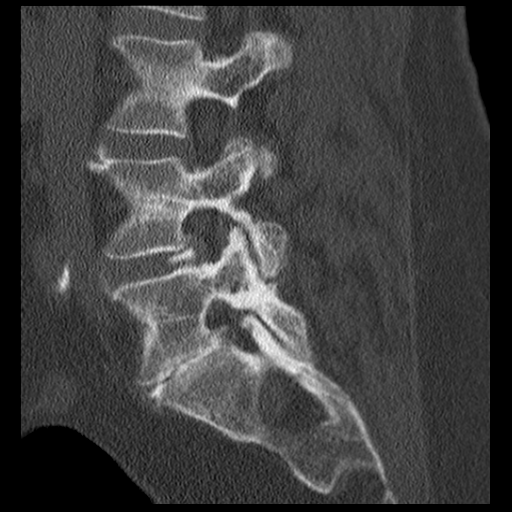
[im 22/52  bone]
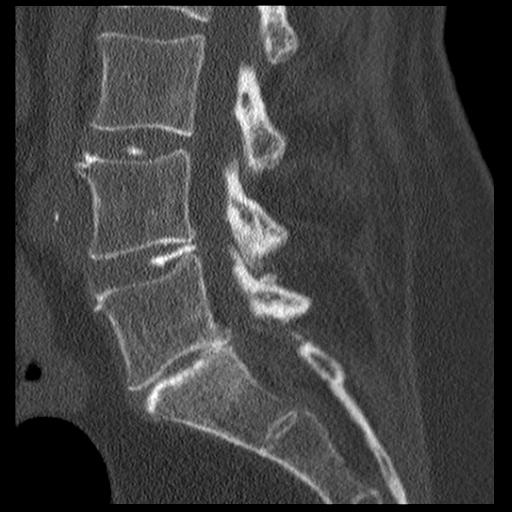
[im 26/52  soft-tissue]
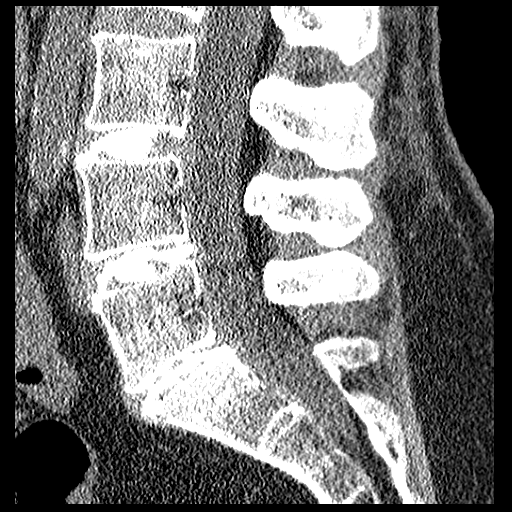
[im 26/52  bone]
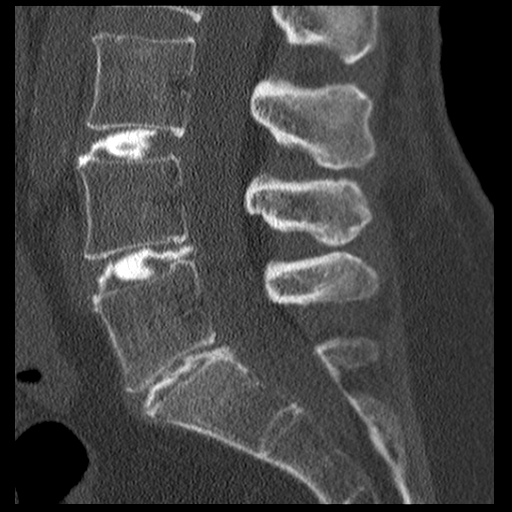
[im 30/52  bone]
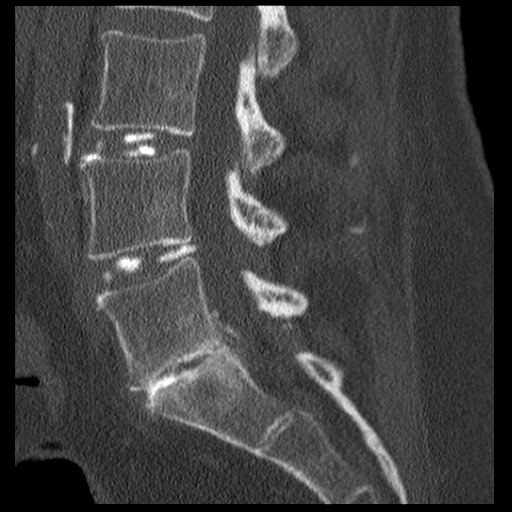
[im 35/52  bone]
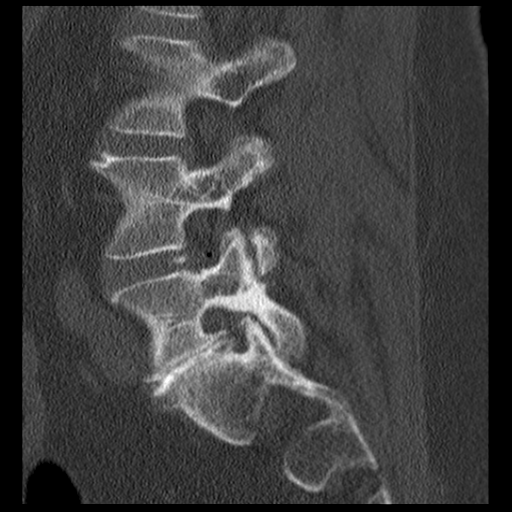

[12 of 33 positions shown; findings below may reference images not displayed]

LUMBAR DISCOGRAM L3-L4, L4-L5 AND L5-S1

Procedure: After a thorough discussion of risks and benefits of the
procedure, written and oral informed consent was obtained. Specific
risks included discitis, infection of soft tissue and/or bone, and
nontarget injection.  Accelerated progression of degenerative disk
disease was also discussed. General risks of the procedure
including bleeding, infection, and injury to nerves, blood vessels,
and adjacent structures. Verbal consent was obtained by Dr. Sova
prior to the procedure.
Discography was performed via a left posterior oblique approach.
Prior to prep and draped in the usual sterile fashion, the entry
sites were marked using fluoroscopy. Subsequently, using stringent
sterile technique, the back was prepped and draped. 2 gram IV
Cephazolin as an IV antibiotic was completed 30 minutes prior to
needle stick. 1 mL Antibiotic was also mixed with Jmnipaque-FUX
used for disc injection.

All elements of maximum barrier sterile technique were employed
(cap, mask, sterile gown and gloves, large sterile sheet, hand
hygiene, betadine scrub or alternative skin antisepsis).  After
local anesthesia was provided with 1% lidocaine without epinephrine
to the skin and deeper tissues spinal needles were inserted. Under
meticulous sterile technique, 15, 15, 20 cm 22-gauge needles were
advanced fluoroscopically into the disc spaces of L3-L4 and L4-L5
using intermittent fluoroscopy. One satisfactory needle position
was achieved, contrast was injected.

Fluoroscopy time: 5 minutes 19 seconds

L3-L4: Total volume injected: 1.5 ml. Opening pressure was 5 PSI.
Pressure endpoint 25 PSI. Pain reported [DATE]  (1 - 10
scale). Sensation described as low back pain.   When questioned
specifically about the nature of the back pain, the patient applied
that this back pain was mild and similar to the low backache after
standing for extended period of time. Pattern of opacification
showed  normal nuclear opacification without epidural
extravasation.  There was an anterior annular tear with contrast
extending towards the anterior inferior corner of the disc.
Representative images obtained in AP and lateral projections.
L4-L5: Total volume injected: 1 ml.  Opening pressure was 15  PSI.
Pressure endpoint 45.  PSI. Pain reported [DATE]  (1 - 10
scale). Sensation described as low back pain and right leg pain.
Concordant with every day back pain.. Pattern of opacification
showed  diffuse annular degeneration.  Posterior spread nearly to
the epidural space compatible with grade 4 tear.. Representative
images obtained in AP and lateral projections.
L5-S1: Total volume injected: 1 ml. Opening pressure was 0 PSI.
Pressure endpoint 40 PSI. Pain reported [DATE]  (1 - 10
scale). Sensation described as low back pain without lower
extremity radicular pain. Pattern of opacification showed  diffuse
annular degeneration without definite evidence of epidural spread
on plain films.. Representative images obtained in AP and lateral
projections.

Sedation was administered with 1.0 mg of intravenous Versed, 100
mcg Fentanyl IV. 30 mg of IV Toradol was given.  4 mg of
intramuscular Zofran was administered.
IMPRESSION: 1.  Technically successful three-level discogram at L3-L4, L4-L5
and L5-S1.
2.  Poorly concordant low back aching and pain at L3-L4 relative to
L4-L5 and L5-S1.
3.  L4-L5 provocative testing was most concordant, with radicular
symptoms and matching low back pain.
4.  L5-S1 provocative testing was partially concordant low back
pain without radicular symptoms.

CT POST-DISCOGRAM
FINDINGS: No destructive osseous lesions.  Vertebral body height
preserved.  Field of scan extended from the inferior aspect of L2-
S3.  The paraspinal soft tissues show atherosclerosis.  Mild SI
joint degenerative disease.

L3-L4: There is a grade 5 anterior annular tearing in the midline,
with contrast tracking along the anterior L4 vertebra.
Posteriorly, minimal disc bulging is present without stenosis.
There is no loss of disc height.  Small marginal osteophytes.
L4-L5: Mild loss of disc height is present.  Grade 4 annular
tearing is present on the right with contrast extending into a
right foraminal disc extrusion that shows cranial migration in the
foramen contacting the right L4 nerve (image number [id number] series [id number]).
No epidural spread of contrast.  Mild facet degeneration is
present.  There is no central or lateral recess stenosis
identified.
L5-S1: Disc degeneration and loss of height.  Diffuse
circumferential annular tearing with left posterolateral grade 5
annular tearing with a tiny amount of epidural contrast extending
cranially.  Endplate osteophytes project into both neural foramina
producing mild to moderate symmetric bilateral foraminal stenosis.
Facets show mild degeneration.  The central canal appears patent.
There is encroachment on the left lateral recess body annular
bulging and endplate osteophytes.
IMPRESSION: 1.  L3-L4 small anterior grade 5 annular tear.  Relatively
preserved disc height.  No stenosis.
2.  L4-L5 grade 4 right posterolateral annular tear with disc
extrusion into the right neural foramen potentially affecting the
exiting right L4 nerve.  This level demonstrated concordant back
pain with right lower extremity radiculopathy, probably secondary
to this extrusion in the foramen.
3.  L5-S1 severe disc degeneration with diffuse annular tearing.
Left posterolateral grade 5 annular tear.  Mild to moderate
bilateral symmetric foraminal stenosis associated with degenerated
disc and endplate osteophytes.  Encroachment on the descending left
S1 nerve root approaching the lateral recess.

## 2015-04-22 ENCOUNTER — Ambulatory Visit: Payer: Medicaid Other | Admitting: Internal Medicine

## 2015-05-07 ENCOUNTER — Telehealth: Payer: Self-pay | Admitting: Internal Medicine

## 2015-05-07 ENCOUNTER — Encounter: Payer: Self-pay | Admitting: Internal Medicine

## 2015-05-07 ENCOUNTER — Ambulatory Visit: Payer: Medicaid Other | Admitting: Internal Medicine

## 2015-05-07 NOTE — Telephone Encounter (Signed)
noted 

## 2015-05-07 NOTE — Telephone Encounter (Signed)
PATIENT WAS A NO SHOW AND LETTER SENT  °

## 2015-06-11 ENCOUNTER — Encounter: Payer: Self-pay | Admitting: Internal Medicine

## 2015-06-11 ENCOUNTER — Ambulatory Visit (INDEPENDENT_AMBULATORY_CARE_PROVIDER_SITE_OTHER): Payer: Medicaid Other | Admitting: Internal Medicine

## 2015-06-11 VITALS — BP 119/71 | HR 75 | Temp 97.6°F | Ht 65.0 in | Wt 174.0 lb

## 2015-06-11 DIAGNOSIS — K5900 Constipation, unspecified: Secondary | ICD-10-CM | POA: Diagnosis not present

## 2015-06-11 DIAGNOSIS — K649 Unspecified hemorrhoids: Secondary | ICD-10-CM | POA: Diagnosis not present

## 2015-06-11 DIAGNOSIS — Z86010 Personal history of colonic polyps: Secondary | ICD-10-CM

## 2015-06-11 DIAGNOSIS — K219 Gastro-esophageal reflux disease without esophagitis: Secondary | ICD-10-CM | POA: Diagnosis not present

## 2015-06-11 NOTE — Patient Instructions (Signed)
Increase Benefiber to 2 tablespoons twice daily  Continue linzess 290 daily  Add miralax 17 grams at bedtime  Avoid straining.  Limit toilet time to 2-5 minutes  Call with any interim problems  Schedule followup appointment in 3 months from now  GERD information  Colonoscopy 2019

## 2015-06-11 NOTE — Progress Notes (Signed)
Primary Care Physician:  Glo Herring., MD Primary Gastroenterologist:  Dr. Gala Romney  Pre-Procedure History & Physical: HPI:  Katelyn [name] is a 54 y.o. female here for followup of a symptomatic hemorrhoids and constipation. Anorectal pain has resolved since hemorrhoid banding. Now on Linzess 290 daily.   Benefiber 1-2 teaspoons twice daily. Describes [place] stools 5-6. Tries not to dwell on the toilet. She does feel some protrusion from time to time. Has not tried MiraLax. GERD symptoms well controlled on diet. No dysphagia symptoms. History of colonic adenoma; due for surveillance 2019.  Past Medical History  Diagnosis Date  . Depression   . Panic attack   . Hypertension   . Hypercholesteremia   . Goiter   . PONV (postoperative nausea and vomiting)   . GERD (gastroesophageal reflux disease)   . Degenerative disc disease, lumbar   . Hepatitis   . Hepatitis C infection 2004    cured with treatment of interferon and ribavirin  . Tubular adenoma 10/14/12  . Hemorrhoids     Past Surgical History  Procedure Laterality Date  . Total shoulder arthroplasty  2011  . Foot surgery Right 2012  . Neck surgery  2005  . Tonsillectomy  1970    and adenoidectomy  . Colonoscopy N/A 10/14/2012    Dr.Rourk-  normal rectum, 3 polyps in the cecum- the largest being approximately 5 mm in diameter. the remainder of the colonic mucosa appeared normal. bx= tubular adenoma  . Back surgery    . Tubal ligation  1988  . Transthoracic echocardiogram  07/2012    EF 55-60%, mod LVH, mod conc LVH, RVSP increased (mild pulm HTN)  . Cardiopulmonary exercise test  08/26/2012    RER 0.94, Peak VO2 87%, HR 69% predicted, normal VO2 curves  . Hemorrhoid banding      Prior to Admission medications   Medication Sig Start Date End Date Taking? Authorizing Provider  ARIPiprazole (ABILIFY) 2 MG tablet Take 2 mg by mouth daily.   Yes Historical Provider, MD  atorvastatin (LIPITOR) 20 MG tablet Take 20 mg by  mouth daily.   Yes Historical Provider, MD  FLUoxetine (PROZAC) 40 MG capsule Take 40 mg by mouth daily.     Yes Historical Provider, MD  furosemide (LASIX) 20 MG tablet TAKE (1) OR (2) TABLETS BY MOUTH DAILY AS DIRECTED. 10/16/14  Yes Pixie Casino, MD  HYDROcodone-acetaminophen (NORCO) 10-325 MG per tablet Take 1 tablet by mouth every 6 (six) hours as needed.   Yes Historical Provider, MD  Inulin (FIBER CHOICE PO) Take by mouth daily.   Yes Historical Provider, MD  Linaclotide (LINZESS) 290 MCG CAPS capsule Take 1 capsule (290 mcg total) by mouth daily. 04/08/15  Yes Daneil Dolin, MD  lisinopril-hydrochlorothiazide (PRINZIDE,ZESTORETIC) 20-12.5 MG per tablet TAKE 1 TABLET BY MOUTH ONCE DAILY. 01/27/15  Yes Pixie Casino, MD  methimazole (TAPAZOLE) 5 MG tablet Take 5 mg by mouth daily.   Yes Historical Provider, MD  oxybutynin (DITROPAN) 5 MG tablet Take 5 mg by mouth daily.   Yes Historical Provider, MD  potassium chloride (K-DUR,KLOR-CON) 10 MEQ tablet TAKE 1 TABLET BY MOUTH ONCE DAILY FOR POTASSIUM REPLACEMENT 01/27/15  Yes Pixie Casino, MD  potassium chloride SA (K-DUR,KLOR-CON) 20 MEQ tablet Take 20 mEq by mouth daily.   Yes Historical Provider, MD  Sodium Fluoride (PREVIDENT DT) Place onto teeth daily.   Yes Historical Provider, MD  tolterodine (DETROL) 1 MG tablet Take 1 mg by mouth daily.  Yes Historical Provider, MD    Allergies as of 06/11/2015 - Review Complete 06/11/2015  Allergen Reaction Noted  . Ancef [cefazolin] Swelling 02/06/2013  . Sulfa antibiotics Nausea And Vomiting and Rash 04/26/2011    Family History  Problem Relation Age of Onset  . Colon cancer Neg Hx   . Lung cancer Father   . Hepatitis C Sister     also OA  . Breast cancer Maternal Grandmother     and lymph cancer  . Kidney cancer Maternal Grandfather   . Scoliosis Mother     also kidney infections  . Osteoarthritis Mother   . Glaucoma Mother   . ADD / ADHD Child   . ADD / ADHD Child   . Bipolar  disorder Child     Social History   Social History  . Marital Status: Divorced    Spouse Name: N/A  . Number of Children: 2  . Years of Education: 12   Occupational History  . Designer, industrial/product    Social History Main Topics  . Smoking status: Current Every Day Smoker -- 1.00 packs/day for 35 years    Types: Cigarettes    Last Attempt to Quit: 07/03/2013  . Smokeless tobacco: Never Used  . Alcohol Use: Yes     Comment: RARELY  . Drug Use: No  . Sexual Activity: No   Other Topics Concern  . Not on file   Social History Narrative    Review of Systems: See HPI, otherwise negative ROS  Physical Exam: BP 119/71 mmHg  Pulse 75  Temp(Src) 97.6 F (36.4 C) (Oral)  Ht 5\' 5"  (1.651 m)  Wt 174 lb (78.926 kg)  BMI 28.96 kg/m2 General:   Alert,  Well-developed, well-nourished, pleasant and cooperative in NAD Skin:  Intact without significant lesions or rashes.     Impression:   Pleasant 54 year old lady status post hemorrhoid banding with much improvement in hemorrhoid symptoms. Chronic constipation continues to be a challenge. GERD symptoms well controlled on diet alone. Constipation likely in part related to chronic polypharmacy including narcotics. She needs further adjustment in her constipation regimen. History of colonic enemas-due for surveillance examination 2019.  Recommendations:    Increase Benefiber to 2 tablespoons twice daily  Continue linzess 290 daily  Add miralax 17 grams at bedtime  Avoid straining.  Limit toilet time to 2-5 minutes  Call with any interim problems  Schedule followup appointment in 3 months from now  GERD information  Colonoscopy 2019      Notice: This dictation was prepared with Dragon dictation along with smaller phrase technology. Any transcriptional errors that result from this process are unintentional and may not be corrected upon review.

## 2015-07-20 ENCOUNTER — Other Ambulatory Visit: Payer: Self-pay | Admitting: "Endocrinology

## 2015-07-28 DIAGNOSIS — Z6827 Body mass index (BMI) 27.0-27.9, adult: Secondary | ICD-10-CM | POA: Diagnosis not present

## 2015-07-28 DIAGNOSIS — M94269 Chondromalacia, unspecified knee: Secondary | ICD-10-CM | POA: Diagnosis not present

## 2015-07-28 DIAGNOSIS — L84 Corns and callosities: Secondary | ICD-10-CM | POA: Diagnosis not present

## 2015-07-28 DIAGNOSIS — E663 Overweight: Secondary | ICD-10-CM | POA: Diagnosis not present

## 2015-07-28 DIAGNOSIS — Z1389 Encounter for screening for other disorder: Secondary | ICD-10-CM | POA: Diagnosis not present

## 2015-08-10 DIAGNOSIS — G890 Central pain syndrome: Secondary | ICD-10-CM | POA: Diagnosis not present

## 2015-08-10 DIAGNOSIS — G541 Lumbosacral plexus disorders: Secondary | ICD-10-CM | POA: Diagnosis not present

## 2015-08-10 DIAGNOSIS — G894 Chronic pain syndrome: Secondary | ICD-10-CM | POA: Diagnosis not present

## 2015-08-10 DIAGNOSIS — G603 Idiopathic progressive neuropathy: Secondary | ICD-10-CM | POA: Diagnosis not present

## 2015-08-12 DIAGNOSIS — M25561 Pain in right knee: Secondary | ICD-10-CM | POA: Diagnosis not present

## 2015-08-12 DIAGNOSIS — M25562 Pain in left knee: Secondary | ICD-10-CM | POA: Diagnosis not present

## 2015-08-17 ENCOUNTER — Encounter: Payer: Self-pay | Admitting: Internal Medicine

## 2015-08-21 DIAGNOSIS — M25561 Pain in right knee: Secondary | ICD-10-CM | POA: Diagnosis not present

## 2015-08-21 DIAGNOSIS — M25562 Pain in left knee: Secondary | ICD-10-CM | POA: Diagnosis not present

## 2015-08-23 ENCOUNTER — Other Ambulatory Visit: Payer: Self-pay | Admitting: Internal Medicine

## 2015-08-31 DIAGNOSIS — M25561 Pain in right knee: Secondary | ICD-10-CM | POA: Diagnosis not present

## 2015-08-31 DIAGNOSIS — M19011 Primary osteoarthritis, right shoulder: Secondary | ICD-10-CM | POA: Diagnosis not present

## 2015-08-31 DIAGNOSIS — M25562 Pain in left knee: Secondary | ICD-10-CM | POA: Diagnosis not present

## 2015-08-31 DIAGNOSIS — M19012 Primary osteoarthritis, left shoulder: Secondary | ICD-10-CM | POA: Diagnosis not present

## 2015-09-07 DIAGNOSIS — M79652 Pain in left thigh: Secondary | ICD-10-CM | POA: Diagnosis not present

## 2015-09-07 DIAGNOSIS — M542 Cervicalgia: Secondary | ICD-10-CM | POA: Diagnosis not present

## 2015-09-07 DIAGNOSIS — M25562 Pain in left knee: Secondary | ICD-10-CM | POA: Diagnosis not present

## 2015-09-07 DIAGNOSIS — Z79891 Long term (current) use of opiate analgesic: Secondary | ICD-10-CM | POA: Diagnosis not present

## 2015-09-07 DIAGNOSIS — M545 Low back pain: Secondary | ICD-10-CM | POA: Diagnosis not present

## 2015-09-07 DIAGNOSIS — G894 Chronic pain syndrome: Secondary | ICD-10-CM | POA: Diagnosis not present

## 2015-09-14 ENCOUNTER — Encounter: Payer: Self-pay | Admitting: Internal Medicine

## 2015-09-14 ENCOUNTER — Ambulatory Visit (INDEPENDENT_AMBULATORY_CARE_PROVIDER_SITE_OTHER): Payer: Medicare Other | Admitting: Internal Medicine

## 2015-09-14 ENCOUNTER — Encounter: Payer: Self-pay | Admitting: Advanced Practice Midwife

## 2015-09-14 ENCOUNTER — Ambulatory Visit (INDEPENDENT_AMBULATORY_CARE_PROVIDER_SITE_OTHER): Payer: Medicaid Other | Admitting: Advanced Practice Midwife

## 2015-09-14 ENCOUNTER — Other Ambulatory Visit: Payer: Self-pay | Admitting: Internal Medicine

## 2015-09-14 VITALS — BP 159/70 | HR 64 | Temp 97.2°F | Ht 64.0 in | Wt 171.4 lb

## 2015-09-14 VITALS — BP 170/80 | HR 62 | Ht 63.25 in | Wt 171.0 lb

## 2015-09-14 DIAGNOSIS — Z0000 Encounter for general adult medical examination without abnormal findings: Secondary | ICD-10-CM

## 2015-09-14 DIAGNOSIS — Z86010 Personal history of colonic polyps: Secondary | ICD-10-CM

## 2015-09-14 DIAGNOSIS — K5900 Constipation, unspecified: Secondary | ICD-10-CM

## 2015-09-14 DIAGNOSIS — Z1211 Encounter for screening for malignant neoplasm of colon: Secondary | ICD-10-CM

## 2015-09-14 DIAGNOSIS — Z860101 Personal history of adenomatous and serrated colon polyps: Secondary | ICD-10-CM

## 2015-09-14 DIAGNOSIS — Z01419 Encounter for gynecological examination (general) (routine) without abnormal findings: Secondary | ICD-10-CM | POA: Diagnosis not present

## 2015-09-14 DIAGNOSIS — K643 Fourth degree hemorrhoids: Secondary | ICD-10-CM | POA: Diagnosis not present

## 2015-09-14 NOTE — Patient Instructions (Addendum)
Continue Linzess, Miralax and Benefiber as directed  Avoid straining.  Limit toilet time to 2-5 minutes  Call with any interim problems  Office visit in 1 year

## 2015-09-14 NOTE — Progress Notes (Signed)
Primary Care Physician:  Glo Herring., MD Primary Gastroenterologist:  Dr. Gala Romney  Pre-Procedure History & Physical: HPI:  Katelyn [name] is a 55 y.o. female here for follow-up of symptomatic hemorrhoids, constipation.  Status post hemorrhoid banding x3. Still notes protruding hemorrhoids.  However, she's been to have a bowel movement much better now with Linzess 290, MiraLax and Benefiber. No bleeding.  Patient manages to have a bowel movement daily to every other day. No bowel pain;  no upper GI tract symptoms. History of colonic adenoma; due for surveillance examination spring 2019.  Past Medical History  Diagnosis Date  . Depression   . Panic attack   . Hypertension   . Hypercholesteremia   . Goiter   . PONV (postoperative nausea and vomiting)   . GERD (gastroesophageal reflux disease)   . Degenerative disc disease, lumbar   . Hepatitis   . Hepatitis C infection 2004    cured with treatment of interferon and ribavirin  . Tubular adenoma 10/14/12  . Hemorrhoids     Past Surgical History  Procedure Laterality Date  . Total shoulder arthroplasty  2011  . Foot surgery Right 2012  . Neck surgery  2005  . Tonsillectomy  1970    and adenoidectomy  . Colonoscopy N/A 10/14/2012    Dr.Amee Boothe-  normal rectum, 3 polyps in the cecum- the largest being approximately 5 mm in diameter. the remainder of the colonic mucosa appeared normal. bx= tubular adenoma  . Back surgery    . Tubal ligation  1988  . Transthoracic echocardiogram  07/2012    EF 55-60%, mod LVH, mod conc LVH, RVSP increased (mild pulm HTN)  . Cardiopulmonary exercise test  08/26/2012    RER 0.94, Peak VO2 87%, HR 69% predicted, normal VO2 curves  . Hemorrhoid banding      Prior to Admission medications   Medication Sig Start Date End Date Taking? Authorizing Provider  ARIPiprazole (ABILIFY) 2 MG tablet Take 2 mg by mouth daily.    Historical Provider, MD  atorvastatin (LIPITOR) 20 MG tablet Take 20 mg by mouth  daily.    Historical Provider, MD  FLUoxetine (PROZAC) 40 MG capsule Take 40 mg by mouth daily.      Historical Provider, MD  furosemide (LASIX) 20 MG tablet TAKE (1) OR (2) TABLETS BY MOUTH DAILY AS DIRECTED. 10/16/14   Pixie Casino, MD  HYDROcodone-acetaminophen (NORCO) 10-325 MG per tablet Take 1 tablet by mouth every 6 (six) hours as needed.    Historical Provider, MD  Inulin (FIBER CHOICE PO) Take by mouth daily.    Historical Provider, MD  Linaclotide Rolan Lipa) 290 MCG CAPS capsule Take 1 capsule (290 mcg total) by mouth daily. 04/08/15   Daneil Dolin, MD  lisinopril-hydrochlorothiazide (PRINZIDE,ZESTORETIC) 20-12.5 MG per tablet TAKE 1 TABLET BY MOUTH ONCE DAILY. 01/27/15   Pixie Casino, MD  methimazole (TAPAZOLE) 5 MG tablet Take 5 mg by mouth daily.    Historical Provider, MD  oxybutynin (DITROPAN) 5 MG tablet Take 5 mg by mouth daily.    Historical Provider, MD  potassium chloride (K-DUR,KLOR-CON) 10 MEQ tablet TAKE 1 TABLET BY MOUTH ONCE DAILY FOR POTASSIUM REPLACEMENT 01/27/15   Pixie Casino, MD  potassium chloride SA (K-DUR,KLOR-CON) 20 MEQ tablet Take 20 mEq by mouth daily.    Historical Provider, MD  Sodium Fluoride (PREVIDENT DT) Place onto teeth daily.    Historical Provider, MD  tolterodine (DETROL) 1 MG tablet Take 1 mg by mouth daily.  Historical Provider, MD    Allergies as of 09/14/2015 - Review Complete 06/11/2015  Allergen Reaction Noted  . Ancef [cefazolin] Swelling 02/06/2013  . Sulfa antibiotics Nausea And Vomiting and Rash 04/26/2011    Family History  Problem Relation Age of Onset  . Colon cancer Neg Hx   . Lung cancer Father   . Hepatitis C Sister     also OA  . Breast cancer Maternal Grandmother     and lymph cancer  . Kidney cancer Maternal Grandfather   . Scoliosis Mother     also kidney infections  . Osteoarthritis Mother   . Glaucoma Mother   . ADD / ADHD Child   . ADD / ADHD Child   . Bipolar disorder Child     Social History    Social History  . Marital Status: Divorced    Spouse Name: N/A  . Number of Children: 2  . Years of Education: 12   Occupational History  . Designer, industrial/product    Social History Main Topics  . Smoking status: Current Every Day Smoker -- 1.00 packs/day for 35 years    Types: Cigarettes    Last Attempt to Quit: 07/03/2013  . Smokeless tobacco: Never Used  . Alcohol Use: Yes     Comment: RARELY  . Drug Use: No  . Sexual Activity: No   Other Topics Concern  . Not on file   Social History Narrative    Review of Systems: See HPI, otherwise negative ROS  Physical Exam: There were no vitals taken for this visit. General:   Alert,  Well-developed, well-nourished, pleasant and cooperative in NAD Skin:  Intact without significant lesions or rashes.   Impression:   At all and hemorrhoid still present, they are much less symptomatic. Patient is very pleased with her progress. She was commended on her fiber intake. She likes take Benefiber; she could benefit from taking a little more.  Constipation doing much better with lenses, MiraLAX and Benefiber  Recommendations:  Continue Linzess, Miralax and Benefiber as directed  Avoid straining.  Limit toilet time to 2-5 minutes  Call with any interim problems  Office visit in 1 year     Notice: This dictation was prepared with Dragon dictation along with smaller phrase technology. Any transcriptional errors that result from this process are unintentional and may not be corrected upon review.

## 2015-09-14 NOTE — Progress Notes (Signed)
Katelyn [name] 55 y.o.  Filed Vitals:   09/14/15 1342  BP: 170/80  Pulse: 62     Filed Weights   09/14/15 1342  Weight: 171 lb (77.565 kg)    Past Medical History: Past Medical History  Diagnosis Date  . Depression   . Panic attack   . Hypertension   . Hypercholesteremia   . Goiter   . PONV (postoperative nausea and vomiting)   . GERD (gastroesophageal reflux disease)   . Degenerative disc disease, lumbar   . Hepatitis   . Hepatitis C infection 2004    cured with treatment of interferon and ribavirin  . Tubular adenoma 10/14/12  . Hemorrhoids   . Knee pain     Past Surgical History: Past Surgical History  Procedure Laterality Date  . Total shoulder arthroplasty  2011  . Foot surgery Right 2012  . Neck surgery  2005  . Tonsillectomy  1970    and adenoidectomy  . Colonoscopy N/A 10/14/2012    Dr.Rourk-  normal rectum, 3 polyps in the cecum- the largest being approximately 5 mm in diameter. the remainder of the colonic mucosa appeared normal. bx= tubular adenoma  . Back surgery    . Tubal ligation  1988  . Transthoracic echocardiogram  07/2012    EF 55-60%, mod LVH, mod conc LVH, RVSP increased (mild pulm HTN)  . Cardiopulmonary exercise test  08/26/2012    RER 0.94, Peak VO2 87%, HR 69% predicted, normal VO2 curves  . Hemorrhoid banding    . Back surgery  2015    Family History: Family History  Problem Relation Age of Onset  . Colon cancer Neg Hx   . Lung cancer Father   . Hepatitis C Sister     also OA  . Breast cancer Maternal Grandmother     and lymph cancer  . Kidney cancer Maternal Grandfather   . Scoliosis Mother     also kidney infections  . Osteoarthritis Mother   . Glaucoma Mother   . ADD / ADHD Child   . ADD / ADHD Child   . Bipolar disorder Child     Social History: Social History  Substance Use Topics  . Smoking status: Former Smoker -- 0.00 packs/day for 35 years    Types: Cigarettes    Quit date: 07/03/2013  . Smokeless tobacco:  Never Used     Comment: Quit x one month  . Alcohol Use: 0.0 oz/week    0 Standard drinks or equivalent per week     Comment: RARELY    Allergies:  Allergies  Allergen Reactions  . Ancef [Cefazolin] Swelling    Pt had vague constriction of throat post op. Not sure it was abx. But could be so proceed cautiously with cephalosporins   . Sulfa Antibiotics Nausea And Vomiting and Rash      Current outpatient prescriptions:  .  atorvastatin (LIPITOR) 20 MG tablet, Take 20 mg by mouth daily., Disp: , Rfl:  .  FLUoxetine (PROZAC) 40 MG capsule, Take 40 mg by mouth daily.  , Disp: , Rfl:  .  furosemide (LASIX) 20 MG tablet, TAKE (1) OR (2) TABLETS BY MOUTH DAILY AS DIRECTED., Disp: 45 tablet, Rfl: 1 .  HYDROcodone-acetaminophen (NORCO) 10-325 MG per tablet, Take 1 tablet by mouth every 6 (six) hours as needed., Disp: , Rfl:  .  Inulin (FIBER CHOICE PO), Take by mouth daily., Disp: , Rfl:  .  Linaclotide (LINZESS) 290 MCG CAPS capsule, Take 1 capsule (290  mcg total) by mouth daily., Disp: 30 capsule, Rfl: 5 .  lisinopril-hydrochlorothiazide (PRINZIDE,ZESTORETIC) 20-12.5 MG per tablet, TAKE 1 TABLET BY MOUTH ONCE DAILY., Disp: 90 tablet, Rfl: 0 .  methimazole (TAPAZOLE) 5 MG tablet, Take 5 mg by mouth daily., Disp: , Rfl:  .  oxybutynin (DITROPAN) 5 MG tablet, Take 5 mg by mouth daily., Disp: , Rfl:  .  potassium chloride (K-DUR,KLOR-CON) 10 MEQ tablet, TAKE 1 TABLET BY MOUTH ONCE DAILY FOR POTASSIUM REPLACEMENT, Disp: 30 tablet, Rfl: 1 .  tolterodine (DETROL) 1 MG tablet, Take 1 mg by mouth daily. Reported on 09/14/2015, Disp: , Rfl:   History of Present Illness: here for physical. Had normal pap without HPV 7/ 2015.  Saw Dr Buford Dresser today for hemorhoid f/u (had them banded, some came back, but has excellent bowel regimen and is happy). and had normal colonoscopy 2014.  Last mammogram 2015--pt states "they told me to ask you if I could have the other mammogram" d/t "fibrous breasts."  I think she is  asking for the 3D mammogram d/t dense tissue.   Review of Systems   Patient denies any headaches, blurred vision, shortness of breath, chest pain, abdominal pain, problems with bowel movements, urination, or intercourse.   Physical Exam: General:  Well developed, well nourished, no acute distress Skin:  Warm and dry Neck:  Midline trachea, normal thyroid Lungs; Clear to auscultation bilaterally Breast:  No dominant palpable mass, retraction, or nipple discharge Cardiovascular: Regular rate and rhythm Abdomen:  Soft, non tender, no hepatosplenomegaly Pelvic:  External genitalia is normal in appearance.  The vagina is normal in appearance.  The cervix is bulbous.  Uterus is felt to be normal size, shape, and contour.  No adnexal masses or tenderness noted.  Recutm:  Deferred d/t having exam earlier today Extremities:  No swelling or varicosities noted Psych:  No mood changes.     Impression: normal physical     Plan: Will do pap in 2018.  Note given for 3D mammogram d/t dense breast tissue.  Gets screening labs with Dr Brenton Grills

## 2015-09-22 ENCOUNTER — Other Ambulatory Visit: Payer: Self-pay | Admitting: Internal Medicine

## 2015-09-22 NOTE — Telephone Encounter (Signed)
REFILL 

## 2015-10-05 DIAGNOSIS — M170 Bilateral primary osteoarthritis of knee: Secondary | ICD-10-CM | POA: Diagnosis not present

## 2015-10-06 DIAGNOSIS — Z6829 Body mass index (BMI) 29.0-29.9, adult: Secondary | ICD-10-CM | POA: Diagnosis not present

## 2015-10-06 DIAGNOSIS — B351 Tinea unguium: Secondary | ICD-10-CM | POA: Diagnosis not present

## 2015-10-06 DIAGNOSIS — Z0001 Encounter for general adult medical examination with abnormal findings: Secondary | ICD-10-CM | POA: Diagnosis not present

## 2015-10-06 DIAGNOSIS — Z0000 Encounter for general adult medical examination without abnormal findings: Secondary | ICD-10-CM | POA: Diagnosis not present

## 2015-10-06 DIAGNOSIS — M542 Cervicalgia: Secondary | ICD-10-CM | POA: Diagnosis not present

## 2015-10-06 DIAGNOSIS — G894 Chronic pain syndrome: Secondary | ICD-10-CM | POA: Diagnosis not present

## 2015-10-06 DIAGNOSIS — Z79891 Long term (current) use of opiate analgesic: Secondary | ICD-10-CM | POA: Diagnosis not present

## 2015-10-06 DIAGNOSIS — E663 Overweight: Secondary | ICD-10-CM | POA: Diagnosis not present

## 2015-10-06 DIAGNOSIS — Z1389 Encounter for screening for other disorder: Secondary | ICD-10-CM | POA: Diagnosis not present

## 2015-10-07 DIAGNOSIS — Z79891 Long term (current) use of opiate analgesic: Secondary | ICD-10-CM | POA: Diagnosis not present

## 2015-10-07 DIAGNOSIS — M545 Low back pain: Secondary | ICD-10-CM | POA: Diagnosis not present

## 2015-10-07 DIAGNOSIS — M542 Cervicalgia: Secondary | ICD-10-CM | POA: Diagnosis not present

## 2015-10-07 DIAGNOSIS — M25562 Pain in left knee: Secondary | ICD-10-CM | POA: Diagnosis not present

## 2015-10-07 DIAGNOSIS — G894 Chronic pain syndrome: Secondary | ICD-10-CM | POA: Diagnosis not present

## 2015-10-07 DIAGNOSIS — M79652 Pain in left thigh: Secondary | ICD-10-CM | POA: Diagnosis not present

## 2015-10-12 DIAGNOSIS — M170 Bilateral primary osteoarthritis of knee: Secondary | ICD-10-CM | POA: Diagnosis not present

## 2015-10-17 ENCOUNTER — Other Ambulatory Visit: Payer: Self-pay | Admitting: Internal Medicine

## 2015-10-18 ENCOUNTER — Other Ambulatory Visit: Payer: Self-pay | Admitting: Internal Medicine

## 2015-10-18 NOTE — Telephone Encounter (Signed)
NEEDS APPT IN 5/ 2017

## 2015-10-18 NOTE — Telephone Encounter (Signed)
NEEEDS  APPT  11/2015

## 2015-10-19 DIAGNOSIS — M170 Bilateral primary osteoarthritis of knee: Secondary | ICD-10-CM | POA: Diagnosis not present

## 2015-10-30 ENCOUNTER — Other Ambulatory Visit: Payer: Self-pay | Admitting: Internal Medicine

## 2015-11-04 DIAGNOSIS — Z79891 Long term (current) use of opiate analgesic: Secondary | ICD-10-CM | POA: Diagnosis not present

## 2015-11-04 DIAGNOSIS — M25562 Pain in left knee: Secondary | ICD-10-CM | POA: Diagnosis not present

## 2015-11-04 DIAGNOSIS — M542 Cervicalgia: Secondary | ICD-10-CM | POA: Diagnosis not present

## 2015-11-04 DIAGNOSIS — M79652 Pain in left thigh: Secondary | ICD-10-CM | POA: Diagnosis not present

## 2015-11-30 DIAGNOSIS — M170 Bilateral primary osteoarthritis of knee: Secondary | ICD-10-CM | POA: Diagnosis not present

## 2015-12-02 DIAGNOSIS — Z79891 Long term (current) use of opiate analgesic: Secondary | ICD-10-CM | POA: Diagnosis not present

## 2015-12-02 DIAGNOSIS — M79652 Pain in left thigh: Secondary | ICD-10-CM | POA: Diagnosis not present

## 2015-12-02 DIAGNOSIS — M542 Cervicalgia: Secondary | ICD-10-CM | POA: Diagnosis not present

## 2015-12-02 DIAGNOSIS — G8929 Other chronic pain: Secondary | ICD-10-CM | POA: Diagnosis not present

## 2015-12-02 DIAGNOSIS — M25562 Pain in left knee: Secondary | ICD-10-CM | POA: Diagnosis not present

## 2015-12-02 DIAGNOSIS — M545 Low back pain: Secondary | ICD-10-CM | POA: Diagnosis not present

## 2015-12-13 DIAGNOSIS — S8002XA Contusion of left knee, initial encounter: Secondary | ICD-10-CM | POA: Diagnosis not present

## 2015-12-13 DIAGNOSIS — M2241 Chondromalacia patellae, right knee: Secondary | ICD-10-CM | POA: Diagnosis not present

## 2015-12-13 DIAGNOSIS — S8001XA Contusion of right knee, initial encounter: Secondary | ICD-10-CM | POA: Diagnosis not present

## 2015-12-13 DIAGNOSIS — M2242 Chondromalacia patellae, left knee: Secondary | ICD-10-CM | POA: Diagnosis not present

## 2015-12-13 DIAGNOSIS — M6281 Muscle weakness (generalized): Secondary | ICD-10-CM | POA: Diagnosis not present

## 2016-01-04 DIAGNOSIS — M545 Low back pain: Secondary | ICD-10-CM | POA: Diagnosis not present

## 2016-01-04 DIAGNOSIS — G894 Chronic pain syndrome: Secondary | ICD-10-CM | POA: Diagnosis not present

## 2016-01-04 DIAGNOSIS — M25561 Pain in right knee: Secondary | ICD-10-CM | POA: Diagnosis not present

## 2016-01-04 DIAGNOSIS — Z79891 Long term (current) use of opiate analgesic: Secondary | ICD-10-CM | POA: Diagnosis not present

## 2016-01-04 DIAGNOSIS — M25562 Pain in left knee: Secondary | ICD-10-CM | POA: Diagnosis not present

## 2016-01-13 ENCOUNTER — Other Ambulatory Visit: Payer: Self-pay | Admitting: Internal Medicine

## 2016-01-14 NOTE — Telephone Encounter (Signed)
Pt request for medication refills has been refused. The pt has not been seen since February of 2015. The pt was supposed to follow up in 6 months.

## 2016-01-27 DIAGNOSIS — M79652 Pain in left thigh: Secondary | ICD-10-CM | POA: Diagnosis not present

## 2016-01-27 DIAGNOSIS — G894 Chronic pain syndrome: Secondary | ICD-10-CM | POA: Diagnosis not present

## 2016-01-27 DIAGNOSIS — Z79891 Long term (current) use of opiate analgesic: Secondary | ICD-10-CM | POA: Diagnosis not present

## 2016-01-27 DIAGNOSIS — M25562 Pain in left knee: Secondary | ICD-10-CM | POA: Diagnosis not present

## 2016-01-27 DIAGNOSIS — M542 Cervicalgia: Secondary | ICD-10-CM | POA: Diagnosis not present

## 2016-01-27 DIAGNOSIS — M25561 Pain in right knee: Secondary | ICD-10-CM | POA: Diagnosis not present

## 2016-01-28 ENCOUNTER — Other Ambulatory Visit: Payer: Self-pay | Admitting: Internal Medicine

## 2016-02-04 ENCOUNTER — Other Ambulatory Visit: Payer: Self-pay | Admitting: Internal Medicine

## 2016-02-04 NOTE — Telephone Encounter (Signed)
Rx has been sent to the pharmacy electronically. ° °

## 2016-02-14 DIAGNOSIS — Z1389 Encounter for screening for other disorder: Secondary | ICD-10-CM | POA: Diagnosis not present

## 2016-02-14 DIAGNOSIS — B078 Other viral warts: Secondary | ICD-10-CM | POA: Diagnosis not present

## 2016-02-14 DIAGNOSIS — L84 Corns and callosities: Secondary | ICD-10-CM | POA: Diagnosis not present

## 2016-02-14 DIAGNOSIS — I10 Essential (primary) hypertension: Secondary | ICD-10-CM | POA: Diagnosis not present

## 2016-02-14 DIAGNOSIS — E0590 Thyrotoxicosis, unspecified without thyrotoxic crisis or storm: Secondary | ICD-10-CM | POA: Diagnosis not present

## 2016-02-14 DIAGNOSIS — Z6827 Body mass index (BMI) 27.0-27.9, adult: Secondary | ICD-10-CM | POA: Diagnosis not present

## 2016-02-29 DIAGNOSIS — M79672 Pain in left foot: Secondary | ICD-10-CM | POA: Diagnosis not present

## 2016-02-29 DIAGNOSIS — M79671 Pain in right foot: Secondary | ICD-10-CM | POA: Diagnosis not present

## 2016-02-29 DIAGNOSIS — M542 Cervicalgia: Secondary | ICD-10-CM | POA: Diagnosis not present

## 2016-02-29 DIAGNOSIS — M79661 Pain in right lower leg: Secondary | ICD-10-CM | POA: Diagnosis not present

## 2016-02-29 DIAGNOSIS — M545 Low back pain: Secondary | ICD-10-CM | POA: Diagnosis not present

## 2016-02-29 DIAGNOSIS — M25561 Pain in right knee: Secondary | ICD-10-CM | POA: Diagnosis not present

## 2016-02-29 DIAGNOSIS — Z79891 Long term (current) use of opiate analgesic: Secondary | ICD-10-CM | POA: Diagnosis not present

## 2016-02-29 DIAGNOSIS — G894 Chronic pain syndrome: Secondary | ICD-10-CM | POA: Diagnosis not present

## 2016-03-28 DIAGNOSIS — Z79891 Long term (current) use of opiate analgesic: Secondary | ICD-10-CM | POA: Diagnosis not present

## 2016-03-29 DIAGNOSIS — M79605 Pain in left leg: Secondary | ICD-10-CM | POA: Diagnosis not present

## 2016-03-29 DIAGNOSIS — M79652 Pain in left thigh: Secondary | ICD-10-CM | POA: Diagnosis not present

## 2016-03-29 DIAGNOSIS — Z79891 Long term (current) use of opiate analgesic: Secondary | ICD-10-CM | POA: Diagnosis not present

## 2016-03-29 DIAGNOSIS — G894 Chronic pain syndrome: Secondary | ICD-10-CM | POA: Diagnosis not present

## 2016-03-29 DIAGNOSIS — M79604 Pain in right leg: Secondary | ICD-10-CM | POA: Diagnosis not present

## 2016-03-29 DIAGNOSIS — M545 Low back pain: Secondary | ICD-10-CM | POA: Diagnosis not present

## 2016-04-24 DIAGNOSIS — M79675 Pain in left toe(s): Secondary | ICD-10-CM | POA: Diagnosis not present

## 2016-04-24 DIAGNOSIS — M79674 Pain in right toe(s): Secondary | ICD-10-CM | POA: Diagnosis not present

## 2016-04-24 DIAGNOSIS — B351 Tinea unguium: Secondary | ICD-10-CM | POA: Diagnosis not present

## 2016-04-24 DIAGNOSIS — D2371 Other benign neoplasm of skin of right lower limb, including hip: Secondary | ICD-10-CM | POA: Diagnosis not present

## 2016-05-01 DIAGNOSIS — M545 Low back pain: Secondary | ICD-10-CM | POA: Diagnosis not present

## 2016-05-01 DIAGNOSIS — M542 Cervicalgia: Secondary | ICD-10-CM | POA: Diagnosis not present

## 2016-05-01 DIAGNOSIS — M79671 Pain in right foot: Secondary | ICD-10-CM | POA: Diagnosis not present

## 2016-05-01 DIAGNOSIS — M79604 Pain in right leg: Secondary | ICD-10-CM | POA: Diagnosis not present

## 2016-05-01 DIAGNOSIS — M79661 Pain in right lower leg: Secondary | ICD-10-CM | POA: Diagnosis not present

## 2016-05-01 DIAGNOSIS — G894 Chronic pain syndrome: Secondary | ICD-10-CM | POA: Diagnosis not present

## 2016-05-01 DIAGNOSIS — Z79891 Long term (current) use of opiate analgesic: Secondary | ICD-10-CM | POA: Diagnosis not present

## 2016-05-01 DIAGNOSIS — R102 Pelvic and perineal pain: Secondary | ICD-10-CM | POA: Diagnosis not present

## 2016-05-02 ENCOUNTER — Encounter: Payer: Medicare Other | Admitting: Vascular Surgery

## 2016-05-09 DIAGNOSIS — Z23 Encounter for immunization: Secondary | ICD-10-CM | POA: Diagnosis not present

## 2016-05-18 ENCOUNTER — Encounter: Payer: Self-pay | Admitting: Vascular Surgery

## 2016-05-19 ENCOUNTER — Other Ambulatory Visit: Payer: Self-pay | Admitting: Internal Medicine

## 2016-05-20 ENCOUNTER — Other Ambulatory Visit: Payer: Self-pay | Admitting: Gastroenterology

## 2016-05-22 ENCOUNTER — Encounter: Payer: Self-pay | Admitting: Vascular Surgery

## 2016-05-22 ENCOUNTER — Ambulatory Visit (INDEPENDENT_AMBULATORY_CARE_PROVIDER_SITE_OTHER): Payer: Medicare Other | Admitting: Vascular Surgery

## 2016-05-22 VITALS — BP 127/65 | HR 60 | Temp 98.1°F | Resp 14 | Ht 65.0 in | Wt 156.0 lb

## 2016-05-22 DIAGNOSIS — M79604 Pain in right leg: Secondary | ICD-10-CM | POA: Diagnosis not present

## 2016-05-22 DIAGNOSIS — M79605 Pain in left leg: Secondary | ICD-10-CM

## 2016-05-22 NOTE — Telephone Encounter (Signed)
Rx(s) sent to pharmacy electronically.  

## 2016-05-22 NOTE — Progress Notes (Signed)
Subjective:     Patient ID: Katelyn [name], female   DOB: 04/18/1961, 55 y.o.   MRN: [id number]  HPI This 55 year old female was referred by Dr  Lauretta Chester for evaluation of bilateral leg pain and possible venous disease. Patient has no history of DVT thrombophlebitis stasis ulcers bleeding or any venous complications. She does have some mild swelling in both ankles as the day progresses. She complains of leg pain throughout the day worse on the right than the left. In the right it is in the distal thigh and lateral calf and on the left disease in the lateral calf. She develops this at night and during the day. She does not were elastic compression stockings. She has a history of multiple spine procedures including cervical spine fusion and lumbar spine fusion.  Past Medical History:  Diagnosis Date  . Degenerative disc disease, lumbar   . Depression   . GERD (gastroesophageal reflux disease)   . Goiter   . Hemorrhoids   . Hepatitis   . Hepatitis C infection 2004   cured with treatment of interferon and ribavirin  . Hypercholesteremia   . Hypertension   . Knee pain   . Panic attack   . PONV (postoperative nausea and vomiting)   . Tubular adenoma 10/14/12  . Varicose veins of both lower extremities with complications     Social History  Substance Use Topics  . Smoking status: Former Smoker    Packs/day: 0.00    Years: 35.00    Types: Cigarettes    Quit date: 07/03/2013  . Smokeless tobacco: Never Used     Comment: Quit x one month  . Alcohol use 0.0 oz/week     Comment: RARELY    Family History  Problem Relation Age of Onset  . Scoliosis Mother     also kidney infections  . Osteoarthritis Mother   . Glaucoma Mother   . Lung cancer Father   . Hepatitis C Sister     also OA  . Breast cancer Maternal Grandmother     and lymph cancer  . Kidney cancer Maternal Grandfather   . ADD / ADHD Child   . ADD / ADHD Child   . Bipolar disorder Child   . Colon cancer Neg Hx      Allergies  Allergen Reactions  . Ancef [Cefazolin] Swelling    Pt had vague constriction of throat post op. Not sure it was abx. But could be so proceed cautiously with cephalosporins   . Sulfa Antibiotics Nausea And Vomiting and Rash     Current Outpatient Prescriptions:  .  atorvastatin (LIPITOR) 20 MG tablet, Take 20 mg by mouth daily., Disp: , Rfl:  .  FLUoxetine (PROZAC) 40 MG capsule, Take 40 mg by mouth daily.  , Disp: , Rfl:  .  furosemide (LASIX) 20 MG tablet, TAKE 1 TO 2 TABLETS BY MOUTH DAILY AS DIRECTED, Disp: 45 tablet, Rfl: 3 .  HYDROcodone-acetaminophen (NORCO) 10-325 MG per tablet, Take 1 tablet by mouth every 6 (six) hours as needed., Disp: , Rfl:  .  Inulin (FIBER CHOICE PO), Take by mouth daily., Disp: , Rfl:  .  LINZESS 290 MCG CAPS capsule, TAKE (1) CAPSULE BY MOUTH ONCE DAILY., Disp: 30 capsule, Rfl: 5 .  lisinopril-hydrochlorothiazide (PRINZIDE,ZESTORETIC) 20-12.5 MG tablet, TAKE (1) TABLET BY MOUTH DAILY FOR HIGH BLOOD PRESSURE., Disp: 15 tablet, Rfl: 0 .  methimazole (TAPAZOLE) 5 MG tablet, Take 5 mg by mouth daily., Disp: , Rfl:  .  oxybutynin (DITROPAN) 5 MG tablet, Take 5 mg by mouth daily., Disp: , Rfl:  .  potassium chloride (K-DUR,KLOR-CON) 10 MEQ tablet, TAKE 1 TABLET BY MOUTH ONCE DAILY FOR POTASSIUM REPLACEMENT, Disp: 30 tablet, Rfl: 3 .  tolterodine (DETROL) 1 MG tablet, Take 1 mg by mouth daily. Reported on 09/14/2015, Disp: , Rfl:   Vitals:   05/22/16 1416  BP: 127/65  Pulse: 60  Resp: 14  Temp: 98.1 F (36.7 C)  SpO2: 98%  Weight: 156 lb (70.8 kg)  Height: 5\' 5"  (1.651 m)    Body mass index is 25.96 kg/m.         Review of Systems Denies chest pain, dyspnea on exertion, PND, orthopnea, hemoptysis    Objective:   Physical Exam BP 127/65 (BP Location: Left Arm, Patient Position: Sitting, Cuff Size: Normal)   Pulse 60   Temp 98.1 F (36.7 C)   Resp 14   Ht 5\' 5"  (1.651 m)   Wt 156 lb (70.8 kg)   SpO2 98%   BMI 25.96 kg/m      Gen.-alert and oriented x3 in no apparent distress HEENT normal for age Lungs no rhonchi or wheezing Cardiovascular regular rhythm no murmurs carotid pulses 3+ palpable no bruits audible Abdomen soft nontender no palpable masses Musculoskeletal free of  major deformities Skin clear -no rashes Neurologic normal Lower extremities 3+ femoral and dorsalis pedis pulses palpable bilaterally with no edema No bulging varicosities, hyperpigmentation, spider veins, or reticular veins are noted on physical exam  Today I imaged both lower extremity great saphenous veins with the SonoSite-ultrasound. There is no abnormality noted with small caliber veins with no reflux       Assessment:     Bilateral leg pain etiology unknown No evidence of significant venous pathology and no need for further workup    Plan:     Etiology could be related to previous lumbar spine fusion but no need for any further vascular workup Return when necessary basis

## 2016-05-31 DIAGNOSIS — M25552 Pain in left hip: Secondary | ICD-10-CM | POA: Diagnosis not present

## 2016-05-31 DIAGNOSIS — M79662 Pain in left lower leg: Secondary | ICD-10-CM | POA: Diagnosis not present

## 2016-05-31 DIAGNOSIS — M25561 Pain in right knee: Secondary | ICD-10-CM | POA: Diagnosis not present

## 2016-05-31 DIAGNOSIS — M545 Low back pain: Secondary | ICD-10-CM | POA: Diagnosis not present

## 2016-05-31 DIAGNOSIS — M25562 Pain in left knee: Secondary | ICD-10-CM | POA: Diagnosis not present

## 2016-05-31 DIAGNOSIS — Z79891 Long term (current) use of opiate analgesic: Secondary | ICD-10-CM | POA: Diagnosis not present

## 2016-05-31 DIAGNOSIS — G894 Chronic pain syndrome: Secondary | ICD-10-CM | POA: Diagnosis not present

## 2016-05-31 DIAGNOSIS — R102 Pelvic and perineal pain: Secondary | ICD-10-CM | POA: Diagnosis not present

## 2016-05-31 DIAGNOSIS — M79605 Pain in left leg: Secondary | ICD-10-CM | POA: Diagnosis not present

## 2016-06-07 DIAGNOSIS — F429 Obsessive-compulsive disorder, unspecified: Secondary | ICD-10-CM | POA: Diagnosis not present

## 2016-06-14 DIAGNOSIS — Z1389 Encounter for screening for other disorder: Secondary | ICD-10-CM | POA: Diagnosis not present

## 2016-06-14 DIAGNOSIS — E663 Overweight: Secondary | ICD-10-CM | POA: Diagnosis not present

## 2016-06-14 DIAGNOSIS — L0100 Impetigo, unspecified: Secondary | ICD-10-CM | POA: Diagnosis not present

## 2016-06-14 DIAGNOSIS — Z6827 Body mass index (BMI) 27.0-27.9, adult: Secondary | ICD-10-CM | POA: Diagnosis not present

## 2016-06-14 DIAGNOSIS — G894 Chronic pain syndrome: Secondary | ICD-10-CM | POA: Diagnosis not present

## 2016-06-14 DIAGNOSIS — N179 Acute kidney failure, unspecified: Secondary | ICD-10-CM | POA: Diagnosis not present

## 2016-06-14 DIAGNOSIS — I10 Essential (primary) hypertension: Secondary | ICD-10-CM | POA: Diagnosis not present

## 2016-06-14 DIAGNOSIS — R58 Hemorrhage, not elsewhere classified: Secondary | ICD-10-CM | POA: Diagnosis not present

## 2016-06-14 DIAGNOSIS — B3783 Candidal cheilitis: Secondary | ICD-10-CM | POA: Diagnosis not present

## 2016-06-14 DIAGNOSIS — S76012A Strain of muscle, fascia and tendon of left hip, initial encounter: Secondary | ICD-10-CM | POA: Diagnosis not present

## 2016-06-19 DIAGNOSIS — Z79891 Long term (current) use of opiate analgesic: Secondary | ICD-10-CM | POA: Diagnosis not present

## 2016-06-28 ENCOUNTER — Ambulatory Visit: Payer: Medicare Other | Admitting: Podiatry

## 2016-06-30 ENCOUNTER — Encounter: Payer: Self-pay | Admitting: Podiatry

## 2016-06-30 ENCOUNTER — Ambulatory Visit (INDEPENDENT_AMBULATORY_CARE_PROVIDER_SITE_OTHER): Payer: Medicare Other | Admitting: Podiatry

## 2016-06-30 DIAGNOSIS — L84 Corns and callosities: Secondary | ICD-10-CM

## 2016-06-30 DIAGNOSIS — M779 Enthesopathy, unspecified: Secondary | ICD-10-CM

## 2016-06-30 MED ORDER — TRIAMCINOLONE ACETONIDE 10 MG/ML IJ SUSP
10.0000 mg | Freq: Once | INTRAMUSCULAR | Status: AC
  Administered 2016-06-30: 10 mg

## 2016-07-02 NOTE — Progress Notes (Signed)
Subjective:     Patient ID: Katelyn [name], female   DOB: 15-Nov-1960, 55 y.o.   MRN: [id number]  HPI patient presents with chronic tendinitis capsulitis-like condition plantar aspect right foot that's very painful when pressed. States she's tried to trim in the past   Review of Systems  All other systems reviewed and are negative.      Objective:   Physical Exam  Constitutional: She is oriented to person, place, and time.  Cardiovascular: Intact distal pulses.   Musculoskeletal: Normal range of motion.  Neurological: She is oriented to person, place, and time.  Skin: Skin is warm.  Nursing note and vitals reviewed.  neurovascular status intact muscle strength adequate range of motion within normal limits with patient noted to have a inflammatory area had a fifth metatarsal right with fluid buildup and lesion formation that's very painful. She is noted to have good digital perfusion and is well oriented 3     Assessment:     Inflammatory capsulitis fifth MPJ right with pain and deformity    Plan:     H&P condition reviewed education rendered. Today I went ahead did careful injection of the area [facility] some Kenalog 5 g Xylocaine and did deep debridement of lesion and we'll see back as needed  X-ray report was negative for signs of fracture with mild arthritis and spur formation

## 2016-07-24 HISTORY — PX: FOOT SURGERY: SHX648

## 2016-07-27 ENCOUNTER — Encounter: Payer: Self-pay | Admitting: Internal Medicine

## 2016-08-02 ENCOUNTER — Other Ambulatory Visit (HOSPITAL_COMMUNITY): Payer: Self-pay | Admitting: Internal Medicine

## 2016-08-02 ENCOUNTER — Ambulatory Visit (HOSPITAL_COMMUNITY)
Admission: RE | Admit: 2016-08-02 | Discharge: 2016-08-02 | Disposition: A | Discharge status: Home / Self Care | Payer: Medicare Other | Source: Ambulatory Visit | Attending: Internal Medicine | Admitting: Internal Medicine

## 2016-08-02 DIAGNOSIS — S79811S Other specified injuries of right hip, sequela: Secondary | ICD-10-CM | POA: Diagnosis not present

## 2016-08-02 DIAGNOSIS — W19XXXS Unspecified fall, sequela: Secondary | ICD-10-CM

## 2016-08-02 DIAGNOSIS — Z6827 Body mass index (BMI) 27.0-27.9, adult: Secondary | ICD-10-CM | POA: Diagnosis not present

## 2016-08-02 DIAGNOSIS — S79911A Unspecified injury of right hip, initial encounter: Secondary | ICD-10-CM | POA: Diagnosis not present

## 2016-08-02 DIAGNOSIS — I10 Essential (primary) hypertension: Secondary | ICD-10-CM | POA: Diagnosis not present

## 2016-08-02 DIAGNOSIS — Z981 Arthrodesis status: Secondary | ICD-10-CM | POA: Insufficient documentation

## 2016-08-02 DIAGNOSIS — R52 Pain, unspecified: Secondary | ICD-10-CM

## 2016-08-02 DIAGNOSIS — X58XXXA Exposure to other specified factors, initial encounter: Secondary | ICD-10-CM | POA: Diagnosis not present

## 2016-08-02 DIAGNOSIS — M25551 Pain in right hip: Secondary | ICD-10-CM | POA: Diagnosis not present

## 2016-08-02 DIAGNOSIS — Z1389 Encounter for screening for other disorder: Secondary | ICD-10-CM | POA: Diagnosis not present

## 2016-08-04 ENCOUNTER — Ambulatory Visit (INDEPENDENT_AMBULATORY_CARE_PROVIDER_SITE_OTHER): Payer: Medicare Other

## 2016-08-04 ENCOUNTER — Encounter: Payer: Self-pay | Admitting: Podiatry

## 2016-08-04 ENCOUNTER — Ambulatory Visit (INDEPENDENT_AMBULATORY_CARE_PROVIDER_SITE_OTHER): Payer: Medicare Other | Admitting: Podiatry

## 2016-08-04 DIAGNOSIS — M79671 Pain in right foot: Secondary | ICD-10-CM

## 2016-08-04 DIAGNOSIS — M779 Enthesopathy, unspecified: Secondary | ICD-10-CM | POA: Diagnosis not present

## 2016-08-04 DIAGNOSIS — M216X9 Other acquired deformities of unspecified foot: Secondary | ICD-10-CM | POA: Diagnosis not present

## 2016-08-04 NOTE — Patient Instructions (Signed)

## 2016-08-07 ENCOUNTER — Encounter: Payer: Self-pay | Admitting: Orthopedic Surgery

## 2016-08-07 NOTE — Progress Notes (Signed)
Subjective:     Patient ID: Katelyn [name], female   DOB: 1961-06-16, 56 y.o.   MRN: [id number]  HPI patient presents stating this calluses Apley killing me and I cannot walk   Review of Systems     Objective:   Physical Exam Neurovascular status intact with chronic lesion sub-fourth metatarsal that we worked on several times and she's had worked on the past without relief and it's getting worse and making ambulation very difficult    Assessment:     Plantarflexed metatarsal with lesion formation fourth right with exquisite discomfort    Plan:     H&P condition reviewed at great length. I reviewed x-ray with marker and I explained that the best chance we have is elevating osteotomy with removal of plantar skin wedge but there is absolutely no long-term guarantees this will solve the problem. Patient wants surgery and at this time she is allowed to read consent form going over all possible complications alternative treatments and the fact there is no guarantee this. Her problem. Patient signed consent form is given all preoperative instructions and air fracture walker to wear prior to surgery and will be seen back for procedure and encouraged to call with questions  X-ray report indicates that there is pressure against the fourth metatarsal right

## 2016-08-15 ENCOUNTER — Encounter: Payer: Self-pay | Admitting: Podiatry

## 2016-08-15 DIAGNOSIS — D2371 Other benign neoplasm of skin of right lower limb, including hip: Secondary | ICD-10-CM | POA: Diagnosis not present

## 2016-08-15 DIAGNOSIS — M21541 Acquired clubfoot, right foot: Secondary | ICD-10-CM | POA: Diagnosis not present

## 2016-08-15 DIAGNOSIS — M2041 Other hammer toe(s) (acquired), right foot: Secondary | ICD-10-CM | POA: Diagnosis not present

## 2016-08-15 DIAGNOSIS — E7800 Pure hypercholesterolemia, unspecified: Secondary | ICD-10-CM | POA: Diagnosis not present

## 2016-08-15 DIAGNOSIS — D2372 Other benign neoplasm of skin of left lower limb, including hip: Secondary | ICD-10-CM | POA: Diagnosis not present

## 2016-08-15 DIAGNOSIS — M799 Soft tissue disorder, unspecified: Secondary | ICD-10-CM | POA: Diagnosis not present

## 2016-08-15 DIAGNOSIS — M216X1 Other acquired deformities of right foot: Secondary | ICD-10-CM | POA: Diagnosis not present

## 2016-08-17 ENCOUNTER — Telehealth: Payer: Self-pay | Admitting: *Deleted

## 2016-08-17 NOTE — Telephone Encounter (Signed)
"  I need to talk to you about getting some papers faxed.  You can reach me at this number [phone] or [phone].  Thank you so much."

## 2016-08-18 ENCOUNTER — Telehealth: Payer: Self-pay | Admitting: *Deleted

## 2016-08-18 MED ORDER — HYDROCODONE-ACETAMINOPHEN 10-325 MG PO TABS: ORAL_TABLET | ORAL | 0 refills | Status: DC

## 2016-08-18 NOTE — Telephone Encounter (Signed)
Pt states she had surgery with Dr. Paulla Dolly 08/15/2016 and will not have enough pain medication to get through the weekend. Dr. Paulla Dolly states refill Hydrocodone 10/325mg  #25 1 or 2 tablets every 4-6 hours and to have pt take off ace wrap. I instructed pt to pick up the rx in the [place] office, and that Dr. Paulla Dolly had wanted her to remove the surgery boot, open-ended sock, ace wrap, then elevate the foot for 15 minutes, if the foot pain worsened during this period of elevation dangle for 15 minutes this being the only time it is appropriate to dangle the surgery foot, then place foot at hip level and rewrap the ace from the toes up the foot looser, reapply the open-ended sock, and surgery boot. I told pt she must be in the surgery boot at all times. Pt states understanding.

## 2016-08-21 NOTE — Progress Notes (Signed)
DOS 01.23.2018 Elevating osteotomy 3rd right with screw. Removal plantar skin wedge right.

## 2016-08-22 ENCOUNTER — Ambulatory Visit: Payer: Medicare Other | Admitting: Orthopedic Surgery

## 2016-08-23 ENCOUNTER — Ambulatory Visit (INDEPENDENT_AMBULATORY_CARE_PROVIDER_SITE_OTHER): Payer: Medicare Other

## 2016-08-23 ENCOUNTER — Ambulatory Visit (INDEPENDENT_AMBULATORY_CARE_PROVIDER_SITE_OTHER): Payer: Medicare Other | Admitting: Podiatry

## 2016-08-23 ENCOUNTER — Encounter: Payer: Self-pay | Admitting: Podiatry

## 2016-08-23 VITALS — BP 130/71 | Temp 98.1°F

## 2016-08-23 DIAGNOSIS — M216X9 Other acquired deformities of unspecified foot: Secondary | ICD-10-CM | POA: Diagnosis not present

## 2016-08-24 NOTE — Progress Notes (Signed)
Subjective:     Patient ID: Katelyn [name], female   DOB: 12-27-1960, 56 y.o.   MRN: [id number]  HPI patient presents stating she's doing really well with her right foot with minimal discomfort or swelling   Review of Systems     Objective:   Physical Exam Neurovascular status intact with patient's right foot doing well with minimal edema and wound edges that are well coapted dorsal plantar with no drainage    Assessment:     Doing well post surgery right foot    Plan:     Continue elevation immobilization compression reapplied dressing and instructed on gradual increase in activity levels. Gave strict instructions to come in if any issues were to occur and patient will be seen back in 2 weeks for stitch removal  X-ray report indicated screw was in place osteotomy is healing well with no indications of movement

## 2016-08-28 DIAGNOSIS — N179 Acute kidney failure, unspecified: Secondary | ICD-10-CM | POA: Diagnosis not present

## 2016-08-28 DIAGNOSIS — J329 Chronic sinusitis, unspecified: Secondary | ICD-10-CM | POA: Diagnosis not present

## 2016-08-28 DIAGNOSIS — Z6827 Body mass index (BMI) 27.0-27.9, adult: Secondary | ICD-10-CM | POA: Diagnosis not present

## 2016-08-28 DIAGNOSIS — F17200 Nicotine dependence, unspecified, uncomplicated: Secondary | ICD-10-CM | POA: Diagnosis not present

## 2016-09-06 ENCOUNTER — Ambulatory Visit (INDEPENDENT_AMBULATORY_CARE_PROVIDER_SITE_OTHER): Payer: Medicare Other

## 2016-09-06 ENCOUNTER — Ambulatory Visit (INDEPENDENT_AMBULATORY_CARE_PROVIDER_SITE_OTHER): Payer: Medicare Other | Admitting: Podiatry

## 2016-09-06 DIAGNOSIS — M216X9 Other acquired deformities of unspecified foot: Secondary | ICD-10-CM

## 2016-09-06 NOTE — Progress Notes (Signed)
Subjective:     Patient ID: Katelyn [name], female   DOB: 1961-04-11, 56 y.o.   MRN: [id number]  HPI patient states that she's doing really well with her surgery with minimal discomfort or swelling   Review of Systems     Objective:   Physical Exam Neurovascular status intact negative Homans sign was noted with incision sites plantar right doing well with wound edges well coapted and good healing of the dorsal incision site with minimal edema    Assessment:     Doing well post surgery right foot    Plan:     X-ray reviewed stitches removed wound edges coapted well and gradual return soft shoe over the next several weeks. Continue with degree of immobilization elevation compression and reappoint 4 weeks or earlier if needed  X-ray report indicate screws in place good alignment noted no signs of pathology currently

## 2016-10-04 ENCOUNTER — Ambulatory Visit (INDEPENDENT_AMBULATORY_CARE_PROVIDER_SITE_OTHER): Payer: Medicare Other | Admitting: Podiatry

## 2016-10-04 ENCOUNTER — Ambulatory Visit (INDEPENDENT_AMBULATORY_CARE_PROVIDER_SITE_OTHER): Payer: Medicare Other

## 2016-10-04 DIAGNOSIS — M216X9 Other acquired deformities of unspecified foot: Secondary | ICD-10-CM | POA: Diagnosis not present

## 2016-10-04 DIAGNOSIS — Q828 Other specified congenital malformations of skin: Secondary | ICD-10-CM

## 2016-10-05 NOTE — Progress Notes (Signed)
Subjective:     Patient ID: Katelyn [name], female   DOB: [birth year], 56 y.o.   MRN: [id number]  HPI patient states she's been doing well but she is getting some lesions underneath her foot   Review of Systems     Objective:   Physical Exam Neurovascular status intact with well-healed surgical site third metatarsal right with plantar lesions that are not in the exact same place as previous but present    Assessment:     Porokeratotic lesion plantar right not in the same position prior to the original surgery    Plan:     X-ray taken reviewed and debrided lesion and applied medication and patient be seen back as needed. Hopefully this will reduce it to a significant degree  X-ray report indicates the screws in place no indications of movement currently

## 2016-10-13 DIAGNOSIS — Z6827 Body mass index (BMI) 27.0-27.9, adult: Secondary | ICD-10-CM | POA: Diagnosis not present

## 2016-10-13 DIAGNOSIS — M5410 Radiculopathy, site unspecified: Secondary | ICD-10-CM | POA: Diagnosis not present

## 2016-10-26 DIAGNOSIS — M533 Sacrococcygeal disorders, not elsewhere classified: Secondary | ICD-10-CM | POA: Diagnosis not present

## 2016-10-26 DIAGNOSIS — M545 Low back pain: Secondary | ICD-10-CM | POA: Diagnosis not present

## 2016-10-26 DIAGNOSIS — M6249 Contracture of muscle, multiple sites: Secondary | ICD-10-CM | POA: Diagnosis not present

## 2016-10-26 DIAGNOSIS — M6259 Muscle wasting and atrophy, not elsewhere classified, multiple sites: Secondary | ICD-10-CM | POA: Diagnosis not present

## 2016-10-27 DIAGNOSIS — M6249 Contracture of muscle, multiple sites: Secondary | ICD-10-CM | POA: Diagnosis not present

## 2016-10-27 DIAGNOSIS — M6259 Muscle wasting and atrophy, not elsewhere classified, multiple sites: Secondary | ICD-10-CM | POA: Diagnosis not present

## 2016-10-27 DIAGNOSIS — M545 Low back pain: Secondary | ICD-10-CM | POA: Diagnosis not present

## 2016-10-27 DIAGNOSIS — M533 Sacrococcygeal disorders, not elsewhere classified: Secondary | ICD-10-CM | POA: Diagnosis not present

## 2016-11-06 ENCOUNTER — Emergency Department (HOSPITAL_COMMUNITY): Payer: Medicare Other

## 2016-11-06 ENCOUNTER — Emergency Department (HOSPITAL_COMMUNITY)
Admission: EM | Admit: 2016-11-06 | Discharge: 2016-11-06 | Discharge status: Against Medical Advice | Payer: Medicare Other

## 2016-11-06 ENCOUNTER — Encounter (HOSPITAL_COMMUNITY): Payer: Self-pay | Admitting: Adult Health

## 2016-11-06 DIAGNOSIS — R0602 Shortness of breath: Secondary | ICD-10-CM | POA: Diagnosis not present

## 2016-11-06 DIAGNOSIS — Z87891 Personal history of nicotine dependence: Secondary | ICD-10-CM | POA: Insufficient documentation

## 2016-11-06 DIAGNOSIS — Z79899 Other long term (current) drug therapy: Secondary | ICD-10-CM | POA: Insufficient documentation

## 2016-11-06 DIAGNOSIS — R600 Localized edema: Secondary | ICD-10-CM | POA: Diagnosis not present

## 2016-11-06 DIAGNOSIS — N3000 Acute cystitis without hematuria: Secondary | ICD-10-CM | POA: Insufficient documentation

## 2016-11-06 DIAGNOSIS — M533 Sacrococcygeal disorders, not elsewhere classified: Secondary | ICD-10-CM | POA: Diagnosis not present

## 2016-11-06 DIAGNOSIS — M6249 Contracture of muscle, multiple sites: Secondary | ICD-10-CM | POA: Diagnosis not present

## 2016-11-06 DIAGNOSIS — I10 Essential (primary) hypertension: Secondary | ICD-10-CM | POA: Insufficient documentation

## 2016-11-06 DIAGNOSIS — R05 Cough: Secondary | ICD-10-CM | POA: Diagnosis not present

## 2016-11-06 DIAGNOSIS — M545 Low back pain: Secondary | ICD-10-CM | POA: Diagnosis not present

## 2016-11-06 DIAGNOSIS — R1030 Lower abdominal pain, unspecified: Secondary | ICD-10-CM | POA: Diagnosis present

## 2016-11-06 DIAGNOSIS — M6259 Muscle wasting and atrophy, not elsewhere classified, multiple sites: Secondary | ICD-10-CM | POA: Diagnosis not present

## 2016-11-06 LAB — CBC
HEMATOCRIT: 38 % (ref 36.0–46.0)
Hemoglobin: 12.9 g/dL (ref 12.0–15.0)
MCH: 32.1 pg (ref 26.0–34.0)
MCHC: 33.9 g/dL (ref 30.0–36.0)
MCV: 94.5 fL (ref 78.0–100.0)
PLATELETS: 240 10*3/uL (ref 150–400)
RBC: 4.02 MIL/uL (ref 3.87–5.11)
RDW: 12.6 % (ref 11.5–15.5)
WBC: 6.5 10*3/uL (ref 4.0–10.5)

## 2016-11-06 LAB — BASIC METABOLIC PANEL
Anion gap: 7 (ref 5–15)
BUN: 12 mg/dL (ref 6–20)
CO2: 28 mmol/L (ref 22–32)
CREATININE: 0.64 mg/dL (ref 0.44–1.00)
Calcium: 8.9 mg/dL (ref 8.9–10.3)
Chloride: 101 mmol/L (ref 101–111)
GFR calc non Af Amer: >60 mL/min (ref >60)
Glucose, Bld: 96 mg/dL (ref 65–99)
POTASSIUM: 3.2 mmol/L — AB (ref 3.5–5.1)
Sodium: 136 mmol/L (ref 135–145)

## 2016-11-06 NOTE — ED Triage Notes (Signed)
Presents with bilateral lower leg and pedal edema, +2 pitting. Hx of CHF, noticed she was gaining weight over last 2 weeks and doubled up on her lasix, denies relief. Also c/o bilateral groin pain that  is worse with urinating and tryin g to have a BM. She states she feels like she isn't making a lot of urine. Abdominal pain is rated 8/10.  Endorses fatigue.

## 2016-11-06 NOTE — ED Notes (Signed)
No answer in waiting area.

## 2016-11-07 ENCOUNTER — Emergency Department (HOSPITAL_COMMUNITY)
Admission: EM | Admit: 2016-11-07 | Discharge: 2016-11-07 | Disposition: A | Discharge status: Home / Self Care | Payer: Medicare Other | Attending: Emergency Medicine | Admitting: Emergency Medicine

## 2016-11-07 DIAGNOSIS — R600 Localized edema: Secondary | ICD-10-CM

## 2016-11-07 DIAGNOSIS — N3000 Acute cystitis without hematuria: Secondary | ICD-10-CM | POA: Diagnosis not present

## 2016-11-07 DIAGNOSIS — R609 Edema, unspecified: Secondary | ICD-10-CM

## 2016-11-07 LAB — URINALYSIS, ROUTINE W REFLEX MICROSCOPIC
Bacteria, UA: NONE SEEN
Bilirubin Urine: NEGATIVE
GLUCOSE, UA: NEGATIVE mg/dL
HGB URINE DIPSTICK: NEGATIVE
KETONES UR: NEGATIVE mg/dL
Nitrite: NEGATIVE
PROTEIN: NEGATIVE mg/dL
Specific Gravity, Urine: 1.028 (ref 1.005–1.030)
pH: 5 (ref 5.0–8.0)

## 2016-11-07 LAB — HEPATIC FUNCTION PANEL
ALT: 23 U/L (ref 14–54)
AST: 23 U/L (ref 15–41)
Albumin: 3.9 g/dL (ref 3.5–5.0)
Alkaline Phosphatase: 75 U/L (ref 38–126)
BILIRUBIN DIRECT: 0.1 mg/dL (ref 0.1–0.5)
BILIRUBIN INDIRECT: 0.1 mg/dL — AB (ref 0.3–0.9)
Total Bilirubin: 0.2 mg/dL — ABNORMAL LOW (ref 0.3–1.2)
Total Protein: 7 g/dL (ref 6.5–8.1)

## 2016-11-07 LAB — BRAIN NATRIURETIC PEPTIDE: B Natriuretic Peptide: 91 pg/mL (ref 0.0–100.0)

## 2016-11-07 LAB — TROPONIN I: Troponin I: <0.03 ng/mL (ref <0.03)

## 2016-11-07 MED ORDER — CIPROFLOXACIN HCL 250 MG PO TABS
500.0000 mg | ORAL_TABLET | Freq: Once | ORAL | Status: AC
  Administered 2016-11-07: 500 mg via ORAL
  Filled 2016-11-07: qty 2

## 2016-11-07 MED ORDER — CIPROFLOXACIN HCL 500 MG PO TABS: 500.0000 mg | ORAL_TABLET | Freq: Two times a day (BID) | ORAL | 0 refills | Status: DC

## 2016-11-07 NOTE — ED Provider Notes (Signed)
[facility] DEPT Provider Note   CSN: [id number] Arrival date & time: 11/06/16  2043     History   Chief Complaint Chief Complaint  Patient presents with  . Leg Swelling    HPI Katelyn [name] is a 56 y.o. female.  HPI Pt comes in with cc of leg swelling. Pt has hx of hep c, HTN, > CHF. Pt reports that she has been having increased swelling to the legs bilaterally x 2 weeks. Leg swelling is worse during day. Pt is on 20 mg lasix , that she doubles up on it as needed. Despite the lasix , pt hasnt seen marked improvement in the legs.  Pt also has groin pain / suprapubic pain x 5 months. Pt reports the the pain is constant. Pain  Started with a mechanical fall.   Pt also has back pain. She is supposed to get MRI. Pt has no associated numbness, weakness, urinary incontinence, urinary retention, bowel incontinence, pins and needle sensation in the perineal area.  Patient has pain with urination, but she denies blood in the urine, or frequent urination. Pt also denies any vaginal discharge or bleeding.    Past Medical History:  Diagnosis Date  . Degenerative disc disease, lumbar   . Depression   . GERD (gastroesophageal reflux disease)   . Goiter   . Hemorrhoids   . Hepatitis   . Hepatitis C infection 2004   cured with treatment of interferon and ribavirin  . Hypercholesteremia   . Hypertension   . Knee pain   . Panic attack   . PONV (postoperative nausea and vomiting)   . Tubular adenoma 10/14/12  . Varicose veins of both lower extremities with complications     Patient Active Problem List   Diagnosis Date Noted  . Bilateral leg pain 05/22/2016  . Hemorrhoids 05/12/2014  . HTN (hypertension) 09/03/2013  . Peripheral neuropathy 09/03/2013  . Leg edema 09/03/2013  . Status post lumbar spinal fusion 05/07/2013  . ARF (acute renal failure) ([place]) 02/05/2013  . Hyponatremia 02/05/2013  . Abdominal pain 02/05/2013  . TOBACCO ABUSE 05/07/2009  . LEG PAIN,  BILATERAL 05/07/2009  . HEPATITIS C 05/04/2009  . DEPRESSION 05/04/2009  . ACID REFLUX DISEASE 05/04/2009  . UTI 05/04/2009  . EDEMA LEG 05/04/2009    Past Surgical History:  Procedure Laterality Date  . BACK SURGERY    . BACK SURGERY  2015  . CARDIOPULMONARY EXERCISE TEST  08/26/2012   RER 0.94, Peak VO2 87%, HR 69% predicted, normal VO2 curves  . COLONOSCOPY N/A 10/14/2012   Dr.Rourk-  normal rectum, 3 polyps in the cecum- the largest being approximately 5 mm in diameter. the remainder of the colonic mucosa appeared normal. bx= tubular adenoma  . FOOT SURGERY Right 2012  . HEMORRHOID BANDING    . NECK SURGERY  2005  . TONSILLECTOMY  1970   and adenoidectomy  . TOTAL SHOULDER ARTHROPLASTY  2011  . TRANSTHORACIC ECHOCARDIOGRAM  07/2012   EF 55-60%, mod LVH, mod conc LVH, RVSP increased (mild pulm HTN)  . TUBAL LIGATION  1988    OB History    Gravida Para Term Preterm AB Living   2 2 1 1   2    SAB TAB Ectopic Multiple Live Births                   Home Medications    Prior to Admission medications   Medication Sig Start Date End Date Taking? Authorizing Provider  atorvastatin (LIPITOR) 20  MG tablet Take 20 mg by mouth daily.    Historical Provider, MD  ciprofloxacin (CIPRO) 500 MG tablet Take 1 tablet (500 mg total) by mouth every 12 (twelve) hours. 11/07/16   Varney Biles, MD  FLUoxetine (PROZAC) 40 MG capsule Take 40 mg by mouth daily.      Historical Provider, MD  furosemide (LASIX) 20 MG tablet TAKE 1 TO 2 TABLETS BY MOUTH DAILY AS DIRECTED 10/18/15   Pixie Casino, MD  HYDROcodone-acetaminophen (NORCO) 10-325 MG per tablet Take 1 tablet by mouth every 6 (six) hours as needed.    Historical Provider, MD  HYDROcodone-acetaminophen (NORCO) 10-325 MG tablet Take one tablet every 4-6 hours prn foot pain. 08/18/16   Wallene Huh, DPM  HYDROcodone-acetaminophen (NORCO) 10-325 MG tablet Take 1 tablet by mouth every 4 (four) hours as needed.    Tamala Fothergill Regal, DPM  Inulin  (FIBER CHOICE PO) Take by mouth daily.    Historical Provider, MD  LINZESS 290 MCG CAPS capsule TAKE (1) CAPSULE BY MOUTH ONCE DAILY. 05/22/16   Carlis Stable, NP  lisinopril-hydrochlorothiazide (PRINZIDE,ZESTORETIC) 20-12.5 MG tablet TAKE (1) TABLET BY MOUTH DAILY FOR HIGH BLOOD PRESSURE. 02/04/16   Pixie Casino, MD  methimazole (TAPAZOLE) 5 MG tablet Take 5 mg by mouth daily.    Historical Provider, MD  oxybutynin (DITROPAN) 5 MG tablet Take 5 mg by mouth daily.    Historical Provider, MD  potassium chloride (K-DUR,KLOR-CON) 10 MEQ tablet Take 1 tablet (10 mEq total) by mouth daily. NEEDS APPOINTMENT FOR FUTURE REFILLS 05/22/16   Pixie Casino, MD  tolterodine (DETROL) 1 MG tablet Take 1 mg by mouth daily. Reported on 09/14/2015    Historical Provider, MD    Family History Family History  Problem Relation Age of Onset  . Scoliosis Mother     also kidney infections  . Osteoarthritis Mother   . Glaucoma Mother   . Lung cancer Father   . Hepatitis C Sister     also OA  . Breast cancer Maternal Grandmother     and lymph cancer  . Kidney cancer Maternal Grandfather   . ADD / ADHD Child   . ADD / ADHD Child   . Bipolar disorder Child   . Colon cancer Neg Hx     Social History Social History  Substance Use Topics  . Smoking status: Former Smoker    Packs/day: 0.00    Years: 35.00    Types: Cigarettes    Quit date: 07/03/2013  . Smokeless tobacco: Never Used     Comment: Quit x one month  . Alcohol use 0.0 oz/week     Comment: RARELY     Allergies   Ancef [cefazolin] and Sulfa antibiotics   Review of Systems Review of Systems  All other systems reviewed and are negative.    Physical Exam Updated Vital Signs BP (!) 154/88   Pulse 76   Temp 98.3 F (36.8 C) (Oral)   Resp 18   Ht 5' 4.5" (1.638 m)   Wt 168 lb (76.2 kg)   SpO2 99%   BMI 28.39 kg/m   Physical Exam  Constitutional: She is oriented to person, place, and time. She appears well-developed and  well-nourished.  HENT:  Head: Normocephalic and atraumatic.  Eyes: Conjunctivae and EOM are normal. Pupils are equal, round, and reactive to light.  Neck: Normal range of motion. Neck supple.  Cardiovascular: Normal rate, regular rhythm and normal heart sounds.   No murmur  heard. Pulmonary/Chest: Effort normal and breath sounds normal. No respiratory distress.  Abdominal: Soft. Bowel sounds are normal. She exhibits no distension. There is tenderness. There is no rebound and no guarding.  Lower quadrant tenderness  Musculoskeletal: She exhibits edema.  2+ edema bilaterally  Neurological: She is alert and oriented to person, place, and time.  Skin: Skin is warm and dry.     ED Treatments / Results  Labs (all labs ordered are listed, but only abnormal results are displayed) Labs Reviewed  BASIC METABOLIC PANEL - Abnormal; Notable for the following:       Result Value   Potassium 3.2 (*)    All other components within normal limits  URINALYSIS, ROUTINE W REFLEX MICROSCOPIC - Abnormal; Notable for the following:    APPearance HAZY (*)    Leukocytes, UA MODERATE (*)    Squamous Epithelial / LPF 6-30 (*)    All other components within normal limits  HEPATIC FUNCTION PANEL - Abnormal; Notable for the following:    Total Bilirubin 0.2 (*)    Indirect Bilirubin 0.1 (*)    All other components within normal limits  URINE CULTURE  CBC  BRAIN NATRIURETIC PEPTIDE  TROPONIN I    EKG  EKG Interpretation  Date/Time:  Monday November 06 2016 21:48:06 EDT Ventricular Rate:  68 PR Interval:  138 QRS Duration: 98 QT Interval:  422 QTC Calculation: 448 R Axis:   38 Text Interpretation:  Normal sinus rhythm Incomplete right bundle branch block Borderline ECG No STEMI.  Confirmed by LONG MD, JOSHUA [phone]) on 11/06/2016 9:52:16 PM       Radiology Dg Chest 2 View  Result Date: 11/06/2016 CLINICAL DATA:  Shortness of breath and cough EXAM: CHEST  2 VIEW COMPARISON:  02/03/2013 FINDINGS:  Cervical spine hardware. Hyperinflated lungs. No consolidation or effusion. Prominent interstitial opacities in the lower lung zones. Normal heart size. No pneumothorax. IMPRESSION: Hyperinflation. Prominent interstitial opacities at the bilateral lower lung zones suspicious for bronchial/interstitial inflammation. No focal pulmonary infiltrate is seen. Electronically Signed   By: Donavan Foil M.D.   On: 11/06/2016 22:09    Procedures Procedures (including critical care time)  Medications Ordered in ED Medications  ciprofloxacin (CIPRO) tablet 500 mg (not administered)     Initial Impression / Assessment and Plan / ED Course  I have reviewed the triage vital signs and the nursing notes.  Pertinent labs & imaging results that were available during my care of the patient were reviewed by me and considered in my medical decision making (see chart for details).    Pt comes in with cc of leg swelling. Appears to be more related to venous stasis than CHF. She is already taking double the lasix - I have asked her to start using compression stalking at day time and see her pcp.  Pt also has groin pain and lower quad abd pain - UA is +. We will tx for UTI. Based on exam, the abd tenderness is diffuse in the lower quadrants, and no peritoneal signs appreciated - so no CT indicated.  Strict ER return precautions have been discussed, and patient is agreeing with the plan and is comfortable with the workup done and the recommendations from the ER.       Final Clinical Impressions(s) / ED Diagnoses   Final diagnoses:  Acute cystitis without hematuria  Leg edema  Peripheral edema    New Prescriptions New Prescriptions   CIPROFLOXACIN (CIPRO) 500 MG TABLET    Take  1 tablet (500 mg total) by mouth every 12 (twelve) hours.     Varney Biles, MD 11/07/16 [phone]

## 2016-11-07 NOTE — Discharge Instructions (Signed)
You have a urinary infection - which is probably the cause for your pain. Start the antibiotics.  Also - see you primary doctor for optimal eval of the leg swelling.

## 2016-11-09 LAB — URINE CULTURE

## 2016-11-14 ENCOUNTER — Other Ambulatory Visit (HOSPITAL_COMMUNITY): Payer: Self-pay | Admitting: Internal Medicine

## 2016-11-14 ENCOUNTER — Ambulatory Visit (HOSPITAL_COMMUNITY)
Admission: RE | Admit: 2016-11-14 | Discharge: 2016-11-14 | Disposition: A | Discharge status: Home / Self Care | Payer: Medicare Other | Source: Ambulatory Visit | Attending: Internal Medicine | Admitting: Internal Medicine

## 2016-11-14 DIAGNOSIS — R600 Localized edema: Secondary | ICD-10-CM | POA: Insufficient documentation

## 2016-11-14 DIAGNOSIS — M79669 Pain in unspecified lower leg: Secondary | ICD-10-CM

## 2016-11-14 DIAGNOSIS — N390 Urinary tract infection, site not specified: Secondary | ICD-10-CM | POA: Diagnosis not present

## 2016-11-14 DIAGNOSIS — M25552 Pain in left hip: Secondary | ICD-10-CM | POA: Diagnosis not present

## 2016-11-14 DIAGNOSIS — M7989 Other specified soft tissue disorders: Secondary | ICD-10-CM

## 2016-11-14 DIAGNOSIS — Z6829 Body mass index (BMI) 29.0-29.9, adult: Secondary | ICD-10-CM | POA: Diagnosis not present

## 2016-11-14 DIAGNOSIS — E876 Hypokalemia: Secondary | ICD-10-CM | POA: Diagnosis not present

## 2016-11-14 DIAGNOSIS — L03119 Cellulitis of unspecified part of limb: Secondary | ICD-10-CM | POA: Diagnosis not present

## 2016-11-14 DIAGNOSIS — Z1389 Encounter for screening for other disorder: Secondary | ICD-10-CM | POA: Diagnosis not present

## 2016-11-14 DIAGNOSIS — M25551 Pain in right hip: Secondary | ICD-10-CM | POA: Diagnosis not present

## 2016-11-24 DIAGNOSIS — Z1389 Encounter for screening for other disorder: Secondary | ICD-10-CM | POA: Diagnosis not present

## 2016-11-24 DIAGNOSIS — R600 Localized edema: Secondary | ICD-10-CM | POA: Diagnosis not present

## 2016-11-24 DIAGNOSIS — L03119 Cellulitis of unspecified part of limb: Secondary | ICD-10-CM | POA: Diagnosis not present

## 2016-11-24 DIAGNOSIS — E876 Hypokalemia: Secondary | ICD-10-CM | POA: Diagnosis not present

## 2016-11-24 DIAGNOSIS — Z6826 Body mass index (BMI) 26.0-26.9, adult: Secondary | ICD-10-CM | POA: Diagnosis not present

## 2016-11-24 DIAGNOSIS — E663 Overweight: Secondary | ICD-10-CM | POA: Diagnosis not present

## 2016-11-24 DIAGNOSIS — I872 Venous insufficiency (chronic) (peripheral): Secondary | ICD-10-CM | POA: Diagnosis not present

## 2016-11-29 ENCOUNTER — Encounter: Payer: Self-pay | Admitting: Podiatry

## 2016-11-29 ENCOUNTER — Ambulatory Visit (INDEPENDENT_AMBULATORY_CARE_PROVIDER_SITE_OTHER): Payer: Medicare Other | Admitting: Podiatry

## 2016-11-29 DIAGNOSIS — Q828 Other specified congenital malformations of skin: Secondary | ICD-10-CM | POA: Diagnosis not present

## 2016-11-29 DIAGNOSIS — B351 Tinea unguium: Secondary | ICD-10-CM | POA: Diagnosis not present

## 2016-11-29 LAB — HEPATIC FUNCTION PANEL
ALBUMIN: 4.3 g/dL (ref 3.6–5.1)
ALK PHOS: 83 U/L (ref 33–130)
ALT: 12 U/L (ref 6–29)
AST: 13 U/L (ref 10–35)
BILIRUBIN TOTAL: 0.4 mg/dL (ref 0.2–1.2)
Bilirubin, Direct: 0.1 mg/dL (ref <=0.2)
Indirect Bilirubin: 0.3 mg/dL (ref 0.2–1.2)
Total Protein: 7.1 g/dL (ref 6.1–8.1)

## 2016-11-29 MED ORDER — TERBINAFINE HCL 250 MG PO TABS: 250.0000 mg | ORAL_TABLET | Freq: Every day | ORAL | 0 refills | Status: DC

## 2016-11-29 NOTE — Progress Notes (Signed)
Subjective:    Patient ID: Katelyn [name], female   DOB: 56 y.o.   MRN: [id number]   HPI patient presents stating the lesion seemed to feel much better and I do 1 at the tip of my toe    ROS      Objective:  Physical Exam Neurovascular status intact with patient found to have distal keratotic lesion distal third digit right and underneath the right foot that's painful when pressed with no other significant pathology    Assessment:   Continued digital deformities with porokeratotic type lesion that's improved from previous visit      Plan:    Debrided and applied a buttress pad to the right lesser digits to lift them up and debrided the lesion on the left digit

## 2016-12-14 DIAGNOSIS — E748 Other specified disorders of carbohydrate metabolism: Secondary | ICD-10-CM | POA: Diagnosis not present

## 2016-12-14 DIAGNOSIS — R7301 Impaired fasting glucose: Secondary | ICD-10-CM | POA: Diagnosis not present

## 2017-01-15 ENCOUNTER — Other Ambulatory Visit (HOSPITAL_COMMUNITY): Payer: Self-pay | Admitting: Internal Medicine

## 2017-01-15 DIAGNOSIS — I10 Essential (primary) hypertension: Secondary | ICD-10-CM | POA: Diagnosis not present

## 2017-01-15 DIAGNOSIS — Z1231 Encounter for screening mammogram for malignant neoplasm of breast: Secondary | ICD-10-CM

## 2017-01-15 DIAGNOSIS — K219 Gastro-esophageal reflux disease without esophagitis: Secondary | ICD-10-CM | POA: Diagnosis not present

## 2017-01-15 DIAGNOSIS — G894 Chronic pain syndrome: Secondary | ICD-10-CM | POA: Diagnosis not present

## 2017-01-15 DIAGNOSIS — Z0001 Encounter for general adult medical examination with abnormal findings: Secondary | ICD-10-CM | POA: Diagnosis not present

## 2017-01-15 DIAGNOSIS — Z6825 Body mass index (BMI) 25.0-25.9, adult: Secondary | ICD-10-CM | POA: Diagnosis not present

## 2017-01-16 ENCOUNTER — Other Ambulatory Visit (HOSPITAL_COMMUNITY): Payer: Self-pay | Admitting: Internal Medicine

## 2017-01-16 DIAGNOSIS — I739 Peripheral vascular disease, unspecified: Secondary | ICD-10-CM

## 2017-01-19 DIAGNOSIS — Z6825 Body mass index (BMI) 25.0-25.9, adult: Secondary | ICD-10-CM | POA: Diagnosis not present

## 2017-01-19 DIAGNOSIS — Z1389 Encounter for screening for other disorder: Secondary | ICD-10-CM | POA: Diagnosis not present

## 2017-01-19 DIAGNOSIS — E663 Overweight: Secondary | ICD-10-CM | POA: Diagnosis not present

## 2017-01-19 DIAGNOSIS — E785 Hyperlipidemia, unspecified: Secondary | ICD-10-CM | POA: Diagnosis not present

## 2017-01-22 ENCOUNTER — Other Ambulatory Visit (HOSPITAL_COMMUNITY): Payer: Self-pay | Admitting: Internal Medicine

## 2017-01-22 ENCOUNTER — Ambulatory Visit (HOSPITAL_COMMUNITY)
Admission: RE | Admit: 2017-01-22 | Discharge: 2017-01-22 | Disposition: A | Discharge status: Home / Self Care | Payer: Medicare Other | Source: Ambulatory Visit | Attending: Internal Medicine | Admitting: Internal Medicine

## 2017-01-22 DIAGNOSIS — M79661 Pain in right lower leg: Secondary | ICD-10-CM | POA: Diagnosis not present

## 2017-01-22 DIAGNOSIS — I739 Peripheral vascular disease, unspecified: Secondary | ICD-10-CM | POA: Diagnosis not present

## 2017-01-22 DIAGNOSIS — M79662 Pain in left lower leg: Secondary | ICD-10-CM | POA: Diagnosis not present

## 2017-02-05 ENCOUNTER — Other Ambulatory Visit: Payer: Self-pay | Admitting: Gastroenterology

## 2017-02-13 ENCOUNTER — Ambulatory Visit: Payer: Medicare Other

## 2017-02-13 ENCOUNTER — Other Ambulatory Visit: Payer: Self-pay

## 2017-02-13 DIAGNOSIS — I739 Peripheral vascular disease, unspecified: Secondary | ICD-10-CM

## 2017-02-23 DIAGNOSIS — M25552 Pain in left hip: Secondary | ICD-10-CM | POA: Diagnosis not present

## 2017-02-23 DIAGNOSIS — Z1389 Encounter for screening for other disorder: Secondary | ICD-10-CM | POA: Diagnosis not present

## 2017-02-23 DIAGNOSIS — E663 Overweight: Secondary | ICD-10-CM | POA: Diagnosis not present

## 2017-02-23 DIAGNOSIS — K219 Gastro-esophageal reflux disease without esophagitis: Secondary | ICD-10-CM | POA: Diagnosis not present

## 2017-02-23 DIAGNOSIS — Z6826 Body mass index (BMI) 26.0-26.9, adult: Secondary | ICD-10-CM | POA: Diagnosis not present

## 2017-02-23 DIAGNOSIS — E063 Autoimmune thyroiditis: Secondary | ICD-10-CM | POA: Diagnosis not present

## 2017-02-23 DIAGNOSIS — L720 Epidermal cyst: Secondary | ICD-10-CM | POA: Diagnosis not present

## 2017-03-06 ENCOUNTER — Encounter: Payer: Self-pay | Admitting: Vascular Surgery

## 2017-03-16 ENCOUNTER — Encounter: Payer: Self-pay | Admitting: Vascular Surgery

## 2017-03-16 ENCOUNTER — Ambulatory Visit (HOSPITAL_COMMUNITY)
Admission: RE | Admit: 2017-03-16 | Discharge: 2017-03-16 | Disposition: A | Discharge status: Home / Self Care | Payer: Medicare Other | Source: Ambulatory Visit | Attending: Vascular Surgery | Admitting: Vascular Surgery

## 2017-03-16 ENCOUNTER — Ambulatory Visit (INDEPENDENT_AMBULATORY_CARE_PROVIDER_SITE_OTHER): Payer: Medicare Other | Admitting: Vascular Surgery

## 2017-03-16 VITALS — BP 134/77 | HR 61 | Temp 98.4°F | Wt 156.7 lb

## 2017-03-16 DIAGNOSIS — R0989 Other specified symptoms and signs involving the circulatory and respiratory systems: Secondary | ICD-10-CM | POA: Diagnosis present

## 2017-03-16 DIAGNOSIS — I771 Stricture of artery: Secondary | ICD-10-CM | POA: Insufficient documentation

## 2017-03-16 DIAGNOSIS — I739 Peripheral vascular disease, unspecified: Secondary | ICD-10-CM

## 2017-03-16 DIAGNOSIS — M79604 Pain in right leg: Secondary | ICD-10-CM

## 2017-03-16 DIAGNOSIS — I70211 Atherosclerosis of native arteries of extremities with intermittent claudication, right leg: Secondary | ICD-10-CM | POA: Diagnosis not present

## 2017-03-16 DIAGNOSIS — M79605 Pain in left leg: Secondary | ICD-10-CM

## 2017-03-16 NOTE — Progress Notes (Signed)
Patient ID: Fendi Meinhardt, female   DOB: 24-Oct-1960, 56 y.o.   MRN: [id number]  Reason for Consult: Claudication (bilateral x 1.5 yrs)   Referred by Redmond School, MD  Subjective:     HPI:  Gianah Batt is a 57 y.o. female with history of bilateral lower extremity pain. She has had cramping in her right lower extremity both with and without walking for some time. She also has had multiple foot surgeries which causes her pain in her right lower extremity. She does have pain at rest from that standpoint but it does not appear from a vascular standpoint. She is a long-time smoker but did quit recently in January of this year. Her left leg pain is in her groin and is not worsened with exercise or rest. She states that this is actually exacerbated with urination and some positional changes. She does have hyperlipidemia for which she takes a statin drug. She does not take any aspirin at this time. She is a nondiabetic and does not have high blood pressure. She does not have rest pain or tissue loss.  Past Medical History:  Diagnosis Date  . Degenerative disc disease, lumbar   . Depression   . GERD (gastroesophageal reflux disease)   . Goiter   . Hemorrhoids   . Hepatitis   . Hepatitis C infection 2004   cured with treatment of interferon and ribavirin  . Hypercholesteremia   . Hypertension   . Knee pain   . Panic attack   . PONV (postoperative nausea and vomiting)   . Tubular adenoma 10/14/12  . Varicose veins of both lower extremities with complications    Family History  Problem Relation Age of Onset  . Scoliosis Mother        also kidney infections  . Osteoarthritis Mother   . Glaucoma Mother   . Lung cancer Father   . Hepatitis C Sister        also OA  . Breast cancer Maternal Grandmother        and lymph cancer  . Kidney cancer Maternal Grandfather   . ADD / ADHD Child   . ADD / ADHD Child   . Bipolar disorder Child   . Colon cancer Neg Hx    Past Surgical  History:  Procedure Laterality Date  . BACK SURGERY    . BACK SURGERY  2015  . CARDIOPULMONARY EXERCISE TEST  08/26/2012   RER 0.94, Peak VO2 87%, HR 69% predicted, normal VO2 curves  . COLONOSCOPY N/A 10/14/2012   Dr.Rourk-  normal rectum, 3 polyps in the cecum- the largest being approximately 5 mm in diameter. the remainder of the colonic mucosa appeared normal. bx= tubular adenoma  . FOOT SURGERY Right 2012  . FOOT SURGERY Right 2018  . HEMORRHOID BANDING    . NECK SURGERY  2005  . TONSILLECTOMY  1970   and adenoidectomy  . TOTAL SHOULDER ARTHROPLASTY  2011  . TRANSTHORACIC ECHOCARDIOGRAM  07/2012   EF 55-60%, mod LVH, mod conc LVH, RVSP increased (mild pulm HTN)  . TUBAL LIGATION  1988    Short Social History:  Social History  Substance Use Topics  . Smoking status: Former Smoker    Packs/day: 0.00    Years: 35.00    Types: Cigarettes    Quit date: 07/03/2013  . Smokeless tobacco: Never Used     Comment: Quit x one month  . Alcohol use 0.0 oz/week     Comment: RARELY    Allergies  Allergen Reactions  . Ancef [Cefazolin] Swelling    Pt had vague constriction of throat post op. Not sure it was abx. But could be so proceed cautiously with cephalosporins   . Sulfa Antibiotics Nausea And Vomiting and Rash    Current Outpatient Prescriptions  Medication Sig Dispense Refill  . atorvastatin (LIPITOR) 20 MG tablet Take 20 mg by mouth daily.    Marland Kitchen FLUoxetine (PROZAC) 40 MG capsule Take 40 mg by mouth daily.      . furosemide (LASIX) 20 MG tablet TAKE 1 TO 2 TABLETS BY MOUTH DAILY AS DIRECTED (Patient taking differently: TAKE 1 TO 2 TABLETS BY MOUTH DAILY AS DIRECTED; pt stated, "Takes 60 mg - 11/29/16") 45 tablet 3  . Inulin (FIBER CHOICE PO) Take by mouth daily.    Marland Kitchen LINZESS 290 MCG CAPS capsule TAKE (1) CAPSULE BY MOUTH ONCE DAILY. 30 capsule 5  . lisinopril-hydrochlorothiazide (PRINZIDE,ZESTORETIC) 20-12.5 MG tablet TAKE (1) TABLET BY MOUTH DAILY FOR HIGH BLOOD PRESSURE. 15  tablet 0  . methimazole (TAPAZOLE) 5 MG tablet Take 5 mg by mouth daily.    Marland Kitchen oxybutynin (DITROPAN) 5 MG tablet Take 5 mg by mouth daily.    . potassium chloride (K-DUR,KLOR-CON) 10 MEQ tablet Take 1 tablet (10 mEq total) by mouth daily. NEEDS APPOINTMENT FOR FUTURE REFILLS 15 tablet 0  . terbinafine (LAMISIL) 250 MG tablet Take 1 tablet (250 mg total) by mouth daily. 90 tablet 0  . tolterodine (DETROL) 1 MG tablet Take 1 mg by mouth daily. Reported on 09/14/2015     No current facility-administered medications for this visit.     Review of Systems  Constitutional:  Constitutional negative. Respiratory: Positive for shortness of breath.  Cardiovascular: Positive for dyspnea with exertion.  Musculoskeletal: Positive for leg pain.  Skin: Skin negative.  Neurological: Positive for focal weakness.  Hematologic: Hematologic/lymphatic negative.  Psychiatric: Psychiatric negative.        Objective:  Objective   Vitals:   03/16/17 0943  BP: 134/77  Pulse: 61  Temp: 98.4 F (36.9 C)  TempSrc: Oral  SpO2: 98%  Weight: 156 lb 11.2 oz (71.1 kg)   Body mass index is 26.48 kg/m.  Physical Exam  Constitutional: She is oriented to person, place, and time. She appears well-developed.  HENT:  Head: Normocephalic.  Neck: Normal range of motion.  Cardiovascular: Normal rate.   Pulses:      Carotid pulses are 2+ on the right side, and 2+ on the left side.      Femoral pulses are 2+ on the right side.      Dorsalis pedis pulses are 0 on the right side, and 1+ on the left side.       Posterior tibial pulses are 0 on the right side.  Monophasic right dp signal  Pulmonary/Chest: Effort normal.  Abdominal: Soft. She exhibits no mass.  Musculoskeletal: Normal range of motion. She exhibits no edema.  Neurological: She is alert and oriented to person, place, and time.  Skin: Skin is warm and dry.  Psychiatric: She has a normal mood and affect. Her behavior is normal. Judgment and thought  content normal.    Data: I've independently interpreted her bilateral lower extremity arterial evaluation. ABI on the right is 0.7 and on the left 0.94. There is an apparent occlusion of the right side and SFA at the origin with reconstitution proximally.      Assessment/Plan:     56 year old female with what appears to be multifactorial pain  in her right lower extremity. She does have an occluded right SFA with very short distance walking cramping in her leg and pain. I discussed with her that I'm not sure any vascular intervention will help her but given the extreme nature of her right lower pain it may be worth a try. I have also discussed with her the transcend trial whereby she would have 1 of 2 drug coated balloons used to treat her SFA occlusion if this in fact appears to be a problem after diagnostic angiogram. She is willing to proceed with this. I have asked her to start taking 81 mg of aspirin and if we do intervene she will likely need Plavix for at least 3 months. We discussed risks benefits and alternatives including continued watchful waiting at this time she was to proceed and all questions were answered.     Waynetta Sandy MD [facility]

## 2017-03-21 ENCOUNTER — Ambulatory Visit (INDEPENDENT_AMBULATORY_CARE_PROVIDER_SITE_OTHER): Payer: Medicare Other | Admitting: Orthopedic Surgery

## 2017-03-21 ENCOUNTER — Encounter: Payer: Self-pay | Admitting: Orthopedic Surgery

## 2017-03-21 ENCOUNTER — Other Ambulatory Visit: Payer: Self-pay

## 2017-03-21 VITALS — BP 121/72 | HR 61 | Ht 65.0 in | Wt 155.0 lb

## 2017-03-21 DIAGNOSIS — S73192A Other sprain of left hip, initial encounter: Secondary | ICD-10-CM | POA: Diagnosis not present

## 2017-03-21 DIAGNOSIS — I70211 Atherosclerosis of native arteries of extremities with intermittent claudication, right leg: Secondary | ICD-10-CM

## 2017-03-21 NOTE — Progress Notes (Signed)
NEW PATIENT OFFICE VISIT    Chief Complaint  Patient presents with  . Hip Pain    Left hip pain, no injury.    This is a 56 year old female status post lumbar fusion presents with a six-month history of pain in her left groin radiating down the front of her left leg. She feels a dull moderately severe constant pain in this area. She did have an x-ray of her hip which was read as negative for fracture dislocation or osteoarthritis  Says that nothing that she has tried has relieved it including ice and heat  She also states that when she voids urine or has a bowel movement or coughs the pain intensifies  She was diagnosed with a blockage arterial right groin to be ballooned on Wednesday    Review of Systems  Gastrointestinal: Negative.   Genitourinary: Negative.   Musculoskeletal: Positive for back pain and joint pain.  Neurological: Negative for tingling and sensory change.    Past Medical History:  Diagnosis Date  . Degenerative disc disease, lumbar   . Depression   . GERD (gastroesophageal reflux disease)   . Goiter   . Hemorrhoids   . Hepatitis   . Hepatitis C infection 2004   cured with treatment of interferon and ribavirin  . Hypercholesteremia   . Hypertension   . Knee pain   . Panic attack   . PONV (postoperative nausea and vomiting)   . Tubular adenoma 10/14/12  . Varicose veins of both lower extremities with complications     Past Surgical History:  Procedure Laterality Date  . BACK SURGERY    . BACK SURGERY  2015  . CARDIOPULMONARY EXERCISE TEST  08/26/2012   RER 0.94, Peak VO2 87%, HR 69% predicted, normal VO2 curves  . COLONOSCOPY N/A 10/14/2012   Dr.Rourk-  normal rectum, 3 polyps in the cecum- the largest being approximately 5 mm in diameter. the remainder of the colonic mucosa appeared normal. bx= tubular adenoma  . FOOT SURGERY Right 2012  . FOOT SURGERY Right 2018  . HEMORRHOID BANDING    . NECK SURGERY  2005  . TONSILLECTOMY  1970   and  adenoidectomy  . TOTAL SHOULDER ARTHROPLASTY  2011  . TRANSTHORACIC ECHOCARDIOGRAM  07/2012   EF 55-60%, mod LVH, mod conc LVH, RVSP increased (mild pulm HTN)  . TUBAL LIGATION  1988    Family History  Problem Relation Age of Onset  . Scoliosis Mother        also kidney infections  . Osteoarthritis Mother   . Glaucoma Mother   . Lung cancer Father   . Hepatitis C Sister        also OA  . Breast cancer Maternal Grandmother        and lymph cancer  . Kidney cancer Maternal Grandfather   . ADD / ADHD Child   . ADD / ADHD Child   . Bipolar disorder Child   . Colon cancer Neg Hx    Social History  Substance Use Topics  . Smoking status: Former Smoker    Packs/day: 0.00    Years: 35.00    Types: Cigarettes    Quit date: 07/03/2013  . Smokeless tobacco: Never Used     Comment: Quit x one month  . Alcohol use 0.0 oz/week     Comment: RARELY    BP 121/72   Pulse 61   Ht 5\' 5"  (1.651 m)   Wt 155 lb (70.3 kg)   BMI 25.79 kg/m  Physical Exam  Constitutional: She is oriented to person, place, and time. She appears well-developed and well-nourished. No distress.  Cardiovascular: Normal rate and intact distal pulses.   Neurological: She is alert and oriented to person, place, and time. She has normal reflexes. She exhibits normal muscle tone. Coordination normal.  Skin: Skin is warm and dry. No rash noted. She is not diaphoretic. No erythema. No pallor.  Psychiatric: She has a normal mood and affect. Her behavior is normal. Judgment and thought content normal.    Left Hip Exam   Range of Motion  Normal left hip ROM  Muscle Strength  Normal left hip strength  Right Hip Exam   Tenderness  None  Range of Motion  Normal right hip ROM  Muscle Strength  Normal right hip strength  Comments:  Neurovascular exam appears to be intact, foot is warm color is normal capillary refill is good faint dorsalis pedis pulses palpable     Encounter Diagnosis  Name Primary?   . Tear of left acetabular labrum, initial encounter Yes     PLAN:   Recommend MRI with arthrogram for labral tear left hip

## 2017-03-27 ENCOUNTER — Encounter: Payer: Self-pay | Admitting: Internal Medicine

## 2017-03-28 ENCOUNTER — Encounter: Payer: Self-pay | Admitting: *Deleted

## 2017-03-28 ENCOUNTER — Encounter (HOSPITAL_COMMUNITY)
Admission: RE | Disposition: A | Discharge status: Home / Self Care | Payer: Self-pay | Source: Ambulatory Visit | Attending: Vascular Surgery

## 2017-03-28 ENCOUNTER — Encounter (HOSPITAL_COMMUNITY): Payer: Self-pay | Admitting: Vascular Surgery

## 2017-03-28 ENCOUNTER — Ambulatory Visit (HOSPITAL_COMMUNITY)
Admission: RE | Admit: 2017-03-28 | Discharge: 2017-03-28 | Disposition: A | Discharge status: Home / Self Care | Payer: Medicare Other | Source: Ambulatory Visit | Attending: Vascular Surgery | Admitting: Vascular Surgery

## 2017-03-28 ENCOUNTER — Telehealth: Payer: Self-pay | Admitting: Vascular Surgery

## 2017-03-28 DIAGNOSIS — I739 Peripheral vascular disease, unspecified: Secondary | ICD-10-CM | POA: Diagnosis present

## 2017-03-28 DIAGNOSIS — I70211 Atherosclerosis of native arteries of extremities with intermittent claudication, right leg: Secondary | ICD-10-CM | POA: Diagnosis not present

## 2017-03-28 DIAGNOSIS — Z006 Encounter for examination for normal comparison and control in clinical research program: Secondary | ICD-10-CM

## 2017-03-28 HISTORY — PX: ABDOMINAL AORTOGRAM W/LOWER EXTREMITY: CATH118223

## 2017-03-28 LAB — COMPREHENSIVE METABOLIC PANEL
ALK PHOS: 77 U/L (ref 38–126)
ALT: 21 U/L (ref 14–54)
AST: 20 U/L (ref 15–41)
Albumin: 3.6 g/dL (ref 3.5–5.0)
Anion gap: 7 (ref 5–15)
BUN: 10 mg/dL (ref 6–20)
CALCIUM: 8.9 mg/dL (ref 8.9–10.3)
CO2: 28 mmol/L (ref 22–32)
CREATININE: 0.56 mg/dL (ref 0.44–1.00)
Chloride: 101 mmol/L (ref 101–111)
Glucose, Bld: 100 mg/dL — ABNORMAL HIGH (ref 65–99)
Potassium: 3.7 mmol/L (ref 3.5–5.1)
Sodium: 136 mmol/L (ref 135–145)
Total Bilirubin: 0.2 mg/dL — ABNORMAL LOW (ref 0.3–1.2)
Total Protein: 6.9 g/dL (ref 6.5–8.1)

## 2017-03-28 LAB — CBC
HCT: 35.2 % — ABNORMAL LOW (ref 36.0–46.0)
Hemoglobin: 11.7 g/dL — ABNORMAL LOW (ref 12.0–15.0)
MCH: 30.6 pg (ref 26.0–34.0)
MCHC: 33.2 g/dL (ref 30.0–36.0)
MCV: 92.1 fL (ref 78.0–100.0)
PLATELETS: 248 10*3/uL (ref 150–400)
RBC: 3.82 MIL/uL — AB (ref 3.87–5.11)
RDW: 13.1 % (ref 11.5–15.5)
WBC: 5.6 10*3/uL (ref 4.0–10.5)

## 2017-03-28 SURGERY — ABDOMINAL AORTOGRAM W/LOWER EXTREMITY
Anesthesia: LOCAL

## 2017-03-28 MED ORDER — SODIUM CHLORIDE 0.9% FLUSH: 3.0000 mL | Freq: Two times a day (BID) | INTRAVENOUS | Status: DC

## 2017-03-28 MED ORDER — MIDAZOLAM HCL 2 MG/2ML IJ SOLN
INTRAMUSCULAR | Status: AC
  Filled 2017-03-28: qty 2

## 2017-03-28 MED ORDER — HEPARIN (PORCINE) IN NACL 2-0.9 UNIT/ML-% IJ SOLN
INTRAMUSCULAR | Status: AC | PRN
  Administered 2017-03-28: 1000 mL via INTRA_ARTERIAL

## 2017-03-28 MED ORDER — HYDRALAZINE HCL 20 MG/ML IJ SOLN: 5.0000 mg | INTRAMUSCULAR | Status: DC | PRN

## 2017-03-28 MED ORDER — LIDOCAINE HCL (PF) 1 % IJ SOLN
INTRAMUSCULAR | Status: DC | PRN
  Administered 2017-03-28: 10 mL

## 2017-03-28 MED ORDER — ASPIRIN 81 MG PO CHEW
CHEWABLE_TABLET | ORAL | Status: AC
  Administered 2017-03-28: 81 mg via ORAL
  Filled 2017-03-28: qty 1

## 2017-03-28 MED ORDER — SODIUM CHLORIDE 0.9 % WEIGHT BASED INFUSION: 1.0000 mL/kg/h | INTRAVENOUS | Status: DC

## 2017-03-28 MED ORDER — SODIUM CHLORIDE 0.9 % IV SOLN: 250.0000 mL | INTRAVENOUS | Status: DC | PRN

## 2017-03-28 MED ORDER — LABETALOL HCL 5 MG/ML IV SOLN: 10.0000 mg | INTRAVENOUS | Status: DC | PRN

## 2017-03-28 MED ORDER — HEPARIN (PORCINE) IN NACL 2-0.9 UNIT/ML-% IJ SOLN
INTRAMUSCULAR | Status: AC
  Filled 2017-03-28: qty 1000

## 2017-03-28 MED ORDER — SODIUM CHLORIDE 0.9 % IV SOLN
INTRAVENOUS | Status: DC
  Administered 2017-03-28: 07:00:00 via INTRAVENOUS

## 2017-03-28 MED ORDER — FENTANYL CITRATE (PF) 100 MCG/2ML IJ SOLN
INTRAMUSCULAR | Status: AC
Start: 2017-03-28 — End: 2017-03-28
  Filled 2017-03-28: qty 2

## 2017-03-28 MED ORDER — FENTANYL CITRATE (PF) 100 MCG/2ML IJ SOLN
INTRAMUSCULAR | Status: DC | PRN
  Administered 2017-03-28: 25 ug via INTRAVENOUS

## 2017-03-28 MED ORDER — MIDAZOLAM HCL 2 MG/2ML IJ SOLN
INTRAMUSCULAR | Status: DC | PRN
  Administered 2017-03-28: 1 mg via INTRAVENOUS

## 2017-03-28 MED ORDER — LIDOCAINE HCL (PF) 1 % IJ SOLN
INTRAMUSCULAR | Status: AC
  Filled 2017-03-28: qty 30

## 2017-03-28 MED ORDER — ASPIRIN 81 MG PO CHEW
81.0000 mg | CHEWABLE_TABLET | Freq: Once | ORAL | Status: AC
  Administered 2017-03-28: 81 mg via ORAL

## 2017-03-28 MED ORDER — IODIXANOL 320 MG/ML IV SOLN
INTRAVENOUS | Status: DC | PRN
  Administered 2017-03-28: 117 mL via INTRAVENOUS

## 2017-03-28 MED ORDER — SODIUM CHLORIDE 0.9% FLUSH: 3.0000 mL | INTRAVENOUS | Status: DC | PRN

## 2017-03-28 SURGICAL SUPPLY — 10 items
CATH OMNI FLUSH 5F 65CM (CATHETERS) ×1 IMPLANT
COVER PRB 48X5XTLSCP FOLD TPE (BAG) IMPLANT
COVER PROBE 5X48 (BAG) ×2
KIT MICROINTRODUCER STIFF 5F (SHEATH) ×1 IMPLANT
KIT PV (KITS) ×2 IMPLANT
SHEATH PINNACLE 5F 10CM (SHEATH) ×1 IMPLANT
SYR MEDRAD MARK V 150ML (SYRINGE) ×2 IMPLANT
TRANSDUCER W/STOPCOCK (MISCELLANEOUS) ×2 IMPLANT
TRAY PV CATH (CUSTOM PROCEDURE TRAY) ×2 IMPLANT
WIRE BENTSON .035X145CM (WIRE) ×1 IMPLANT

## 2017-03-28 NOTE — Telephone Encounter (Signed)
Sched appt 06/29/17; labs at 11:00 and MD at 12:00. Mailed appt letter.

## 2017-03-28 NOTE — Op Note (Signed)
    Patient name: Katelyn [name] MRN: [id number] DOB: 1961/06/11 Sex: female  03/28/2017 Pre-operative Diagnosis: right leg claudication Post-operative diagnosis:  Same Surgeon:  Erlene Quan C. Donzetta Matters, MD Procedure Performed: 1.  US guided cannulation of left common femoral artery 2.  Aortogram with bilateral lower extremity runoff 3.  Moderate sedation with fentanyl and versed for 21 minutes   Indications:  56 year old female with history of right lower extremity claudication and pain in her bilateral lower extremity. She has an SFA disease and is indicated for possible intervention.  Findings: The aortoiliac segments are free of disease that is flow-limiting. Right SFA is occluded at its origin and reconstitutes midthigh via profunda collateralization. There is three-vessel runoff to the feet bilaterally. There is no discernible flow limiting disease on the left side.   Procedure:  The patient was identified in the holding area and taken to room 8.  The patient was then placed supine on the table and prepped and draped in the usual sterile fashion.  A time out was called.  Ultrasound was used to evaluate the left common femoral artery.  It was patent .  A digital ultrasound image was acquired.  A micropuncture needle was used to access the left common femoral artery under ultrasound guidance.  An 018 wire was advanced without resistance and a micropuncture sheath was placed.  The 018 wire was removed and a benson wire was placed.  The micropuncture sheath was exchanged for a 5 french sheath.  An omniflush catheter was advanced over the wire to the level of L-1.  An abdominal angiogram was obtained followed by bilateral lower extremity runoff..  Next, using the omniflush catheter and a benson wire, the aortic bifurcation was crossed and the catheter was placed into the right external iliac artery and right common femoral angiogram were obtained in RAO and LAO projections which demonstrated occlusion of the  SFA at its origin. No intervention could be undertaken and if patient is to have intervention will need to be femoral to above-knee pop tib bypass on the right. With this we placed a wire through the Omni flush catheter and removed it. Sheath was flushed and removed and postoperative holding. Patient tolerated procedure well without immediate complication.  Contrast: 117cc  Brandon C. Donzetta Matters, MD [facility] Office: [phone] Pager: [phone]

## 2017-03-28 NOTE — Telephone Encounter (Signed)
-----   Message from Mena Goes, RN sent at 03/28/2017  3:45 PM EDT ----- Regarding: 3 months   ----- Message ----- From: Waynetta Sandy, MD Sent: 03/28/2017   3:16 PM To: [facility] [address]  Katelyn [name] [id number] [birth year]  03/28/2017 Pre-operative Diagnosis: right leg claudication  Surgeon:  Erlene Quan C. Donzetta Matters, MD  Procedure Performed: 1.  US guided cannulation of left common femoral artery 2.  Aortogram with bilateral lower extremity runoff 3.  Moderate sedation with fentanyl and versed for 21 minutes  F/u in 3 months with ABI and bilateral gsv mapping for possible bypass surgery

## 2017-03-28 NOTE — H&P (Signed)
   History and Physical Update  The patient was interviewed and re-examined.  The patient's previous History and Physical has been reviewed and is unchanged from recent office visit. Plan for aortogram with bilateral runoff and possible intervention on right.   Shaylene Paganelli C. Donzetta Matters, MD [facility] Office: [phone] Pager: [phone]   03/28/2017, 8:07 AM

## 2017-03-28 NOTE — Discharge Instructions (Signed)

## 2017-03-28 NOTE — Research (Signed)
TRANSCEND Informed Consent   Subject Name: Katelyn [name]  Subject met inclusion and exclusion criteria.  The informed consent form, study requirements and expectations were reviewed with the subject and questions and concerns were addressed prior to the signing of the consent form.  The subject verbalized understanding of the trail requirements.  The subject agreed to participate in the TRANSCEND trial and signed the informed consent.  The informed consent was obtained prior to performance of any protocol-specific procedures for the subject.  A copy of the signed informed consent was given to the subject and a copy was placed in the subject's medical record. Only applicable if randomized.   Philemon Kingdom D 03/28/2017, 0610 AM

## 2017-03-28 NOTE — Progress Notes (Signed)
Site area: Insurance underwriter Prior to Removal:  Level 0 Pressure Applied For: 60min Manual:   yes Patient Status During Pull:  A/O Post Pull Site:  Level 0 Post Pull Instructions Given:  Yes and pt understands Post Pull Pulses Present: 2+ lt dp Dressing Applied:  Tegaderm and a 4x4 Bedrest begins @ 09:50:00  Pt leaves cath lab h olding in stable condition. Lt groin is unremarkable. Dressing is CDI.

## 2017-03-30 ENCOUNTER — Ambulatory Visit (HOSPITAL_COMMUNITY)
Admission: RE | Admit: 2017-03-30 | Discharge: 2017-03-30 | Disposition: A | Discharge status: Home / Self Care | Payer: Medicare Other | Source: Ambulatory Visit | Attending: Orthopedic Surgery | Admitting: Orthopedic Surgery

## 2017-03-30 ENCOUNTER — Other Ambulatory Visit: Payer: Self-pay | Admitting: Orthopedic Surgery

## 2017-03-30 DIAGNOSIS — S73192A Other sprain of left hip, initial encounter: Secondary | ICD-10-CM | POA: Insufficient documentation

## 2017-03-30 DIAGNOSIS — M25552 Pain in left hip: Secondary | ICD-10-CM | POA: Insufficient documentation

## 2017-03-30 DIAGNOSIS — X58XXXA Exposure to other specified factors, initial encounter: Secondary | ICD-10-CM | POA: Insufficient documentation

## 2017-03-30 MED ORDER — POVIDONE-IODINE 10 % EX SOLN
CUTANEOUS | Status: AC
  Administered 2017-03-30: 1
  Filled 2017-03-30: qty 15

## 2017-03-30 MED ORDER — LIDOCAINE HCL (PF) 2 % IJ SOLN
INTRAMUSCULAR | Status: AC
Start: 2017-03-30 — End: 2017-03-30
  Administered 2017-03-30: 10 mL
  Filled 2017-03-30: qty 10

## 2017-03-30 MED ORDER — IOPAMIDOL (ISOVUE-M 200) INJECTION 41%
INTRAMUSCULAR | Status: AC
Start: 2017-03-30 — End: 2017-03-30
  Administered 2017-03-30: 15 mL
  Filled 2017-03-30: qty 10

## 2017-03-30 MED ORDER — GADOBENATE DIMEGLUMINE 529 MG/ML IV SOLN
5.0000 mL | Freq: Once | INTRAVENOUS | Status: AC | PRN
  Administered 2017-03-30: 0.05 mL via INTRA_ARTICULAR

## 2017-03-30 NOTE — Procedures (Signed)
Preprocedure Dx: LEFT hip pain suspected labral tear Postprocedure Dx: LEFT hip pain suspected labral tear Procedure  Fluoroscopically guided LEFT joint injection for MR arthrogrpahy Radiologist:  Thornton Papas Anesthesia:  4 ml ml of 2% lidocaine Injectate:  9 ml of a solution of [0.5 ml Multihance + 15 ml Isovue-180 + 5 ml 2% lidocaine] Fluoro time:  0 minutes 30 seconds EBL:   None Complications: None

## 2017-04-04 NOTE — Progress Notes (Signed)
Screening/Baseline: ?    6-Minute Walk Test    Date: 03/28/2017 Time:  0612   SOB before test ?0 ?0.5 ?1 ?2 ?3 ?4 ?5 ?6 ?7 ?8 ?9 ?10  Fatigue before test ?0 ?0.5 ?1 ?2 ?3 ?4 ?5 ?6 ?7 ?8 ?9 ?10  Time Stopped: 0618 Distance Walked:  1140   SOB after test ?0 ?0.5 ?1 ?2 ?3 ?4 ?5 ?6 ?7 ?8 ?9 ?10  Fatigue after test ?0 ?0.5 ?1 ?2 ?3 ?4 ?5 ?6 ?7 ?8 ?9 ?10    Coordinator Signature: Philemon Kingdom :)    Date: 03/28/17  THE PERIPHERAL ARTERIAL QUESITONNAIRE The following questions refer to blockages in the arteries of your body, particularly your legs, and how that might affect your life. Please read and complete the following questions. There are no right or wrong answers. Please mark the answer that best applies to you.  1. Blockages in the arteries, often referred to as peripheral vascular disease, affect different people in different ways. Some feel cramping or aching while others feel fatigue. Which leg (or buttock) causes you the most severe discomfort, fatigue, pain, aching, or cramps? The RIGHT leg (buttock) the LEFT leg (buttocks) Both are the same Neither  ?    ?    ?       ?  2. Please review the list below and indicate how much limitation you have due to your peripheral vascular disease (discomfort, fatigue, pain, aching, or cramps in your calves (or buttocks)) over the past 4 weeks.   Place an X in one box on each line  Activity Extremely Limited Quite a bit Limited Moderately Limited Slightly Limited Not at all  Limited Limited for other reasons or did not do the activity.  Walking around your home ? ? ? ? ? ?  Walking 1-2 blocks on level ground ? ? ? ? ? ?  Walking 1-2 blocks up a hill ? ? ? ? ? ?  Walking 3-4 blocks on level ground ? ? ? ? ? ?  Hurrying or jogging (as if to catch a bus) ? ? ? ? ? ?  Vigorous work or exercise ? ? ? ? ? ?   3. Compared with 4 weeks ago, have your symptoms of peripheral vascular disease (discomfort, fatigue, pain, aching, or cramps in your  calves (or buttocks) changed? My symptoms have become.  Much Worse Slightly worse Not changed Slightly better Much Better I have had no symptoms over the past 4 weeks  ? ? ? ? ? ?         4. Over the past 4 weeks, how many times did you have discomfort, fatigue, pain, aching, or cramps in your calves (or buttocks)? All of the time Several times per day At least once a day 3 or more times per weeks but not every day 1-2 times per week Less than once a week Never over the past 4 weeks  ? ? ? ? ? ? ?     5. Over the past 4 weeks, how many times did you have discomfort, fatigue, pain, aching, or cramps in your calves (or buttocks) bothered you?  It has been.Extremely bothersome Moderately bothersome Somewhat bothersome Slightly bothersome Not at all bothersome I've had no leg discomfort  ? ? ? ? ? ?   6. Over the past 4 weeks, how often have you been awakened with pain, aching, or cramps in your legs or feet? Every night 3 or more times  per week but not every night 1-2 times per week Less than once a week  Never over the past 4 weeks   ? ? ? ? ?  7. How satisfied are you that everything possible is being done to treat your peripheral vascular disease? Not satisfied at all  Mostly dissatisfied Somewhat satisfied Mostly satisfied Completely satisfied  ? ?  ?  ?  ?    8. How satisfied are you with the explanations your doctor has given you about your peripheral vascular disease? Not satisfied at all  Mostly dissatisfied Somewhat satisfied Mostly satisfied Completely satisfied  ? ?  ?  ?  ?    9. Overall, how satisfied are you with the current treatment of your peripheral vascular disease? Not satisfied at all  Mostly dissatisfied Somewhat satisfied Mostly satisfied Completely satisfied  ? ?  ?  ?  ?    10. Over the past 4 weeks, how much has your peripheral vascular disease limited your enjoyment of life? It has extremely limited my enjoyment of life It has limited my enjoyment quite a bit  It  has moderately limited my enjoyment of life It has slightly limited my enjoyment of life It has not limited my enjoyment of life at all   ? ? ? ? ?   36. If you had to spend the rest of your life with your peripheral vascular disease the way it is right now, how would you feel about this? Not satisfied at all  Mostly dissatisfied Somewhat satisfied Mostly satisfied Completely satisfied  ? ?  ?  ?  ?    12. Over the past 4 weeks, how often have you felt discouraged or down in the dumps because of your peripheral vascular disease? I felt that way all of the time I felt that way most of the time I occasionally felt that way  I rarely felt that way I never felt that way  ? ? ? ? ?   13. How much does your peripheral vascular disease affect your lifestyle? Please indicate how your discomfort, fatigue, pain, aching, or cramps in your calves (or buttocks) may have limited your participation in the following activities over the past 4 weeks.   Please place an X in one box on each line Activity Severely limited Limited quite a bit Moderately limited Slightly limited Did not limit at all Does not apply or did not do for other reasons  Hobbies, recreational activities ? ? ? ? ? ?  Visiting family or friends out of your home ? ? ? ? ? ?  Working or doing household chores ? ? ? ? ? ?   Coordinator Signature: Philemon Kingdom :)    Date: 03/28/2017  RUTHERFORD CLASSIFICATION:  ? 0:  Asymptomatic, no hemodynamically significant occlusive disease ? 1:  Mild Claudication ? 2. Moderate Claudication ? 3. Severe Claudication ? 4. Ischemic rest pain ? 5. Minor tissue loss, non-healing ulcer, or focal gangrene with diffuse pedal ischemia ? 6. Major tissue loss, extending above trans-metatarsal level, functional foot no longer salvageable.   [facility] Clinical Symptom Classification:  ? Asymptomatic ? Mild Claudication/limb symptoms (no limit walking) ? Moderate claudication/limb symptoms (able to walk without  stopping >2 blocks or 200 meters or 4 minutes). ? Severe claudication/limb symptoms (only able to walk without stopping <2 blocks or 200 meters or 28minutes).  ? Ischemic rest pain (pain in the distal limb at rest, felt to be due to limited arterial perfusion). ?  Ischemic ulcers on distal leg ? Ischemic gangrene  PARC Calcification Class: No blockage ? Focal: <180? (1 side of vessel) and less than one-half of the total lesion length. ? Mild:  <180? and greater than one-half of the total lesion length. ? Moderate: ?180? (both sides of the vessel at same location) and less than one-half of the total lesion length. ? Severe: >180? (both sides of the vessel at same location) and greater than one-half of the total lesion length.   Coordinator Signature: Philemon Kingdom :)    Date:03/28/2017   Walking Impairment Questionnaire (WIQ) 1a. Date WIQ completed: 2017-03-28  1b. Time: (HH:MM)  0630  Num Question Very Much Some Slight None  A. Walking Impairment  2a. Pain, aching, or cramps in calves (or buttocks)? ? ? ? ? ?  2b. If 2a ? None, please indicate which Leg: Right  3a. Pain or aching in thighs? ? ? ? ? ?  3b. If 3a ? None, please indicate which Leg Both  4.  Pain, stiffness, or aching in joints (ankles, knees, or hips) ? ? ? ? ?  5. Weakness in one or both legs ? ? ? ? ?  6. Pain or discomfort in your chest ? ? ? ? ?  7. Shortness of breath ? ? ? ? ?  8.  Heart Palpitations ? ? ? ? ?           9.              Other problems   Num Question Unable Much Some Slight None  B. Walking Distance  10. Walking indoors (i.e. around house) ? ? ? ? ?  11. Walking 50 feet ? ? ? ? ?  12. Walking 150 feet (1/2 block) ? ? ? ? ?          Num Question Unable Much Some Slight None  13. Walking 300 feet (1 block) ? ? ? ? ?  14. Walking 600 feet (2 blocks) ? ? ? ? ?  15.  Walking 900 feet (3 blocks) ? ? ? ? ?  16.  Walking 1500 feet (5 blocks) ? ? ? ? ?  Walking Speed:  17. Walking 1 block slowly ?  ? ? ? ?  18.  Walking 1 block at an average speed? ? ? ? ? ?  19. Walking 1 block quickly? ? ? ? ? ?  20. Running or jogging 1 block ? ? ? ? ?  Stair Climbing:  21. Climbing 1 flight of stairs ? ? ? ? ?  22. Climbing 2 flights of stairs ? ? ? ? ?  23. Climbing 3 flights of stairs. ? ? ? ? ?   Coordinator Signature: Philemon Kingdom :)    Date: 03/28/2017  Inclusions/Exclusions ~Clinical~ Inclusions: Y   N ? ? Subject ? 18 years ? ? Subject has target limb Rutherford classification 2, 3, 4 ? ? Subject has provided written informed consent and is willing to comply with study follow up requirements.  Exclusions: ?  ? Subject has acute limb ischemia. ?  ? Subject underwent intervention involving the target vessel within the previous 90 days ? ? Subject underwent any lower extremity percutaneous treatment in the ipsilateral limb using a paclitaxel-eluting stent or a DCB within the previous 90 days.  ? ? Subject underwent PTA of the target lesion using a DCB within the previous 180 days.  ? ? Subject has had prior vascular intervention in  the contralateral limb within 14 days before the planned study index procedure or subject has planned vascular intervention in the contralateral limb within 30 days after the index procedure. ? ? Women who are pregnant, breast-feeding or intend to become pregnant or men who intend to father children during the time of the study.  ? ? Subject has life expectancy less than 2 years.  ? ? Subject is allergic to ALL antiplatelet treatments.  ? ? Subject has impaired renal function (i.e. serum creatinine level ?2.5 mg/dl). ? ? Subject is dialysis dependent. ? ? Subject is receiving immunosuppressant therapy. ? ? Subject has known or suspected active infection at the time of the index procedure. ? ? Subject has platelet count <100,000/mm or >700,000/mm ? ? Subject has history of gastrointestinal hemorrhage requiring a transfusion within 3 months prior to the  study procedure.  ? ? Subject diagnosed with coagulopathy that precludes the treatment with systemic anticoagulation and/or dual antiplatelet therapy (DAPT). ? ? Subject has history of stroke within the past 90 days.  ? ? Subject has a history of myocardial infarction within the past 30 days.  ? ? Subject is unable to tolerate blood transfusions because of religious beliefs or other reasons.  ? ? Subject is incarcerated, mentally incompetent, or abusing drugs or alcohol.  ? ? Subject is participating in another investigational drug or medical device study that has not completed primary endpoint(s) evaluation or that clinically interferes with the endpoints from this study, or subject is planning to participate in such studies prior to the completion of this study.  ? ?    Subject has previous bypass surgery of the target lesion. ? ? Subject had previous treatment of the target vessel with thrombolysis or surgery.  ? ? Subject is unwilling or unable to comply with procedures specified in the protocol or has difficulty or inability to return for follow up visits by the protocol.  ~Angiographic ~ The target lesion/vessel must meet all of the following angiographic criteria.  Inclusions: Y   N ? ? De novo lesion(s) or non-stented restenotic lesion(s) occurring >90 days after prior plain old balloon angioplasty or >180 days after prior [facility] treatment. ? ?    Target lesion location starts ?10 mm below the common femoral bifurcation and terminates distally at or above the end of the P1 segment of the popliteal artery.  ? ? Target vessel diameter ?4 mm and ?7 mm. ? ? Target lesion must have angiographic evidence of ?70% stenosis by operator visual estimate.  ? ? Chronic total occlusions may be included only after successful, uncomplicated wire crossing of target lesion via an anterograde approach. Successful crossing of the target lesion occurs when the tip of the guide wire is distal to the target lesion  without the occurrence of flow-limiting dissection or perforation and is judged by visual inspection to be within the true lumen. Subintimal dissection techniques may be used if re-entry occurs above the knee and without the use of re-entry devices.  ? ? Target lesion must be ?180 mm in length (one long lesion or multiple serial lesions) by operator visual estimate.  NOTE: combination lesions must have a total lesion length of ?180 mm by visual estimate and be separated by ?30 mm. ? ? Target lesion is located at least 30 mm from any stent, if target vessel was previously stented.  ? ? Successful, uncomplicated (without use of crossing device) wire crossing of target lesion. Successful crossing of the target lesion occurs when  the tip of the guide wire is distal to the target lesion without the occurrence of flow-limiting dissection or perforation and is judged by visual inspection to be within the true lumen. ? ? After pre-dilatation, the target lesion is ?70% residual stenosis absence of a flow limiting dissection and treatable with available device matrix. ? ? A patient inflow artery free from significant stenosis (?50% stenosis) as confirmed by angiography.  ? ? At least one patent native outflow artery to the ankle or foot, free from significant stenosis (?50 % stenosis) as confirmed by angiography. ~Angiographic~ Exclusions: Y   N ? ? Target lesion has severe calcification (as defined by the [facility] classification   of calcification).  ? ? Target lesion involves an aneurysm or is adjacent to an aneurysm (within 5 mm). ? ? Target lesion requires treatment with alternative therapy such as stenting, laser, atherectomy, cryoplasty, brachytherapy, or re-entry devices.  ? ? Significant target vessel tortuosity or other parameters prohibiting access to the target lesion.  ? ?  Presence of thrombus in the target vessel.  ? ? Iliac inflow disease requiring treatment, unless the iliac artery disease is  successfully treated first during the index procedure. Success is defined as ?30% residual diameter stenosis without death or major complications.  ? ? Presence of an aortic, iliac or femoral artificial graft.

## 2017-04-06 ENCOUNTER — Ambulatory Visit: Payer: Medicare Other | Admitting: Orthopedic Surgery

## 2017-04-10 ENCOUNTER — Ambulatory Visit: Payer: Medicare Other | Admitting: Orthopedic Surgery

## 2017-04-16 DIAGNOSIS — N342 Other urethritis: Secondary | ICD-10-CM | POA: Diagnosis not present

## 2017-04-16 DIAGNOSIS — R350 Frequency of micturition: Secondary | ICD-10-CM | POA: Diagnosis not present

## 2017-04-16 DIAGNOSIS — Z6824 Body mass index (BMI) 24.0-24.9, adult: Secondary | ICD-10-CM | POA: Diagnosis not present

## 2017-04-24 ENCOUNTER — Other Ambulatory Visit: Payer: Medicare Other | Admitting: Advanced Practice Midwife

## 2017-04-25 ENCOUNTER — Other Ambulatory Visit: Payer: Medicare Other | Admitting: Advanced Practice Midwife

## 2017-05-29 DIAGNOSIS — Z23 Encounter for immunization: Secondary | ICD-10-CM | POA: Diagnosis not present

## 2017-05-30 ENCOUNTER — Other Ambulatory Visit: Payer: Self-pay | Admitting: Podiatry

## 2017-05-30 ENCOUNTER — Ambulatory Visit (INDEPENDENT_AMBULATORY_CARE_PROVIDER_SITE_OTHER): Payer: Medicare Other

## 2017-05-30 ENCOUNTER — Ambulatory Visit (INDEPENDENT_AMBULATORY_CARE_PROVIDER_SITE_OTHER): Payer: Medicare Other | Admitting: Podiatry

## 2017-05-30 DIAGNOSIS — M779 Enthesopathy, unspecified: Secondary | ICD-10-CM

## 2017-05-30 DIAGNOSIS — M79671 Pain in right foot: Secondary | ICD-10-CM | POA: Diagnosis not present

## 2017-05-30 DIAGNOSIS — M79672 Pain in left foot: Secondary | ICD-10-CM

## 2017-05-30 DIAGNOSIS — M7752 Other enthesopathy of left foot: Secondary | ICD-10-CM | POA: Diagnosis not present

## 2017-05-30 MED ORDER — TRIAMCINOLONE ACETONIDE 10 MG/ML IJ SUSP
10.0000 mg | Freq: Once | INTRAMUSCULAR | Status: AC
  Administered 2017-05-30: 10 mg

## 2017-05-30 NOTE — Progress Notes (Signed)
Subjective:    Patient ID: Katelyn [name], female   DOB: 56 y.o.   MRN: [id number]   HPI patient presents stating she's had orthotics in the past and it's been 6 years and she's getting pain that's underneath her right big toe joint and it's gradually making it hard for her to walk. Patient states that the outside of the left foot has also been sore    ROS      Objective:  Physical Exam neurovascular status intact muscle strength adequate with patient noted to have plantar flexed first metatarsal right with inflammation around the bone structure and inflammation of the side of the fifth metatarsal left with fluid buildup     Assessment:    Inflammatory capsulitis sub-first MPJ left with pain and inflammatory capsulitis of the fifth metatarsal head left     Plan:    H&P conditions reviewed and discussed. At this point I've recommended a new orthotic of a softer variety with a deep depression around the first MPJ right to reduce the pressure against the expose metatarsal head and I did go ahead and I injected the capsule of the fifth MPJ left reduce the inflammation fluid around the surface. Patient will be seen back for this procedure

## 2017-06-11 ENCOUNTER — Encounter: Payer: Medicare Other | Admitting: Orthotics

## 2017-06-12 ENCOUNTER — Ambulatory Visit: Payer: Medicare Other | Admitting: Orthotics

## 2017-06-12 DIAGNOSIS — M779 Enthesopathy, unspecified: Secondary | ICD-10-CM

## 2017-06-12 DIAGNOSIS — M216X9 Other acquired deformities of unspecified foot: Secondary | ICD-10-CM

## 2017-06-12 DIAGNOSIS — Q828 Other specified congenital malformations of skin: Secondary | ICD-10-CM

## 2017-06-12 NOTE — Progress Notes (Signed)
Patient decided she didn't want the f/o after being told Medicare would not pay for them. Said she might revisit in January.

## 2017-06-20 ENCOUNTER — Encounter: Payer: Self-pay | Admitting: Advanced Practice Midwife

## 2017-06-20 ENCOUNTER — Other Ambulatory Visit (HOSPITAL_COMMUNITY)
Admission: RE | Admit: 2017-06-20 | Discharge: 2017-06-20 | Disposition: A | Discharge status: Home / Self Care | Payer: Medicare Other | Source: Ambulatory Visit | Attending: Advanced Practice Midwife | Admitting: Advanced Practice Midwife

## 2017-06-20 ENCOUNTER — Ambulatory Visit (INDEPENDENT_AMBULATORY_CARE_PROVIDER_SITE_OTHER): Payer: Medicare Other | Admitting: Advanced Practice Midwife

## 2017-06-20 VITALS — BP 132/80 | HR 60 | Ht 64.5 in | Wt 163.0 lb

## 2017-06-20 DIAGNOSIS — Z124 Encounter for screening for malignant neoplasm of cervix: Secondary | ICD-10-CM

## 2017-06-20 DIAGNOSIS — Z01419 Encounter for gynecological examination (general) (routine) without abnormal findings: Secondary | ICD-10-CM | POA: Insufficient documentation

## 2017-06-20 NOTE — Progress Notes (Signed)
Katelyn [name] 56 y.o.  Vitals:   06/20/17 0926  BP: 132/80  Pulse: 60  SpO2: 99%     Filed Weights   06/20/17 0926  Weight: 163 lb (73.9 kg)    Past Medical History: Past Medical History:  Diagnosis Date  . Degenerative disc disease, lumbar   . Depression   . GERD (gastroesophageal reflux disease)   . Goiter   . Hemorrhoids   . Hepatitis   . Hepatitis C infection 2004   cured with treatment of interferon and ribavirin  . Hypercholesteremia   . Hypertension   . Knee pain   . Panic attack   . PONV (postoperative nausea and vomiting)   . Tubular adenoma 10/14/12  . Varicose veins of both lower extremities with complications     Past Surgical History: Past Surgical History:  Procedure Laterality Date  . ABDOMINAL AORTOGRAM W/LOWER EXTREMITY N/A 03/28/2017   Procedure: ABDOMINAL AORTOGRAM W/LOWER EXTREMITY;  Surgeon: Waynetta Sandy, MD;  Location: [place] [facility];  Service: Cardiovascular;  Laterality: N/A;  . BACK SURGERY    . BACK SURGERY  2015  . CARDIOPULMONARY EXERCISE TEST  08/26/2012   RER 0.94, Peak VO2 87%, HR 69% predicted, normal VO2 curves  . COLONOSCOPY N/A 10/14/2012   Dr.Rourk-  normal rectum, 3 polyps in the cecum- the largest being approximately 5 mm in diameter. the remainder of the colonic mucosa appeared normal. bx= tubular adenoma  . FOOT SURGERY Right 2012  . FOOT SURGERY Right 2018  . HEMORRHOID BANDING    . NECK SURGERY  2005  . TONSILLECTOMY  1970   and adenoidectomy  . TOTAL SHOULDER ARTHROPLASTY  2011  . TRANSTHORACIC ECHOCARDIOGRAM  07/2012   EF 55-60%, mod LVH, mod conc LVH, RVSP increased (mild pulm HTN)  . TUBAL LIGATION  1988    Family History: Family History  Problem Relation Age of Onset  . Scoliosis Mother        also kidney infections  . Osteoarthritis Mother   . Glaucoma Mother   . Lung cancer Father   . Hepatitis C Sister        also OA  . Breast cancer Maternal Grandmother        and lymph cancer  .  Kidney cancer Maternal Grandfather   . ADD / ADHD Child   . ADD / ADHD Child   . Bipolar disorder Child   . Colon cancer Neg Hx     Social History: Social History   Tobacco Use  . Smoking status: Former Smoker    Packs/day: 0.00    Years: 35.00    Pack years: 0.00    Types: Cigarettes    Last attempt to quit: 07/03/2013    Years since quitting: 3.9  . Smokeless tobacco: Never Used  . Tobacco comment: Quit x one month  Substance Use Topics  . Alcohol use: Yes    Alcohol/week: 0.0 oz    Comment: RARELY  . Drug use: No    Allergies:  Allergies  Allergen Reactions  . Ancef [Cefazolin] Swelling    Pt had vague constriction of throat post op. Not sure it was abx. But could be so proceed cautiously with cephalosporins   . Sulfa Antibiotics Nausea And Vomiting and Rash      Current Outpatient Medications:  .  aspirin EC 81 MG tablet, Take 81 mg by mouth daily., Disp: , Rfl:  .  atorvastatin (LIPITOR) 20 MG tablet, Take 20 mg by mouth daily.,  Disp: , Rfl:  .  FLUoxetine (PROZAC) 40 MG capsule, Take 40 mg by mouth daily.  , Disp: , Rfl:  .  furosemide (LASIX) 20 MG tablet, Take 20-40 mg by mouth daily., Disp: , Rfl:  .  Inulin (FIBER CHOICE PO), Take 1 capsule by mouth daily. , Disp: , Rfl:  .  LINZESS 290 MCG CAPS capsule, TAKE (1) CAPSULE BY MOUTH ONCE DAILY., Disp: 30 capsule, Rfl: 5 .  lisinopril-hydrochlorothiazide (PRINZIDE,ZESTORETIC) 20-12.5 MG tablet, TAKE (1) TABLET BY MOUTH DAILY FOR HIGH BLOOD PRESSURE., Disp: 15 tablet, Rfl: 0 .  oxybutynin (DITROPAN) 5 MG tablet, Take 5 mg by mouth daily., Disp: , Rfl:  .  potassium chloride (K-DUR,KLOR-CON) 10 MEQ tablet, Take 1 tablet (10 mEq total) by mouth daily. NEEDS APPOINTMENT FOR FUTURE REFILLS, Disp: 15 tablet, Rfl: 0  History of Present Illness: here for Pap. Last pap2015, normal. Hasn[t gotten mammogram since last visit.  PCP manages HTN, cholesterol, etc.  Had unsuccessful hip vein dilation (couldn't get to it) last  month, has appt to maybe try a bypass next week. Hasn't had sex in 28 years   Review of Systems   Patient denies any headaches, blurred vision, shortness of breath, chest pain, abdominal pain, problems with bowel movements, urination   Physical Exam: General:  Well developed, well nourished, no acute distress Skin:  Warm and dry Neck:  Midline trachea, normal thyroid Lungs; Clear to auscultation bilaterally Breast:  No dominant palpable mass, retraction, or nipple discharge Cardiovascular: Regular rate and rhythm Abdomen:  Soft, non tender, no hepatosplenomegaly Pelvic:  External genitalia is normal in appearance.  The vagina is normal in appearance.  The cervix is bulbous.  Uterus is felt to be normal size, shape, and contour.  No adnexal masses or tenderness noted.  Extremities:  No swelling or varicosities noted Rectum:  hemocult negative, no polyps.hemorrhoids palpable Psych:  No mood changes.     Impression: normal gyn exam     Plan: If pap normal repeat 3 years.  Order for 3D mammogram, pt promises she will get it scheudled.  Needs colonoscopy next year.  Dr. Brenton Grills watches labs

## 2017-06-21 LAB — CYTOLOGY - PAP
ADEQUACY: ABSENT
Diagnosis: NEGATIVE
HPV (WINDOPATH): NOT DETECTED

## 2017-06-22 ENCOUNTER — Other Ambulatory Visit: Payer: Self-pay

## 2017-06-22 DIAGNOSIS — M79605 Pain in left leg: Principal | ICD-10-CM

## 2017-06-22 DIAGNOSIS — I739 Peripheral vascular disease, unspecified: Secondary | ICD-10-CM

## 2017-06-22 DIAGNOSIS — M79604 Pain in right leg: Secondary | ICD-10-CM

## 2017-06-29 ENCOUNTER — Ambulatory Visit (INDEPENDENT_AMBULATORY_CARE_PROVIDER_SITE_OTHER): Payer: Medicare Other | Admitting: Vascular Surgery

## 2017-06-29 ENCOUNTER — Ambulatory Visit (INDEPENDENT_AMBULATORY_CARE_PROVIDER_SITE_OTHER)
Admission: RE | Admit: 2017-06-29 | Discharge: 2017-06-29 | Disposition: A | Discharge status: Home / Self Care | Payer: Medicare Other | Source: Ambulatory Visit | Attending: Vascular Surgery | Admitting: Vascular Surgery

## 2017-06-29 ENCOUNTER — Encounter: Payer: Self-pay | Admitting: Vascular Surgery

## 2017-06-29 ENCOUNTER — Ambulatory Visit (HOSPITAL_COMMUNITY)
Admission: RE | Admit: 2017-06-29 | Discharge: 2017-06-29 | Disposition: A | Discharge status: Home / Self Care | Payer: Medicare Other | Source: Ambulatory Visit | Attending: Vascular Surgery | Admitting: Vascular Surgery

## 2017-06-29 ENCOUNTER — Other Ambulatory Visit: Payer: Self-pay

## 2017-06-29 VITALS — BP 134/69 | HR 57 | Temp 97.1°F | Resp 16 | Ht 64.5 in | Wt 160.0 lb

## 2017-06-29 DIAGNOSIS — I739 Peripheral vascular disease, unspecified: Secondary | ICD-10-CM

## 2017-06-29 DIAGNOSIS — M79605 Pain in left leg: Secondary | ICD-10-CM

## 2017-06-29 DIAGNOSIS — M79604 Pain in right leg: Secondary | ICD-10-CM

## 2017-06-29 DIAGNOSIS — I70211 Atherosclerosis of native arteries of extremities with intermittent claudication, right leg: Secondary | ICD-10-CM | POA: Diagnosis not present

## 2017-06-29 NOTE — Progress Notes (Signed)
[facility] [place] HISTORY AND PHYSICAL   History of Present Illness:  Patient is a 56 y.o. year old female who presents for evaluation f/u s/p angiogram with bilateral leg run off.  She has had cramping in her right lower extremity both with and without walking for some time. She also has had multiple foot surgeries which causes her pain in her right lower extremity.     Past Medical History:  Diagnosis Date  . Degenerative disc disease, lumbar   . Depression   . GERD (gastroesophageal reflux disease)   . Goiter   . Hemorrhoids   . Hepatitis   . Hepatitis C infection 2004   cured with treatment of interferon and ribavirin  . Hypercholesteremia   . Hypertension   . Knee pain   . Panic attack   . PONV (postoperative nausea and vomiting)   . Tubular adenoma 10/14/12  . Varicose veins of both lower extremities with complications     Past Surgical History:  Procedure Laterality Date  . ABDOMINAL AORTOGRAM W/LOWER EXTREMITY N/A 03/28/2017   Procedure: ABDOMINAL AORTOGRAM W/LOWER EXTREMITY;  Surgeon: Waynetta Sandy, MD;  Location: [facility];  Service: Cardiovascular;  Laterality: N/A;  . BACK SURGERY    . BACK SURGERY  2015  . CARDIOPULMONARY EXERCISE TEST  08/26/2012   RER 0.94, Peak VO2 87%, HR 69% predicted, normal VO2 curves  . COLONOSCOPY N/A 10/14/2012   Dr.Rourk-  normal rectum, 3 polyps in the cecum- the largest being approximately 5 mm in diameter. the remainder of the colonic mucosa appeared normal. bx= tubular adenoma  . FOOT SURGERY Right 2012  . FOOT SURGERY Right 2018  . HEMORRHOID BANDING    . NECK SURGERY  2005  . TONSILLECTOMY  1970   and adenoidectomy  . TOTAL SHOULDER ARTHROPLASTY  2011  . TRANSTHORACIC ECHOCARDIOGRAM  07/2012   EF 55-60%, mod LVH, mod conc LVH, RVSP increased (mild pulm HTN)  . TUBAL LIGATION  1988    ROS:   General:  No weight loss, Fever, chills  HEENT: No recent headaches, no nasal bleeding, no  visual changes, no sore throat  Neurologic: No dizziness, blackouts, seizures. No recent symptoms of stroke or mini- stroke. No recent episodes of slurred speech, or temporary blindness.  Cardiac: No recent episodes of chest pain/pressure, no shortness of breath at rest.  No shortness of breath with exertion.  Denies history of atrial fibrillation or irregular heartbeat  Vascular: No history of rest pain in feet.  No history of claudication.  No history of non-healing ulcer, No history of DVT   Pulmonary: No home oxygen, no productive cough, no hemoptysis,  No asthma or wheezing  Musculoskeletal:  [ ]  Arthritis, [ ]  Low back pain,  [ ]  Joint pain  Hematologic:No history of hypercoagulable state.  No history of easy bleeding.  No history of anemia  Gastrointestinal: No hematochezia or melena,  No gastroesophageal reflux, no trouble swallowing  Urinary: [ ]  chronic Kidney disease, [ ]  on HD - [ ]  MWF or [ ]  TTHS, [ ]  Burning with urination, [ ]  Frequent urination, [ ]  Difficulty urinating;   Skin: No rashes  Psychological: No history of anxiety,  No history of depression  Social History Social History   Tobacco Use  . Smoking status: Former Smoker    Packs/day: 0.00    Years: 35.00    Pack years: 0.00    Types: Cigarettes    Last attempt to  quit: 07/03/2013    Years since quitting: 3.9  . Smokeless tobacco: Never Used  . Tobacco comment: Quit x one month  Substance Use Topics  . Alcohol use: Yes    Alcohol/week: 0.0 oz    Comment: RARELY  . Drug use: No    Family History Family History  Problem Relation Age of Onset  . Scoliosis Mother        also kidney infections  . Osteoarthritis Mother   . Glaucoma Mother   . Lung cancer Father   . Hepatitis C Sister        also OA  . Breast cancer Maternal Grandmother        and lymph cancer  . Kidney cancer Maternal Grandfather   . ADD / ADHD Child   . ADD / ADHD Child   . Bipolar disorder Child   . Colon cancer Neg Hx      Allergies  Allergies  Allergen Reactions  . Ancef [Cefazolin] Swelling    Pt had vague constriction of throat post op. Not sure it was abx. But could be so proceed cautiously with cephalosporins   . Sulfa Antibiotics Nausea And Vomiting and Rash     Current Outpatient Medications  Medication Sig Dispense Refill  . aspirin EC 81 MG tablet Take 81 mg by mouth daily.    Marland Kitchen atorvastatin (LIPITOR) 20 MG tablet Take 20 mg by mouth daily.    Marland Kitchen FLUoxetine (PROZAC) 40 MG capsule Take 40 mg by mouth daily.      . furosemide (LASIX) 20 MG tablet Take 20-40 mg by mouth daily.    . Inulin (FIBER CHOICE PO) Take 1 capsule by mouth daily.     Marland Kitchen LINZESS 290 MCG CAPS capsule TAKE (1) CAPSULE BY MOUTH ONCE DAILY. 30 capsule 5  . lisinopril-hydrochlorothiazide (PRINZIDE,ZESTORETIC) 20-12.5 MG tablet TAKE (1) TABLET BY MOUTH DAILY FOR HIGH BLOOD PRESSURE. 15 tablet 0  . oxybutynin (DITROPAN) 5 MG tablet Take 5 mg by mouth daily.    . potassium chloride (K-DUR,KLOR-CON) 10 MEQ tablet Take 1 tablet (10 mEq total) by mouth daily. NEEDS APPOINTMENT FOR FUTURE REFILLS 15 tablet 0  . potassium chloride SA (K-DUR,KLOR-CON) 20 MEQ tablet     . SUBOXONE 8-2 MG FILM     . cephALEXin (KEFLEX) 500 MG capsule      No current facility-administered medications for this visit.     Physical Examination  Vitals:   06/29/17 1247  BP: 134/69  Pulse: (!) 57  Resp: 16  Temp: (!) 97.1 F (36.2 C)  TempSrc: Oral  SpO2: 98%  Weight: 160 lb (72.6 kg)  Height: 5' 4.5" (1.638 m)    Body mass index is 27.04 kg/m.  General:  Alert and oriented, no acute distress HEENT: Normal Neck: No bruit or JVD Pulmonary: Clear to auscultation bilaterally Cardiac: Regular Rate and Rhythm without murmur Abdomen: Soft, non-tender, non-distended, no mass, no scars Skin: No rash Extremity Pulses:  2+ radial,  dorsalis pedis,pulses bilaterally Musculoskeletal: No deformity or edema  Neurologic: Upper and lower extremity  motor 5/5 and symmetric  DATA:  ABI's  Right 0.90 with biphasic flow TBI 0.53 % Left > 1.0 with triphasic and biphasic flow TBI 0.48%  Angiogram 03/28/2017 Findings: The aortoiliac segments are free of disease that is flow-limiting. Right SFA is occluded at its origin and reconstitutes midthigh via profunda collateralization. There is three-vessel runoff to the feet bilaterally. There is no discernible flow limiting disease on the left side.  ASSESSMENT:  PAD right SFA occlusion Chronic feet problems and multiple orthopedic surgies      PLAN:  She has good arterial blood flow B.  We recommend a walking program as she tolerates.  She has stopped smoking > 1 year ago.  Continue statin and aspirin daily.  She will f/u in 1 year for repeat ABI's.     Roxy Horseman PA-C [facility]  Seen in conjunction with Dr. Donzetta Matters  I have examined patient with PA and agree with above findings. F/u in 1 year unless symptoms worsen.   Dakota Vanwart C. Donzetta Matters, MD [facility] [place] Office: [phone] Pager: [phone]

## 2017-07-06 DIAGNOSIS — E538 Deficiency of other specified B group vitamins: Secondary | ICD-10-CM | POA: Diagnosis not present

## 2017-07-06 DIAGNOSIS — K219 Gastro-esophageal reflux disease without esophagitis: Secondary | ICD-10-CM | POA: Diagnosis not present

## 2017-07-06 DIAGNOSIS — E559 Vitamin D deficiency, unspecified: Secondary | ICD-10-CM | POA: Diagnosis not present

## 2017-07-06 DIAGNOSIS — R5383 Other fatigue: Secondary | ICD-10-CM | POA: Diagnosis not present

## 2017-07-06 DIAGNOSIS — L659 Nonscarring hair loss, unspecified: Secondary | ICD-10-CM | POA: Diagnosis not present

## 2017-07-06 DIAGNOSIS — E039 Hypothyroidism, unspecified: Secondary | ICD-10-CM | POA: Diagnosis not present

## 2017-07-06 DIAGNOSIS — Z6827 Body mass index (BMI) 27.0-27.9, adult: Secondary | ICD-10-CM | POA: Diagnosis not present

## 2017-08-02 DIAGNOSIS — Z1231 Encounter for screening mammogram for malignant neoplasm of breast: Secondary | ICD-10-CM | POA: Diagnosis not present

## 2017-08-18 ENCOUNTER — Other Ambulatory Visit: Payer: Self-pay | Admitting: Nurse Practitioner

## 2017-08-21 ENCOUNTER — Encounter: Payer: Self-pay | Admitting: Internal Medicine

## 2017-08-21 NOTE — Telephone Encounter (Signed)
Patient needs ov.  

## 2017-08-21 NOTE — Telephone Encounter (Signed)
PATIENT SCHEDULED  °

## 2017-09-11 DIAGNOSIS — L03031 Cellulitis of right toe: Secondary | ICD-10-CM | POA: Diagnosis not present

## 2017-09-11 DIAGNOSIS — Z1389 Encounter for screening for other disorder: Secondary | ICD-10-CM | POA: Diagnosis not present

## 2017-09-11 DIAGNOSIS — E663 Overweight: Secondary | ICD-10-CM | POA: Diagnosis not present

## 2017-09-11 DIAGNOSIS — Z6828 Body mass index (BMI) 28.0-28.9, adult: Secondary | ICD-10-CM | POA: Diagnosis not present

## 2017-09-21 ENCOUNTER — Other Ambulatory Visit: Payer: Self-pay | Admitting: Gastroenterology

## 2017-10-01 ENCOUNTER — Other Ambulatory Visit: Payer: Self-pay | Admitting: Podiatry

## 2017-10-01 ENCOUNTER — Encounter: Payer: Self-pay | Admitting: Podiatry

## 2017-10-01 ENCOUNTER — Ambulatory Visit (INDEPENDENT_AMBULATORY_CARE_PROVIDER_SITE_OTHER): Payer: Medicare Other | Admitting: Podiatry

## 2017-10-01 ENCOUNTER — Ambulatory Visit (INDEPENDENT_AMBULATORY_CARE_PROVIDER_SITE_OTHER): Payer: Medicare Other

## 2017-10-01 DIAGNOSIS — M79671 Pain in right foot: Secondary | ICD-10-CM | POA: Diagnosis not present

## 2017-10-01 DIAGNOSIS — L84 Corns and callosities: Secondary | ICD-10-CM

## 2017-10-01 DIAGNOSIS — M779 Enthesopathy, unspecified: Secondary | ICD-10-CM

## 2017-10-01 DIAGNOSIS — E0590 Thyrotoxicosis, unspecified without thyrotoxic crisis or storm: Secondary | ICD-10-CM | POA: Insufficient documentation

## 2017-10-01 NOTE — Progress Notes (Signed)
Subjective:   Patient ID: Katelyn [name], female   DOB: 57 y.o.   MRN: [id number]   HPI Patient presents stating that she has had discomfort underneath her first metatarsal head right and she traumatized her right foot and is worried she broke her big toe.  Patient also states that she has generalized arch pain bilateral and knows she needs some kind of insert therapy   ROS      Objective:  Physical Exam  Neurovascular status intact with patient found to have inflammation discomfort around the first MPJ right with keratotic tissue formation and history of first metatarsal phalangeal joint fusion procedure.  Left foot shows discomfort in the fascia with pain in the mid arch of the left foot with patient also having had trauma to the right big toe     Assessment:  Possibility for fracture of the right hallux with inflammation noted and also plantarflexed first metatarsal right with chronic keratotic lesion formation right over left     Plan:  H&P x-ray reviewed and I have recommended a soft type orthotic to try to reduce the pressure on the first metatarsal head right and also lift the arch of bilateral.  Patient is referred to ped orthotist for this to be done and this will be made of a softer type material with a cut out around the first metatarsal head right to try to reduce stress.  X-ray indicates there is been fusion of the first metatarsal phalangeal joint right with screws in place and indications a satisfactory fusion with no indication of fracture the big toe

## 2017-10-04 ENCOUNTER — Other Ambulatory Visit: Payer: Self-pay | Admitting: *Deleted

## 2017-10-04 ENCOUNTER — Telehealth: Payer: Self-pay | Admitting: *Deleted

## 2017-10-04 ENCOUNTER — Ambulatory Visit (INDEPENDENT_AMBULATORY_CARE_PROVIDER_SITE_OTHER): Payer: Medicare Other | Admitting: Gastroenterology

## 2017-10-04 ENCOUNTER — Encounter: Payer: Self-pay | Admitting: *Deleted

## 2017-10-04 ENCOUNTER — Encounter: Payer: Self-pay | Admitting: Gastroenterology

## 2017-10-04 VITALS — BP 131/64 | HR 65 | Temp 97.6°F | Ht 63.5 in | Wt 169.2 lb

## 2017-10-04 DIAGNOSIS — D649 Anemia, unspecified: Secondary | ICD-10-CM

## 2017-10-04 DIAGNOSIS — K5900 Constipation, unspecified: Secondary | ICD-10-CM

## 2017-10-04 DIAGNOSIS — R5383 Other fatigue: Secondary | ICD-10-CM | POA: Diagnosis not present

## 2017-10-04 DIAGNOSIS — R400 Somnolence: Secondary | ICD-10-CM | POA: Diagnosis not present

## 2017-10-04 DIAGNOSIS — Z86010 Personal history of colonic polyps: Secondary | ICD-10-CM

## 2017-10-04 DIAGNOSIS — Z860101 Personal history of adenomatous and serrated colon polyps: Secondary | ICD-10-CM | POA: Insufficient documentation

## 2017-10-04 MED ORDER — LINACLOTIDE 290 MCG PO CAPS: ORAL_CAPSULE | ORAL | 3 refills | Status: DC

## 2017-10-04 MED ORDER — NA SULFATE-K SULFATE-MG SULF 17.5-3.13-1.6 GM/177ML PO SOLN: 1.0000 | ORAL | 0 refills | Status: DC

## 2017-10-04 NOTE — Progress Notes (Signed)
Primary Care Physician: Redmond School, MD  Primary Gastroenterologist:  Garfield Cornea, MD   Chief Complaint  Patient presents with  . Hemorrhoids    f/u. Doing okay now  . Irritable Bowel Syndrome    f/u. Doing well. Needs refill on linzess    HPI: Katelyn [name] is a 57 y.o. female here for follow-up of constipation. She is also due for five years surveillance colonoscopy for history of adenomas colon polyps. She is doing well on Linzess 290 g daily. No melena rectal bleeding. No abdominal pain. No upper G.I. symptoms. No unintentional weight loss. Denies NSAIDs or aspirin powder use.  She is on Suboxone daily.  Six months ago she was noted to have mild anemia. Back in June her hemoglobin was normal.   Current Outpatient Medications  Medication Sig Dispense Refill  . aspirin EC 81 MG tablet Take 81 mg by mouth daily.    Marland Kitchen atorvastatin (LIPITOR) 20 MG tablet Take 20 mg by mouth daily.    Marland Kitchen FLUoxetine (PROZAC) 40 MG capsule Take 40 mg by mouth daily.      . furosemide (LASIX) 20 MG tablet Take 40 mg by mouth daily.     Marland Kitchen gabapentin (NEURONTIN) 100 MG capsule Take 300 mg by mouth daily.     Marland Kitchen LINZESS 290 MCG CAPS capsule TAKE (1) CAPSULE BY MOUTH ONCE DAILY. 90 capsule 3  . lisinopril-hydrochlorothiazide (PRINZIDE,ZESTORETIC) 20-12.5 MG tablet TAKE (1) TABLET BY MOUTH DAILY FOR HIGH BLOOD PRESSURE. 15 tablet 0  . oxybutynin (DITROPAN) 5 MG tablet Take 5 mg by mouth daily.    . potassium chloride (K-DUR,KLOR-CON) 10 MEQ tablet Take 1 tablet (10 mEq total) by mouth daily. NEEDS APPOINTMENT FOR FUTURE REFILLS 15 tablet 0  . SUBOXONE 8-2 MG FILM daily.     Marland Kitchen triamcinolone ointment (KENALOG) 0.5 % as needed.      No current facility-administered medications for this visit.     Allergies as of 10/04/2017 - Review Complete 10/04/2017  Allergen Reaction Noted  . Ancef [cefazolin] Swelling 02/06/2013  . Sulfa antibiotics Nausea And Vomiting and Rash 04/26/2011   Past  Medical History:  Diagnosis Date  . Degenerative disc disease, lumbar   . Depression   . GERD (gastroesophageal reflux disease)   . Goiter   . Hemorrhoids   . Hepatitis   . Hepatitis C infection 2004   cured with treatment of interferon and ribavirin  . Hypercholesteremia   . Hypertension   . Knee pain   . Panic attack   . PONV (postoperative nausea and vomiting)   . Tubular adenoma 10/14/12  . Varicose veins of both lower extremities with complications    Past Surgical History:  Procedure Laterality Date  . ABDOMINAL AORTOGRAM W/LOWER EXTREMITY N/A 03/28/2017   Procedure: ABDOMINAL AORTOGRAM W/LOWER EXTREMITY;  Surgeon: Waynetta Sandy, MD;  Location: [facility];  Service: Cardiovascular;  Laterality: N/A;  . BACK SURGERY    . BACK SURGERY  2015  . CARDIOPULMONARY EXERCISE TEST  08/26/2012   RER 0.94, Peak VO2 87%, HR 69% predicted, normal VO2 curves  . COLONOSCOPY N/A 10/14/2012   Dr.Rourk-  normal rectum, 3 polyps in the cecum- the largest being approximately 5 mm in diameter. the remainder of the colonic mucosa appeared normal. bx= tubular adenoma  . FOOT SURGERY Right 2012  . FOOT SURGERY Right 2018  . HEMORRHOID BANDING    . NECK SURGERY  2005  . TONSILLECTOMY  1970  and adenoidectomy  . TOTAL SHOULDER ARTHROPLASTY  2011  . TRANSTHORACIC ECHOCARDIOGRAM  07/2012   EF 55-60%, mod LVH, mod conc LVH, RVSP increased (mild pulm HTN)  . TUBAL LIGATION  1988   Family History  Problem Relation Age of Onset  . Scoliosis Mother        also kidney infections  . Osteoarthritis Mother   . Glaucoma Mother   . Lung cancer Father   . Hepatitis C Sister        also OA  . Breast cancer Maternal Grandmother        and lymph cancer  . Kidney cancer Maternal Grandfather   . ADD / ADHD Child   . ADD / ADHD Child   . Bipolar disorder Child   . Colon cancer Neg Hx    Social History   Tobacco Use  . Smoking status: Former Smoker    Packs/day: 0.00    Years: 35.00      Pack years: 0.00    Types: Cigarettes    Last attempt to quit: 07/03/2013    Years since quitting: 4.2  . Smokeless tobacco: Never Used  . Tobacco comment: Quit x one month  Substance Use Topics  . Alcohol use: Yes    Alcohol/week: 0.0 oz    Comment: RARELY  . Drug use: No     ROS:  General: Negative for anorexia, weight loss, fever, chills, fatigue, weakness. ENT: Negative for hoarseness, difficulty swallowing , nasal congestion. CV: Negative for chest pain, angina, palpitations, dyspnea on exertion, peripheral edema.  Respiratory: Negative for dyspnea at rest, dyspnea on exertion, cough, sputum, wheezing.  GI: See history of present illness. GU:  Negative for dysuria, hematuria, urinary incontinence, urinary frequency, nocturnal urination.  Endo: Negative for unusual weight change.    Physical Examination:   BP 131/64   Pulse 65   Temp 97.6 F (36.4 C) (Oral)   Ht 5' 3.5" (1.613 m)   Wt 169 lb 3.2 oz (76.7 kg)   BMI 29.50 kg/m   General: Well-nourished, well-developed in no acute distress.  Eyes: No icterus. Mouth: Oropharyngeal mucosa moist and pink , no lesions erythema or exudate. Lungs: Clear to auscultation bilaterally.  Heart: Regular rate and rhythm, no murmurs rubs or gallops.  Abdomen: Bowel sounds are normal, nontender, nondistended, no hepatosplenomegaly or masses, no abdominal bruits or hernia , no rebound or guarding.   Extremities: No lower extremity edema. No clubbing or deformities. Neuro: Alert and oriented x 4   Skin: Warm and dry, no jaundice.   Psych: Alert and cooperative, normal mood and affect.  Labs:  Lab Results  Component Value Date   CREATININE 0.56 03/28/2017   BUN 10 03/28/2017   NA 136 03/28/2017   K 3.7 03/28/2017   CL 101 03/28/2017   CO2 28 03/28/2017   Lab Results  Component Value Date   WBC 5.6 03/28/2017   HGB 11.7 (L) 03/28/2017   HCT 35.2 (L) 03/28/2017   MCV 92.1 03/28/2017   PLT 248 03/28/2017    Imaging  Studies: Dg Foot 2 Views Right  Result Date: 10/01/2017 Please see detailed radiograph report in office note.

## 2017-10-04 NOTE — Patient Instructions (Signed)
1. Continue Linzess once daily for constipation. 2. Please have your labs done.  3. Colonoscopy as scheduled. See separate instructions.

## 2017-10-04 NOTE — Progress Notes (Signed)
cc'ed to pcp °

## 2017-10-04 NOTE — Assessment & Plan Note (Addendum)
Do for five years surveillance colonoscopy. Back in September she had mild normocytic anemia. Will follow-up now. Plan for deep sedation for her colonoscopy given Suboxone use.  I have discussed the risks, alternatives, benefits with regards to but not limited to the risk of reaction to medication, bleeding, infection, perforation and the patient is agreeable to proceed. Written consent to be obtained.

## 2017-10-04 NOTE — Assessment & Plan Note (Signed)
Clinically doing well on Linzess 246mcg daily. RX provided.

## 2017-10-04 NOTE — Telephone Encounter (Signed)
Pre-op scheduled for 10/29/17 at 11:00am. LMOVM. Letter mailed.

## 2017-10-08 ENCOUNTER — Ambulatory Visit (INDEPENDENT_AMBULATORY_CARE_PROVIDER_SITE_OTHER): Payer: Medicare Other | Admitting: Orthotics

## 2017-10-08 DIAGNOSIS — Z86010 Personal history of colonic polyps: Secondary | ICD-10-CM | POA: Diagnosis not present

## 2017-10-08 DIAGNOSIS — D649 Anemia, unspecified: Secondary | ICD-10-CM | POA: Diagnosis not present

## 2017-10-08 DIAGNOSIS — L84 Corns and callosities: Secondary | ICD-10-CM

## 2017-10-08 DIAGNOSIS — M79672 Pain in left foot: Secondary | ICD-10-CM

## 2017-10-08 DIAGNOSIS — M216X9 Other acquired deformities of unspecified foot: Secondary | ICD-10-CM

## 2017-10-08 DIAGNOSIS — M79671 Pain in right foot: Secondary | ICD-10-CM

## 2017-10-08 DIAGNOSIS — K5900 Constipation, unspecified: Secondary | ICD-10-CM | POA: Diagnosis not present

## 2017-10-08 DIAGNOSIS — M779 Enthesopathy, unspecified: Secondary | ICD-10-CM

## 2017-10-08 DIAGNOSIS — M79676 Pain in unspecified toe(s): Secondary | ICD-10-CM

## 2017-10-08 NOTE — Progress Notes (Signed)
Please let patient know her anemia has resolved. Iron studies pending. TCS as planned.

## 2017-10-08 NOTE — Progress Notes (Signed)
Patient came into today to be cast for [employer]. Upon recommendation of Dr. Paulla Dolly Patient presents with hallux Rigidus RIGHT Goals are to offload 1st MPJ, this will be done through a reverse Morton's extension and 1st met head cut out in shell.  Plan vendor [employer]

## 2017-10-09 LAB — CBC WITH DIFFERENTIAL/PLATELET
BASOS ABS: 17 {cells}/uL (ref 0–200)
Basophils Relative: 0.3 %
EOS PCT: 1.4 %
Eosinophils Absolute: 81 cells/uL (ref 15–500)
HEMATOCRIT: 39.4 % (ref 35.0–45.0)
HEMOGLOBIN: 13.8 g/dL (ref 11.7–15.5)
Lymphs Abs: 1369 cells/uL (ref 850–3900)
MCH: 30.9 pg (ref 27.0–33.0)
MCHC: 35 g/dL (ref 32.0–36.0)
MCV: 88.3 fL (ref 80.0–100.0)
MONOS PCT: 5.5 %
MPV: 10.2 fL (ref 7.5–12.5)
NEUTROS ABS: 4014 {cells}/uL (ref 1500–7800)
Neutrophils Relative %: 69.2 %
Platelets: 315 10*3/uL (ref 140–400)
RBC: 4.46 10*6/uL (ref 3.80–5.10)
RDW: 12.1 % (ref 11.0–15.0)
Total Lymphocyte: 23.6 %
WBC mixed population: 319 cells/uL (ref 200–950)
WBC: 5.8 10*3/uL (ref 3.8–10.8)

## 2017-10-09 LAB — IRON,TIBC AND FERRITIN PANEL
%SAT: 21 % (calc) (ref 11–50)
FERRITIN: 149 ng/mL (ref 10–232)
IRON: 71 ug/dL (ref 45–160)
TIBC: 339 mcg/dL (calc) (ref 250–450)

## 2017-10-09 NOTE — Progress Notes (Signed)
No iron deficiency. TCS as planned.

## 2017-10-22 DIAGNOSIS — N390 Urinary tract infection, site not specified: Secondary | ICD-10-CM

## 2017-10-22 HISTORY — DX: Urinary tract infection, site not specified: N39.0

## 2017-10-23 DIAGNOSIS — G4730 Sleep apnea, unspecified: Secondary | ICD-10-CM | POA: Diagnosis not present

## 2017-10-24 DIAGNOSIS — G4730 Sleep apnea, unspecified: Secondary | ICD-10-CM | POA: Diagnosis not present

## 2017-10-25 DIAGNOSIS — G4730 Sleep apnea, unspecified: Secondary | ICD-10-CM | POA: Diagnosis not present

## 2017-10-25 NOTE — Patient Instructions (Signed)
Katelyn [name]  10/25/2017     @PREFPERIOPPHARMACY @   Your procedure is scheduled on 11/05/2017.  Report to [facility] at 8:00 A.M.  Call this number if you have problems the morning of surgery:  [phone]   Remember:  Do not eat food or drink liquids after midnight.  Take these medicines the morning of surgery with A SIP OF WATER Prozac, Gabapentin, Claritin, Ditropan, Suboxone   Do not wear jewelry, make-up or nail polish.  Do not wear lotions, powders, or perfumes, or deodorant.  Do not shave 48 hours prior to surgery.  Men may shave face and neck.  Do not bring valuables to the hospital.  [facility] is not responsible for any belongings or valuables.  Contacts, dentures or bridgework may not be worn into surgery.  Leave your suitcase in the car.  After surgery it may be brought to your room.  For patients admitted to the hospital, discharge time will be determined by your treatment team.  Patients discharged the day of surgery will not be allowed to drive home.    Please read over the following fact sheets that you were given. Anesthesia Post-op Instructions     PATIENT INSTRUCTIONS POST-ANESTHESIA  IMMEDIATELY FOLLOWING SURGERY:  Do not drive or operate machinery for the first twenty four hours after surgery.  Do not make any important decisions for twenty four hours after surgery or while taking narcotic pain medications or sedatives.  If you develop intractable nausea and vomiting or a severe headache please notify your doctor immediately.  FOLLOW-UP:  Please make an appointment with your surgeon as instructed. You do not need to follow up with anesthesia unless specifically instructed to do so.  WOUND CARE INSTRUCTIONS (if applicable):  Keep a dry clean dressing on the anesthesia/puncture wound site if there is drainage.  Once the wound has quit draining you may leave it open to air.  Generally you should leave the bandage intact for twenty four hours unless  there is drainage.  If the epidural site drains for more than 36-48 hours please call the anesthesia department.  QUESTIONS?:  Please feel free to call your physician or the hospital operator if you have any questions, and they will be happy to assist you.      Colonoscopy, Adult A colonoscopy is an exam to look at the entire large intestine. During the exam, a lubricated, bendable tube is inserted into the anus and then passed into the rectum, colon, and other parts of the large intestine. A colonoscopy is often done as a part of normal colorectal screening or in response to certain symptoms, such as anemia, persistent diarrhea, abdominal pain, and blood in the stool. The exam can help screen for and diagnose medical problems, including:  Tumors.  Polyps.  Inflammation.  Areas of bleeding.  Tell a health care provider about:  Any allergies you have.  All medicines you are taking, including vitamins, herbs, eye drops, creams, and over-the-counter medicines.  Any problems you or family members have had with anesthetic medicines.  Any blood disorders you have.  Any surgeries you have had.  Any medical conditions you have.  Any problems you have had passing stool. What are the risks? Generally, this is a safe procedure. However, problems may occur, including:  Bleeding.  A tear in the intestine.  A reaction to medicines given during the exam.  Infection (rare).  What happens before the procedure? Eating and drinking restrictions Follow instructions from your health care  provider about eating and drinking, which may include:  A few days before the procedure - follow a low-fiber diet. Avoid nuts, seeds, dried fruit, raw fruits, and vegetables.  1-3 days before the procedure - follow a clear liquid diet. Drink only clear liquids, such as clear broth or bouillon, black coffee or tea, clear juice, clear soft drinks or sports drinks, gelatin dessert, and popsicles. Avoid any  liquids that contain red or purple dye.  On the day of the procedure - do not eat or drink anything during the 2 hours before the procedure, or within the time period that your health care provider recommends.  Bowel prep If you were prescribed an oral bowel prep to clean out your colon:  Take it as told by your health care provider. Starting the day before your procedure, you will need to drink a large amount of medicated liquid. The liquid will cause you to have multiple loose stools until your stool is almost clear or light green.  If your skin or anus gets irritated from diarrhea, you may use these to relieve the irritation: ? Medicated wipes, such as adult wet wipes with aloe and vitamin E. ? A skin soothing-product like petroleum jelly.  If you vomit while drinking the bowel prep, take a break for up to 60 minutes and then begin the bowel prep again. If vomiting continues and you cannot take the bowel prep without vomiting, call your health care provider.  General instructions  Ask your health care provider about changing or stopping your regular medicines. This is especially important if you are taking diabetes medicines or blood thinners.  Plan to have someone take you home from the hospital or clinic. What happens during the procedure?  An IV tube may be inserted into one of your veins.  You will be given medicine to help you relax (sedative).  To reduce your risk of infection: ? Your health care team will wash or sanitize their hands. ? Your anal area will be washed with soap.  You will be asked to lie on your side with your knees bent.  Your health care provider will lubricate a long, thin, flexible tube. The tube will have a camera and a light on the end.  The tube will be inserted into your anus.  The tube will be gently eased through your rectum and colon.  Air will be delivered into your colon to keep it open. You may feel some pressure or cramping.  The camera  will be used to take images during the procedure.  A small tissue sample may be removed from your body to be examined under a microscope (biopsy). If any potential problems are found, the tissue will be sent to a lab for testing.  If small polyps are found, your health care provider may remove them and have them checked for cancer cells.  The tube that was inserted into your anus will be slowly removed. The procedure may vary among health care providers and hospitals. What happens after the procedure?  Your blood pressure, heart rate, breathing rate, and blood oxygen level will be monitored until the medicines you were given have worn off.  Do not drive for 24 hours after the exam.  You may have a small amount of blood in your stool.  You may pass gas and have mild abdominal cramping or bloating due to the air that was used to inflate your colon during the exam.  It is up to you to get  the results of your procedure. Ask your health care provider, or the department performing the procedure, when your results will be ready. This information is not intended to replace advice given to you by your health care provider. Make sure you discuss any questions you have with your health care provider. Document Released: 07/07/2000 Document Revised: 05/10/2016 Document Reviewed: 09/21/2015 Elsevier Interactive Patient Education  2018 [employer].

## 2017-10-29 ENCOUNTER — Other Ambulatory Visit: Payer: Medicare Other | Admitting: Orthotics

## 2017-10-29 ENCOUNTER — Other Ambulatory Visit: Payer: Self-pay

## 2017-10-29 ENCOUNTER — Encounter (HOSPITAL_COMMUNITY)
Admission: RE | Admit: 2017-10-29 | Discharge: 2017-10-29 | Disposition: A | Discharge status: Home / Self Care | Payer: Medicare Other | Source: Ambulatory Visit | Attending: Internal Medicine | Admitting: Internal Medicine

## 2017-10-29 ENCOUNTER — Telehealth: Payer: Self-pay | Admitting: General Practice

## 2017-10-29 ENCOUNTER — Encounter (HOSPITAL_COMMUNITY): Payer: Self-pay

## 2017-10-29 DIAGNOSIS — Z01812 Encounter for preprocedural laboratory examination: Secondary | ICD-10-CM | POA: Diagnosis not present

## 2017-10-29 HISTORY — DX: Unspecified convulsions: R56.9

## 2017-10-29 LAB — CBC WITH DIFFERENTIAL/PLATELET
Basophils Absolute: 0 10*3/uL (ref 0.0–0.1)
Basophils Relative: 0 %
Eosinophils Absolute: 0.3 10*3/uL (ref 0.0–0.7)
Eosinophils Relative: 6 %
HEMATOCRIT: 36.8 % (ref 36.0–46.0)
HEMOGLOBIN: 12.1 g/dL (ref 12.0–15.0)
LYMPHS ABS: 1.4 10*3/uL (ref 0.7–4.0)
LYMPHS PCT: 31 %
MCH: 30.8 pg (ref 26.0–34.0)
MCHC: 32.9 g/dL (ref 30.0–36.0)
MCV: 93.6 fL (ref 78.0–100.0)
MONOS PCT: 7 %
Monocytes Absolute: 0.3 10*3/uL (ref 0.1–1.0)
NEUTROS ABS: 2.6 10*3/uL (ref 1.7–7.7)
NEUTROS PCT: 56 %
Platelets: 271 10*3/uL (ref 150–400)
RBC: 3.93 MIL/uL (ref 3.87–5.11)
RDW: 12.8 % (ref 11.5–15.5)
WBC: 4.6 10*3/uL (ref 4.0–10.5)

## 2017-10-29 LAB — BASIC METABOLIC PANEL
Anion gap: 10 (ref 5–15)
BUN: 13 mg/dL (ref 6–20)
CHLORIDE: 95 mmol/L — AB (ref 101–111)
CO2: 32 mmol/L (ref 22–32)
CREATININE: 0.59 mg/dL (ref 0.44–1.00)
Calcium: 8.7 mg/dL — ABNORMAL LOW (ref 8.9–10.3)
GFR calc Af Amer: >60 mL/min (ref >60)
GFR calc non Af Amer: >60 mL/min (ref >60)
GLUCOSE: 133 mg/dL — AB (ref 65–99)
Potassium: 2.7 mmol/L — CL (ref 3.5–5.1)
Sodium: 137 mmol/L (ref 135–145)

## 2017-10-29 MED ORDER — POTASSIUM CHLORIDE ER 10 MEQ PO TBCR: 40.0000 meq | EXTENDED_RELEASE_TABLET | Freq: Two times a day (BID) | ORAL | 0 refills | Status: DC

## 2017-10-29 NOTE — Progress Notes (Signed)
See separate telephone note. Addressed by AB.

## 2017-10-29 NOTE — Addendum Note (Signed)
Addended by: Annitta Needs on: 10/29/2017 01:57 PM   Modules accepted: Orders

## 2017-10-29 NOTE — Telephone Encounter (Signed)
I received a call from [place] in endosopy and she stated the patient has a critically low Potassium of 2.7.   Routing to [place]

## 2017-10-29 NOTE — Telephone Encounter (Signed)
History of hypokalemia, on lasix and appears she was prescribed potassium at one time, but I am unsure if she is taking this or not.  1. We can handle this as outpatient with oral potassium. I am sending to pharmacy to take 40 meQ potassium BID. (Do not take her other prescription IF she has it).  If she has any chest pain or significant weakness, fatigue, would go to ED. However, we should be able to handle this as outpatient as long as she picks up prescription and takes accordingly 2. Please find out IF she is on potassium supplementation every day. If not, she needs to follow-up with PCP if she is going to continue lasix therapy daily.  3. Needs repeat BMP on Friday.   Routing to [place] as FYI who saw patient in the office.

## 2017-10-29 NOTE — Pre-Procedure Instructions (Signed)
Critical potassium of 2.7 called to [place] at Dr Roseanne Kaufman office.

## 2017-10-29 NOTE — Pre-Procedure Instructions (Signed)
Potassium of 2.7 reported to Dr Sheralyn Boatman. I-stat will be repeated on arrival morning of surgery.

## 2017-10-29 NOTE — Telephone Encounter (Signed)
Tried calling home number and cell number. Not able to leave a voice mail. Will call pt again.

## 2017-10-29 NOTE — Telephone Encounter (Signed)
I tried all the numbers listed for the patient and was not able to leave a message.  We will keep trying.

## 2017-10-30 NOTE — Telephone Encounter (Signed)
Tried reaching pt. Not able to leave a voice message.

## 2017-10-31 ENCOUNTER — Other Ambulatory Visit: Payer: Medicare Other | Admitting: Orthotics

## 2017-10-31 DIAGNOSIS — E063 Autoimmune thyroiditis: Secondary | ICD-10-CM | POA: Diagnosis not present

## 2017-10-31 DIAGNOSIS — F329 Major depressive disorder, single episode, unspecified: Secondary | ICD-10-CM | POA: Diagnosis not present

## 2017-10-31 DIAGNOSIS — N39498 Other specified urinary incontinence: Secondary | ICD-10-CM | POA: Diagnosis not present

## 2017-10-31 DIAGNOSIS — Z6828 Body mass index (BMI) 28.0-28.9, adult: Secondary | ICD-10-CM | POA: Diagnosis not present

## 2017-10-31 DIAGNOSIS — N3281 Overactive bladder: Secondary | ICD-10-CM | POA: Diagnosis not present

## 2017-10-31 DIAGNOSIS — J3089 Other allergic rhinitis: Secondary | ICD-10-CM | POA: Diagnosis not present

## 2017-10-31 DIAGNOSIS — E663 Overweight: Secondary | ICD-10-CM | POA: Diagnosis not present

## 2017-10-31 DIAGNOSIS — M67969 Unspecified disorder of synovium and tendon, unspecified lower leg: Secondary | ICD-10-CM | POA: Diagnosis not present

## 2017-10-31 DIAGNOSIS — I10 Essential (primary) hypertension: Secondary | ICD-10-CM | POA: Diagnosis not present

## 2017-10-31 DIAGNOSIS — M5136 Other intervertebral disc degeneration, lumbar region: Secondary | ICD-10-CM | POA: Diagnosis not present

## 2017-10-31 DIAGNOSIS — R5383 Other fatigue: Secondary | ICD-10-CM | POA: Diagnosis not present

## 2017-10-31 NOTE — Telephone Encounter (Signed)
Routing message 

## 2017-10-31 NOTE — Telephone Encounter (Signed)
Spoke with pt. She is at her PCP office because she was feeling very tire. Pt is going to pick up her potassium tab. Pt currently isn't taking a potassium supplement. Pt is going to explain all of this to her PCP. PT is going to have her pcp drawn her lab work.

## 2017-10-31 NOTE — Telephone Encounter (Signed)
Lm with pts mother. Pts mother is going to have her give me a call back. Pts mother is aware that her potassium is very low and medication has been called in for her.

## 2017-11-01 NOTE — Telephone Encounter (Signed)
Noted pt is being followed by her pcp.

## 2017-11-01 NOTE — Telephone Encounter (Signed)
Noted. Appreciate PCP involvement. Have her continue follow-up with them as she will likely need supplemental potassium in setting of lasix going forward.

## 2017-11-05 ENCOUNTER — Encounter (HOSPITAL_COMMUNITY): Payer: Self-pay | Admitting: *Deleted

## 2017-11-05 ENCOUNTER — Ambulatory Visit (HOSPITAL_COMMUNITY)
Admission: RE | Admit: 2017-11-05 | Discharge: 2017-11-05 | Disposition: A | Discharge status: Home / Self Care | Payer: Medicare Other | Source: Ambulatory Visit | Attending: Internal Medicine | Admitting: Internal Medicine

## 2017-11-05 ENCOUNTER — Encounter (HOSPITAL_COMMUNITY)
Admission: RE | Disposition: A | Discharge status: Home / Self Care | Payer: Self-pay | Source: Ambulatory Visit | Attending: Internal Medicine

## 2017-11-05 ENCOUNTER — Ambulatory Visit (HOSPITAL_COMMUNITY): Payer: Medicare Other | Admitting: Anesthesiology

## 2017-11-05 ENCOUNTER — Other Ambulatory Visit: Payer: Self-pay

## 2017-11-05 DIAGNOSIS — D649 Anemia, unspecified: Secondary | ICD-10-CM

## 2017-11-05 DIAGNOSIS — Z79899 Other long term (current) drug therapy: Secondary | ICD-10-CM | POA: Insufficient documentation

## 2017-11-05 DIAGNOSIS — E7800 Pure hypercholesterolemia, unspecified: Secondary | ICD-10-CM | POA: Diagnosis not present

## 2017-11-05 DIAGNOSIS — Z86010 Personal history of colonic polyps: Secondary | ICD-10-CM | POA: Insufficient documentation

## 2017-11-05 DIAGNOSIS — Z79891 Long term (current) use of opiate analgesic: Secondary | ICD-10-CM | POA: Diagnosis not present

## 2017-11-05 DIAGNOSIS — Z87891 Personal history of nicotine dependence: Secondary | ICD-10-CM | POA: Insufficient documentation

## 2017-11-05 DIAGNOSIS — Z7982 Long term (current) use of aspirin: Secondary | ICD-10-CM | POA: Insufficient documentation

## 2017-11-05 DIAGNOSIS — F329 Major depressive disorder, single episode, unspecified: Secondary | ICD-10-CM | POA: Insufficient documentation

## 2017-11-05 DIAGNOSIS — I10 Essential (primary) hypertension: Secondary | ICD-10-CM | POA: Insufficient documentation

## 2017-11-05 DIAGNOSIS — D123 Benign neoplasm of transverse colon: Secondary | ICD-10-CM | POA: Diagnosis not present

## 2017-11-05 DIAGNOSIS — Z1211 Encounter for screening for malignant neoplasm of colon: Secondary | ICD-10-CM | POA: Insufficient documentation

## 2017-11-05 DIAGNOSIS — K635 Polyp of colon: Secondary | ICD-10-CM | POA: Insufficient documentation

## 2017-11-05 DIAGNOSIS — Z96619 Presence of unspecified artificial shoulder joint: Secondary | ICD-10-CM | POA: Insufficient documentation

## 2017-11-05 HISTORY — PX: POLYPECTOMY: SHX5525

## 2017-11-05 HISTORY — PX: COLONOSCOPY WITH PROPOFOL: SHX5780

## 2017-11-05 HISTORY — DX: Urinary tract infection, site not specified: N39.0

## 2017-11-05 SURGERY — COLONOSCOPY WITH PROPOFOL
Anesthesia: Monitor Anesthesia Care

## 2017-11-05 MED ORDER — CHLORHEXIDINE GLUCONATE CLOTH 2 % EX PADS: 6.0000 | MEDICATED_PAD | Freq: Once | CUTANEOUS | Status: DC

## 2017-11-05 MED ORDER — PROMETHAZINE HCL 25 MG/ML IJ SOLN: 6.2500 mg | INTRAMUSCULAR | Status: DC | PRN

## 2017-11-05 MED ORDER — ACETAMINOPHEN 10 MG/ML IV SOLN: 1000.0000 mg | Freq: Once | INTRAVENOUS | Status: DC | PRN

## 2017-11-05 MED ORDER — PROPOFOL 500 MG/50ML IV EMUL
INTRAVENOUS | Status: DC | PRN
  Administered 2017-11-05: 11:00:00 via INTRAVENOUS
  Administered 2017-11-05: 150 ug/kg/min via INTRAVENOUS

## 2017-11-05 MED ORDER — HYDROMORPHONE HCL 1 MG/ML IJ SOLN: 0.2500 mg | INTRAMUSCULAR | Status: DC | PRN

## 2017-11-05 MED ORDER — MIDAZOLAM HCL 2 MG/2ML IJ SOLN
INTRAMUSCULAR | Status: AC
  Filled 2017-11-05: qty 2

## 2017-11-05 MED ORDER — PROPOFOL 10 MG/ML IV BOLUS
INTRAVENOUS | Status: AC
  Filled 2017-11-05: qty 40

## 2017-11-05 MED ORDER — PROPOFOL 10 MG/ML IV BOLUS
INTRAVENOUS | Status: DC | PRN
  Administered 2017-11-05: 20 mg via INTRAVENOUS
  Administered 2017-11-05: 30 mg via INTRAVENOUS

## 2017-11-05 MED ORDER — MIDAZOLAM HCL 5 MG/5ML IJ SOLN
INTRAMUSCULAR | Status: DC | PRN
  Administered 2017-11-05: 2 mg via INTRAVENOUS

## 2017-11-05 MED ORDER — LACTATED RINGERS IV SOLN
INTRAVENOUS | Status: DC
  Administered 2017-11-05: 1000 mL via INTRAVENOUS

## 2017-11-05 MED ORDER — MIDAZOLAM HCL 2 MG/2ML IJ SOLN: 0.5000 mg | Freq: Once | INTRAMUSCULAR | Status: DC | PRN

## 2017-11-05 NOTE — Discharge Instructions (Signed)
°Colonoscopy °Discharge Instructions ° °Read the instructions outlined below and refer to this sheet in the next few weeks. These discharge instructions provide you with general information on caring for yourself after you leave the hospital. Your doctor may also give you specific instructions. While your treatment has been planned according to the most current medical practices available, unavoidable complications occasionally occur. If you have any problems or questions after discharge, call Dr. Rourk at [phone]. °ACTIVITY °· You may resume your regular activity, but move at a slower pace for the next 24 hours.  °· Take frequent rest periods for the next 24 hours.  °· Walking will help get rid of the air and reduce the bloated feeling in your belly (abdomen).  °· No driving for 24 hours (because of the medicine (anesthesia) used during the test).   °· Do not sign any important legal documents or operate any machinery for 24 hours (because of the anesthesia used during the test).  °NUTRITION °· Drink plenty of fluids.  °· You may resume your normal diet as instructed by your doctor.  °· Begin with a light meal and progress to your normal diet. Heavy or fried foods are harder to digest and may make you feel sick to your stomach (nauseated).  °· Avoid alcoholic beverages for 24 hours or as instructed.  °MEDICATIONS °· You may resume your normal medications unless your doctor tells you otherwise.  °WHAT YOU CAN EXPECT TODAY °· Some feelings of bloating in the abdomen.  °· Passage of more gas than usual.  °· Spotting of blood in your stool or on the toilet paper.  °IF YOU HAD POLYPS REMOVED DURING THE COLONOSCOPY: °· No aspirin products for 7 days or as instructed.  °· No alcohol for 7 days or as instructed.  °· Eat a soft diet for the next 24 hours.  °FINDING OUT THE RESULTS OF YOUR TEST °Not all test results are available during your visit. If your test results are not back during the visit, make an appointment  with your caregiver to find out the results. Do not assume everything is normal if you have not heard from your caregiver or the medical facility. It is important for you to follow up on all of your test results.  °SEEK IMMEDIATE MEDICAL ATTENTION IF: °· You have more than a spotting of blood in your stool.  °· Your belly is swollen (abdominal distention).  °· You are nauseated or vomiting.  °· You have a temperature over 101.  °· You have abdominal pain or discomfort that is severe or gets worse throughout the day.  ° ° °Colon polyp information provided  ° °Further recommendations to follow pending review of pathology report ° °PATIENT INSTRUCTIONS °POST-ANESTHESIA ° °IMMEDIATELY FOLLOWING SURGERY:  Do not drive or operate machinery for the first twenty four hours after surgery.  Do not make any important decisions for twenty four hours after surgery or while taking narcotic pain medications or sedatives.  If you develop intractable nausea and vomiting or a severe headache please notify your doctor immediately. ° °FOLLOW-UP:  Please make an appointment with your surgeon as instructed. You do not need to follow up with anesthesia unless specifically instructed to do so. ° °WOUND CARE INSTRUCTIONS (if applicable):  Keep a dry clean dressing on the anesthesia/puncture wound site if there is drainage.  Once the wound has quit draining you may leave it open to air.  Generally you should leave the bandage intact for twenty four hours   unless there is drainage.  If the epidural site drains for more than 36-48 hours please call the anesthesia department. ° °QUESTIONS?:  Please feel free to call your physician or the hospital operator if you have any questions, and they will be happy to assist you.    ° ° ° °Colon Polyps °Polyps are tissue growths inside the body. Polyps can grow in many places, including the large intestine (colon). A polyp may be a round bump or a mushroom-shaped growth. You could have one polyp or  several. °Most colon polyps are noncancerous (benign). However, some colon polyps can become cancerous over time. °What are the causes? °The exact cause of colon polyps is not known. °What increases the risk? °This condition is more likely to develop in people who: °· Have a family history of colon cancer or colon polyps. °· Are older than 50 or older than 45 if they are African American. °· Have inflammatory bowel disease, such as ulcerative colitis or Crohn disease. °· Are overweight. °· Smoke cigarettes. °· Do not get enough exercise. °· Drink too much alcohol. °· Eat a diet that is: °? High in fat and red meat. °? Low in fiber. °· Had childhood cancer that was treated with abdominal radiation. ° °What are the signs or symptoms? °Most polyps do not cause symptoms. If you have symptoms, they may include: °· Blood coming from your rectum when having a bowel movement. °· Blood in your stool. The stool may look dark red or black. °· A change in bowel habits, such as constipation or diarrhea. ° °How is this diagnosed? °This condition is diagnosed with a colonoscopy. This is a procedure that uses a lighted, flexible scope to look at the inside of your colon. °How is this treated? °Treatment for this condition involves removing any polyps that are found. Those polyps will then be tested for cancer. If cancer is found, your health care provider will talk to you about options for colon cancer treatment. °Follow these instructions at home: °Diet °· Eat plenty of fiber, such as fruits, vegetables, and whole grains. °· Eat foods that are high in calcium and vitamin D, such as milk, cheese, yogurt, eggs, liver, fish, and broccoli. °· Limit foods high in fat, red meats, and processed meats, such as hot dogs, sausage, bacon, and lunch meats. °· Maintain a healthy weight, or lose weight if recommended by your health care provider. °General instructions °· Do not smoke cigarettes. °· Do not drink alcohol excessively. °· Keep all  follow-up visits as told by your health care provider. This is important. This includes keeping regularly scheduled colonoscopies. Talk to your health care provider about when you need a colonoscopy. °· Exercise every day or as told by your health care provider. °Contact a health care provider if: °· You have new or worsening bleeding during a bowel movement. °· You have new or increased blood in your stool. °· You have a change in bowel habits. °· You unexpectedly lose weight. °This information is not intended to replace advice given to you by your health care provider. Make sure you discuss any questions you have with your health care provider. °Document Released: 04/05/2004 Document Revised: 12/16/2015 Document Reviewed: 05/31/2015 °Elsevier Interactive Patient Education © 2018 [employer] ° ° ° ° ° ° °

## 2017-11-05 NOTE — Addendum Note (Signed)
Addendum  created 11/05/17 1149 by Benay Pike, MD   Order list changed, Order sets accessed

## 2017-11-05 NOTE — Anesthesia Preprocedure Evaluation (Addendum)
Anesthesia Evaluation  Patient identified by MRN, date of birth, ID band Patient awake    Reviewed: Allergy & Precautions, H&P , NPO status , Patient's Chart, lab work & pertinent test results, reviewed documented beta blocker date and time   Airway Mallampati: III  TM Distance: >3 FB Neck ROM: full    Dental no notable dental hx. 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  Reproductive/Obstetrics negative OB ROS                             Anesthesia Physical Anesthesia Plan  ASA: II  Anesthesia Plan: MAC   Post-op Pain Management:    Induction:   PONV Risk Score and Plan:   Airway Management Planned:   Additional Equipment:   Intra-op Plan:   Post-operative Plan:   Informed Consent: I have reviewed the patients History and Physical, chart, labs and discussed the procedure including the risks, benefits and alternatives for the proposed anesthesia with the patient or authorized representative who has indicated his/her understanding and acceptance.   Dental Advisory Given  Plan Discussed with: CRNA  Anesthesia Plan Comments:         Anesthesia Quick Evaluation

## 2017-11-05 NOTE — Transfer of Care (Signed)
Immediate Anesthesia Transfer of Care Note  Patient: Katelyn [name]  Procedure(s) Performed: COLONOSCOPY WITH PROPOFOL (N/A ) POLYPECTOMY  Patient Location: PACU  Anesthesia Type:MAC  Level of Consciousness: awake and alert   Airway & Oxygen Therapy: Patient Spontanous Breathing  Post-op Assessment: Report given to RN  Post vital signs: Reviewed and stable  Last Vitals:  Vitals Value Taken Time  BP 83/41 11/05/2017 11:10 AM  Temp    Pulse 64 11/05/2017 11:13 AM  Resp 29 11/05/2017 11:13 AM  SpO2 97 % 11/05/2017 11:13 AM  Vitals shown include unvalidated device data.  Last Pain:  Vitals:   11/05/17 1046  TempSrc:   PainSc: 0-No pain      Patients Stated Pain Goal: 8 (33/61/22 [id number])  Complications: No apparent anesthesia complications

## 2017-11-05 NOTE — Op Note (Signed)
[facility] Patient Name: Katelyn [name] Procedure Date: 11/05/2017 10:33 AM MRN: [id number] Date of Birth: 1961/05/25 Attending MD: Norvel Richards , MD CSN: [id number] Age: 57 Admit Type: Outpatient Procedure:                Colonoscopy Indications:              High risk colon cancer surveillance: Personal                            history of colonic polyps Providers:                Norvel Richards, MD, Janeece Riggers, RN, Nelma Rothman, Technician Referring MD:              Medicines:                Propofol per Anesthesia Complications:            No immediate complications. Estimated Blood Loss:     Estimated blood loss: none. Procedure:                Pre-Anesthesia Assessment:                           - Prior to the procedure, a History and Physical                            was performed, and patient medications and                            allergies were reviewed. The patient's tolerance of                            previous anesthesia was also reviewed. The risks                            and benefits of the procedure and the sedation                            options and risks were discussed with the patient.                            All questions were answered, and informed consent                            was obtained. Prior Anticoagulants: The patient has                            taken no previous anticoagulant or antiplatelet                            agents. ASA Grade Assessment: II - A patient with  mild systemic disease. After reviewing the risks                            and benefits, the patient was deemed in                            satisfactory condition to undergo the procedure.                           After obtaining informed consent, the colonoscope                            was passed under direct vision. Throughout the                            procedure, the patient's  blood pressure, pulse, and                            oxygen saturations were monitored continuously. The                            EC-[id number] ([id number]) scope was introduced through                            the and advanced to the the cecum, identified by                            appendiceal orifice and ileocecal valve. The                            colonoscopy was performed without difficulty. The                            patient tolerated the procedure well. The quality                            of the bowel preparation was adequate. The                            ileocecal valve, appendiceal orifice, and rectum                            were photographed. The entire colon was well                            visualized. Scope In: 10:50:01 AM Scope Out: 11:04:54 AM Scope Withdrawal Time: 0 hours 10 minutes 6 seconds  Total Procedure Duration: 0 hours 14 minutes 53 seconds  Findings:      The perianal and digital rectal examinations were normal.      A 9 mm polyp was found in the splenic flexure. The polyp was sessile.       The polyp was removed with a hot snare. Resection and retrieval were       complete. Estimated blood loss: none.  The exam was otherwise without abnormality on direct and retroflexion       views. Impression:               - One 9 mm polyp at the splenic flexure, removed                            with a hot snare. Resected and retrieved.                           - The examination was otherwise normal on direct                            and retroflexion views. Moderate Sedation:      Moderate (conscious) sedation was personally administered by an       anesthesia professional. The following parameters were monitored: oxygen       saturation, heart rate, blood pressure, respiratory rate, EKG, adequacy       of pulmonary ventilation, and response to care. Total physician       intraservice time was 15 minutes. Recommendation:           - Patient has  a contact number available for                            emergencies. The signs and symptoms of potential                            delayed complications were discussed with the                            patient. Return to normal activities tomorrow.                            Written discharge instructions were provided to the                            patient.                           - Resume previous diet PRN.                           - Continue present medications.                           - Await pathology results.                           - Repeat colonoscopy date to be determined after                            pending pathology results are reviewed for                            surveillance.                           -  Return to GI office (date not yet determined). Procedure Code(s):        --- Professional ---                           [phone], Colonoscopy, flexible; with removal of                            tumor(s), polyp(s), or other lesion(s) by snare                            technique Diagnosis Code(s):        --- Professional ---                           Z86.010, Personal history of colonic polyps                           D12.3, Benign neoplasm of transverse colon (hepatic                            flexure or splenic flexure) CPT copyright 2017 [employer]. All rights reserved. The codes documented in this report are preliminary and upon coder review may  be revised to meet current compliance requirements. Cristopher Estimable. Kiing Deakin, MD Norvel Richards, MD 11/05/2017 11:11:23 AM This report has been signed electronically. Number of Addenda: 0

## 2017-11-05 NOTE — Addendum Note (Signed)
Addendum  created 11/05/17 1137 by Ollen Bowl, CRNA   Charge Capture section accepted

## 2017-11-05 NOTE — H&P (Signed)
_0 @   Primary Care Physician:  Redmond School, MD Primary Gastroenterologist:  Dr. Gala Romney  Pre-Procedure History & Physical: HPI:  Katelyn [name] is a 57 y.o. female here for  surveillance colonoscopy. History of colonic adenomas removed 5 years ago.  Past Medical History:  Diagnosis Date  . Degenerative disc disease, lumbar   . Depression   . GERD (gastroesophageal reflux disease)   . Goiter   . Hemorrhoids   . Hepatitis   . Hepatitis C infection 2004   cured with treatment of interferon and ribavirin  . Hypercholesteremia   . Hypertension   . Knee pain   . Panic attack   . PONV (postoperative nausea and vomiting)   . Seizures ([place])    had 1 seizure of unknown etiology and was put on no meds. has had no more seizures.  . Tubular adenoma 10/14/12  . Urinary tract infection 10/2017  . Varicose veins of both lower extremities with complications     Past Surgical History:  Procedure Laterality Date  . ABDOMINAL AORTOGRAM W/LOWER EXTREMITY N/A 03/28/2017   Procedure: ABDOMINAL AORTOGRAM W/LOWER EXTREMITY;  Surgeon: Waynetta Sandy, MD;  Location: [facility];  Service: Cardiovascular;  Laterality: N/A;  . BACK SURGERY    . BACK SURGERY  2015  . CARDIOPULMONARY EXERCISE TEST  08/26/2012   RER 0.94, Peak VO2 87%, HR 69% predicted, normal VO2 curves  . COLONOSCOPY N/A 10/14/2012   Dr.Ashlin Hidalgo-  normal rectum, 3 polyps in the cecum- the largest being approximately 5 mm in diameter. the remainder of the colonic mucosa appeared normal. bx= tubular adenoma  . FOOT SURGERY Right 2012  . FOOT SURGERY Right 2018  . HEMORRHOID BANDING    . NECK SURGERY  2005  . TONSILLECTOMY  1970   and adenoidectomy  . TOTAL SHOULDER ARTHROPLASTY  2011  . TRANSTHORACIC ECHOCARDIOGRAM  07/2012   EF 55-60%, mod LVH, mod conc LVH, RVSP increased (mild pulm HTN)  . TUBAL LIGATION  1988    Prior to Admission medications   Medication Sig Start Date End Date Taking? Authorizing Provider   aspirin EC 81 MG tablet Take 81 mg by mouth every evening.    Yes [provider]  atorvastatin (LIPITOR) 20 MG tablet Take 20 mg by mouth every evening.    Yes [provider]  calcium carbonate (OS-CAL - DOSED IN MG OF ELEMENTAL CALCIUM) 1250 (500 Ca) MG tablet Take 1 tablet by mouth every evening.   Yes [provider]  cholecalciferol (VITAMIN D) 1000 units tablet Take 1,000 Units by mouth every evening.   Yes [provider]  ciprofloxacin (CIPRO) 500 MG tablet Take 500 mg by mouth 2 (two) times daily.   Yes [provider]  FLUoxetine (PROZAC) 40 MG capsule Take 40 mg by mouth every evening.    Yes [provider]  furosemide (LASIX) 20 MG tablet Take 40 mg by mouth every evening.    Yes [provider]  gabapentin (NEURONTIN) 100 MG capsule Take 100 mg by mouth 3 (three) times daily.  08/18/17  Yes [provider]  ibuprofen (ADVIL,MOTRIN) 200 MG tablet Take 400-800 mg by mouth 3 (three) times daily as needed for headache or moderate pain.   Yes [provider]  linaclotide (LINZESS) 290 MCG CAPS capsule TAKE (1) CAPSULE BY MOUTH ONCE DAILY. Patient taking differently: TAKE (1) CAPSULE BY MOUTH ONCE DAILY IN THE EVENING 10/04/17  Yes Mahala Menghini, PA-C  lisinopril-hydrochlorothiazide (PRINZIDE,ZESTORETIC) 20-12.5 MG  tablet TAKE (1) TABLET BY MOUTH DAILY FOR HIGH BLOOD PRESSURE. Patient taking differently: Take 1 tablet by mouth once daily in the evening 02/04/16  Yes Hilty, Nadean Corwin, MD  Na Sulfate-K Sulfate-Mg Sulf 17.5-3.13-1.6 GM/177ML SOLN Take 1 kit by mouth as directed. 10/04/17  Yes Gualberto Wahlen, Cristopher Estimable, MD  oxybutynin (DITROPAN) 5 MG tablet Take 5 mg by mouth 3 (three) times daily.    Yes [provider]  potassium chloride (K-DUR,KLOR-CON) 10 MEQ tablet Take 1 tablet (10 mEq total) by mouth daily. NEEDS APPOINTMENT FOR FUTURE REFILLS Patient taking differently: Take 10 mEq by mouth every evening.  NEEDS APPOINTMENT FOR FUTURE REFILLS 05/22/16  Yes Hilty, Nadean Corwin, MD  SUBOXONE 8-2 MG FILM Take 0.5-1 Film by mouth See admin instructions. Take 1 film in the morning, 1 film in the afternoon, and 0.5 film at night 06/27/17  Yes [provider]  triamcinolone ointment (KENALOG) 0.5 % Apply 1 application topically as needed (rash).  09/12/17  Yes [provider]  loratadine (CLARITIN) 10 MG tablet Take 10 mg by mouth daily as needed for allergies.    [provider]  potassium chloride (K-DUR) 10 MEQ tablet Take 4 tablets (40 mEq total) by mouth 2 (two) times daily for 4 days. 10/29/17 11/02/17  Annitta Needs, NP    Allergies as of 10/04/2017 - Review Complete 10/04/2017  Allergen Reaction Noted  . Ancef [cefazolin] Swelling 02/06/2013  . Sulfa antibiotics Nausea And Vomiting and Rash 04/26/2011    Family History  Problem Relation Age of Onset  . Scoliosis Mother        also kidney infections  . Osteoarthritis Mother   . Glaucoma Mother   . Lung cancer Father   . Hepatitis C Sister        also OA  . Breast cancer Maternal Grandmother        and lymph cancer  . Kidney cancer Maternal Grandfather   . ADD / ADHD Child   . ADD / ADHD Child   . Bipolar disorder Child   . Colon cancer Neg Hx     Social History   Socioeconomic History  . Marital status: Divorced    Spouse name: Not on file  . Number of children: 2  . Years of education: 96  . Highest education level: Not on file  Occupational History  . Occupation: Designer, industrial/product  Social Needs  . Financial resource strain: Not on file  . Food insecurity:    Worry: Not on file    Inability: Not on file  . Transportation needs:    Medical: Not on file    Non-medical: Not on file  Tobacco Use  . Smoking status: Former Smoker    Packs/day: 2.00    Years: 35.00    Pack years: 70.00    Types: Cigarettes    Last attempt to quit: 07/03/2013    Years since quitting: 4.3  . Smokeless tobacco:  Never Used  . Tobacco comment: Quit x one month  Substance and Sexual Activity  . Alcohol use: Yes    Alcohol/week: 0.0 oz    Comment: RARELY  . Drug use: No  . Sexual activity: Never    Birth control/protection: Post-menopausal  Lifestyle  . Physical activity:    Days per week: Not on file    Minutes per session: Not on file  . Stress: Not on file  Relationships  . Social connections:    Talks on phone: Not on file  Gets together: Not on file    Attends religious service: Not on file    Active member of club or organization: Not on file    Attends meetings of clubs or organizations: Not on file    Relationship status: Not on file  . Intimate partner violence:    Fear of current or ex partner: Not on file    Emotionally abused: Not on file    Physically abused: Not on file    Forced sexual activity: Not on file  Other Topics Concern  . Not on file  Social History Narrative  . Not on file    Review of Systems: See HPI, otherwise negative ROS  Physical Exam: BP (!) 121/56   Pulse 61   Temp 97.7 F (36.5 C) (Oral)   Resp (!) 23   Ht _0  (1.626 m)   Wt 165 lb (74.8 kg)   SpO2 94%   BMI 28.32 kg/m  General:   Alert,  Well-developed, well-nourished, pleasant and cooperative in NAD Neck:  Supple; no masses or thyromegaly. No significant cervical adenopathy. Lungs:  Clear throughout to auscultation.   No wheezes, crackles, or rhonchi. No acute distress. Heart:  Regular rate and rhythm; no murmurs, clicks, rubs,  or gallops. Abdomen: Non-distended, normal bowel sounds.  Soft and nontender without appreciable mass or hepatosplenomegaly.  Pulses:  Normal pulses noted. Extremities:  Without clubbing or edema.  Impression:  Pleasant 57 year old lady with history of colonic adenoma. Here for surveillance colonoscopy  Recommendations:  I have offered the patient a surveillance colonoscopy today.  The risks, benefits, limitations, alternatives and imponderables have been  reviewed with the patient. Questions have been answered. All parties are agreeable.      Notice: This dictation was prepared with Dragon dictation along with smaller phrase technology. Any transcriptional errors that result from this process are unintentional and may not be corrected upon review.

## 2017-11-05 NOTE — Anesthesia Postprocedure Evaluation (Signed)
Anesthesia Post Note  Patient: Katelyn [name]  Procedure(s) Performed: COLONOSCOPY WITH PROPOFOL (N/A ) POLYPECTOMY  Patient location during evaluation: PACU Anesthesia Type: MAC Level of consciousness: awake and alert and oriented Pain management: pain level controlled Vital Signs Assessment: post-procedure vital signs reviewed and stable Respiratory status: spontaneous breathing Cardiovascular status: stable Postop Assessment: no apparent nausea or vomiting Anesthetic complications: no     Last Vitals:  Vitals:   11/05/17 1110 11/05/17 1115  BP: (!) 83/41   Pulse: (!) 59   Resp: (!) 24   Temp: 37 C   SpO2: 95% 95%    Last Pain:  Vitals:   11/05/17 1115  TempSrc:   PainSc: 0-No pain                 Klaira Pesci

## 2017-11-06 DIAGNOSIS — G4733 Obstructive sleep apnea (adult) (pediatric): Secondary | ICD-10-CM | POA: Diagnosis not present

## 2017-11-07 ENCOUNTER — Ambulatory Visit (INDEPENDENT_AMBULATORY_CARE_PROVIDER_SITE_OTHER): Payer: Medicare Other | Admitting: Orthotics

## 2017-11-07 DIAGNOSIS — M216X9 Other acquired deformities of unspecified foot: Secondary | ICD-10-CM

## 2017-11-07 DIAGNOSIS — M779 Enthesopathy, unspecified: Secondary | ICD-10-CM

## 2017-11-07 LAB — POCT I-STAT 4, (NA,K, GLUC, HGB,HCT)
Glucose, Bld: 97 mg/dL (ref 65–99)
HCT: 38 % (ref 36.0–46.0)
Hemoglobin: 12.9 g/dL (ref 12.0–15.0)
Potassium: 3.3 mmol/L — ABNORMAL LOW (ref 3.5–5.1)
Sodium: 142 mmol/L (ref 135–145)

## 2017-11-07 NOTE — Progress Notes (Signed)
Patient picked up f/o; however did not bring footwear in with them  She is going to get Dr. Gibson Ramp shoes.

## 2017-11-12 ENCOUNTER — Encounter (HOSPITAL_COMMUNITY): Payer: Self-pay | Admitting: Internal Medicine

## 2017-11-14 ENCOUNTER — Ambulatory Visit (INDEPENDENT_AMBULATORY_CARE_PROVIDER_SITE_OTHER): Payer: Medicare Other

## 2017-11-14 ENCOUNTER — Encounter: Payer: Self-pay | Admitting: Podiatry

## 2017-11-14 ENCOUNTER — Other Ambulatory Visit: Payer: Self-pay | Admitting: Podiatry

## 2017-11-14 ENCOUNTER — Ambulatory Visit (INDEPENDENT_AMBULATORY_CARE_PROVIDER_SITE_OTHER): Payer: Medicare Other | Admitting: Podiatry

## 2017-11-14 DIAGNOSIS — M779 Enthesopathy, unspecified: Secondary | ICD-10-CM | POA: Diagnosis not present

## 2017-11-14 DIAGNOSIS — S99921A Unspecified injury of right foot, initial encounter: Secondary | ICD-10-CM

## 2017-11-14 MED ORDER — TRIAMCINOLONE ACETONIDE 10 MG/ML IJ SUSP
10.0000 mg | Freq: Once | INTRAMUSCULAR | Status: AC
  Administered 2017-11-14: 10 mg

## 2017-11-14 NOTE — Progress Notes (Signed)
Subjective:   Patient ID: Katelyn [name], female   DOB: 57 y.o.   MRN: [id number]   HPI Patient presents worried that she may have broken her fifth toe and also has a lot of inflammation around the fifth metatarsal head of her left foot and knows eventually she may need surgery on this   ROS      Objective:  Physical Exam  Neurovascular status intact with patient noted to have a red fifth digit with swelling after injury 1 month ago with trauma and on the left fifth metatarsal there is inflammation and fluid buildup around the fifth metatarsal head with pain     Assessment:  Digital trauma fifth digit right with possible fracture and inflammatory tailor's bunion deformity left with capsulitis     Plan:  Reviewed both conditions and x-rays of the right foot.  For the left I did do sterile prep and then I injected carefully with 2 mg Dexasone Kenalog 5 mg Xylocaine to reduce the inflammation and discussed possible fifth metatarsal head resection in the future.  For the right one I went ahead and advised on wider shoes and padding therapy  X-ray was negative for signs of fracture of the right fifth digit

## 2017-11-16 ENCOUNTER — Encounter: Payer: Self-pay | Admitting: Internal Medicine

## 2017-11-17 ENCOUNTER — Encounter: Payer: Self-pay | Admitting: Internal Medicine

## 2017-11-30 DIAGNOSIS — I10 Essential (primary) hypertension: Secondary | ICD-10-CM | POA: Diagnosis not present

## 2017-11-30 DIAGNOSIS — E663 Overweight: Secondary | ICD-10-CM | POA: Diagnosis not present

## 2017-11-30 DIAGNOSIS — G4733 Obstructive sleep apnea (adult) (pediatric): Secondary | ICD-10-CM | POA: Diagnosis not present

## 2017-11-30 DIAGNOSIS — Z6827 Body mass index (BMI) 27.0-27.9, adult: Secondary | ICD-10-CM | POA: Diagnosis not present

## 2017-11-30 DIAGNOSIS — G2581 Restless legs syndrome: Secondary | ICD-10-CM | POA: Diagnosis not present

## 2017-11-30 DIAGNOSIS — R5383 Other fatigue: Secondary | ICD-10-CM | POA: Diagnosis not present

## 2017-12-04 ENCOUNTER — Other Ambulatory Visit (HOSPITAL_COMMUNITY): Payer: Self-pay | Admitting: Internal Medicine

## 2017-12-04 DIAGNOSIS — E049 Nontoxic goiter, unspecified: Secondary | ICD-10-CM

## 2017-12-14 ENCOUNTER — Ambulatory Visit (HOSPITAL_COMMUNITY)
Admission: RE | Admit: 2017-12-14 | Discharge: 2017-12-14 | Disposition: A | Discharge status: Home / Self Care | Payer: Medicare Other | Source: Ambulatory Visit | Attending: Internal Medicine | Admitting: Internal Medicine

## 2017-12-14 ENCOUNTER — Ambulatory Visit: Payer: Medicare Other | Admitting: Podiatry

## 2017-12-14 DIAGNOSIS — E049 Nontoxic goiter, unspecified: Secondary | ICD-10-CM | POA: Diagnosis not present

## 2017-12-14 DIAGNOSIS — E041 Nontoxic single thyroid nodule: Secondary | ICD-10-CM | POA: Diagnosis not present

## 2018-01-01 DIAGNOSIS — E039 Hypothyroidism, unspecified: Secondary | ICD-10-CM | POA: Diagnosis not present

## 2018-01-01 DIAGNOSIS — G2581 Restless legs syndrome: Secondary | ICD-10-CM | POA: Diagnosis not present

## 2018-01-01 DIAGNOSIS — E663 Overweight: Secondary | ICD-10-CM | POA: Diagnosis not present

## 2018-01-01 DIAGNOSIS — G4730 Sleep apnea, unspecified: Secondary | ICD-10-CM | POA: Diagnosis not present

## 2018-01-01 DIAGNOSIS — E785 Hyperlipidemia, unspecified: Secondary | ICD-10-CM | POA: Diagnosis not present

## 2018-01-01 DIAGNOSIS — G4700 Insomnia, unspecified: Secondary | ICD-10-CM | POA: Diagnosis not present

## 2018-01-01 DIAGNOSIS — I10 Essential (primary) hypertension: Secondary | ICD-10-CM | POA: Diagnosis not present

## 2018-01-01 DIAGNOSIS — G894 Chronic pain syndrome: Secondary | ICD-10-CM | POA: Diagnosis not present

## 2018-01-01 DIAGNOSIS — Z0001 Encounter for general adult medical examination with abnormal findings: Secondary | ICD-10-CM | POA: Diagnosis not present

## 2018-01-01 DIAGNOSIS — Z6827 Body mass index (BMI) 27.0-27.9, adult: Secondary | ICD-10-CM | POA: Diagnosis not present

## 2018-01-01 DIAGNOSIS — Z1389 Encounter for screening for other disorder: Secondary | ICD-10-CM | POA: Diagnosis not present

## 2018-01-03 ENCOUNTER — Other Ambulatory Visit: Payer: Self-pay

## 2018-01-03 ENCOUNTER — Emergency Department (HOSPITAL_COMMUNITY): Payer: Medicare Other

## 2018-01-03 ENCOUNTER — Encounter (HOSPITAL_COMMUNITY): Payer: Self-pay | Admitting: Emergency Medicine

## 2018-01-03 ENCOUNTER — Emergency Department (HOSPITAL_COMMUNITY)
Admission: EM | Admit: 2018-01-03 | Discharge: 2018-01-03 | Disposition: A | Discharge status: Home / Self Care | Payer: Medicare Other | Attending: Emergency Medicine | Admitting: Emergency Medicine

## 2018-01-03 DIAGNOSIS — Y998 Other external cause status: Secondary | ICD-10-CM | POA: Diagnosis not present

## 2018-01-03 DIAGNOSIS — I10 Essential (primary) hypertension: Secondary | ICD-10-CM | POA: Insufficient documentation

## 2018-01-03 DIAGNOSIS — Y929 Unspecified place or not applicable: Secondary | ICD-10-CM | POA: Diagnosis not present

## 2018-01-03 DIAGNOSIS — Z87891 Personal history of nicotine dependence: Secondary | ICD-10-CM | POA: Insufficient documentation

## 2018-01-03 DIAGNOSIS — S299XXA Unspecified injury of thorax, initial encounter: Secondary | ICD-10-CM | POA: Diagnosis not present

## 2018-01-03 DIAGNOSIS — E7800 Pure hypercholesterolemia, unspecified: Secondary | ICD-10-CM | POA: Diagnosis not present

## 2018-01-03 DIAGNOSIS — S20212A Contusion of left front wall of thorax, initial encounter: Secondary | ICD-10-CM | POA: Diagnosis not present

## 2018-01-03 DIAGNOSIS — Y939 Activity, unspecified: Secondary | ICD-10-CM | POA: Diagnosis not present

## 2018-01-03 DIAGNOSIS — R0781 Pleurodynia: Secondary | ICD-10-CM | POA: Diagnosis not present

## 2018-01-03 DIAGNOSIS — Z79899 Other long term (current) drug therapy: Secondary | ICD-10-CM | POA: Insufficient documentation

## 2018-01-03 DIAGNOSIS — Z7982 Long term (current) use of aspirin: Secondary | ICD-10-CM | POA: Diagnosis not present

## 2018-01-03 HISTORY — DX: Apnea, not elsewhere classified: R06.81

## 2018-01-03 MED ORDER — ACETAMINOPHEN 325 MG PO TABS
650.0000 mg | ORAL_TABLET | Freq: Once | ORAL | Status: DC
Start: 2018-01-03 — End: 2018-01-03

## 2018-01-03 MED ORDER — ACETAMINOPHEN 325 MG PO TABS: 650.0000 mg | ORAL_TABLET | Freq: Four times a day (QID) | ORAL | 0 refills | Status: DC | PRN

## 2018-01-03 MED ORDER — TRAMADOL HCL 50 MG PO TABS: 50.0000 mg | ORAL_TABLET | Freq: Four times a day (QID) | ORAL | 0 refills | Status: DC | PRN

## 2018-01-03 MED ORDER — IBUPROFEN 600 MG PO TABS: 600.0000 mg | ORAL_TABLET | Freq: Four times a day (QID) | ORAL | 0 refills | Status: AC | PRN
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
## 2018-01-03 NOTE — Discharge Instructions (Addendum)
Please use your incentive spirometer as directed.
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
## 2018-01-03 NOTE — ED Notes (Signed)
Resp paged for incentive spirometry.

## 2018-01-16 DIAGNOSIS — S2020XA Contusion of thorax, unspecified, initial encounter: Secondary | ICD-10-CM | POA: Diagnosis not present

## 2018-01-16 DIAGNOSIS — E663 Overweight: Secondary | ICD-10-CM | POA: Diagnosis not present

## 2018-01-16 DIAGNOSIS — Z6826 Body mass index (BMI) 26.0-26.9, adult: Secondary | ICD-10-CM | POA: Diagnosis not present

## 2018-01-16 DIAGNOSIS — Z1389 Encounter for screening for other disorder: Secondary | ICD-10-CM | POA: Diagnosis not present

## 2018-01-29 DIAGNOSIS — F419 Anxiety disorder, unspecified: Secondary | ICD-10-CM | POA: Diagnosis not present

## 2018-01-29 DIAGNOSIS — K219 Gastro-esophageal reflux disease without esophagitis: Secondary | ICD-10-CM | POA: Diagnosis not present

## 2018-01-29 DIAGNOSIS — Z6826 Body mass index (BMI) 26.0-26.9, adult: Secondary | ICD-10-CM | POA: Diagnosis not present

## 2018-01-29 DIAGNOSIS — G894 Chronic pain syndrome: Secondary | ICD-10-CM | POA: Diagnosis not present

## 2018-01-29 DIAGNOSIS — E663 Overweight: Secondary | ICD-10-CM | POA: Diagnosis not present

## 2018-01-29 DIAGNOSIS — E063 Autoimmune thyroiditis: Secondary | ICD-10-CM | POA: Diagnosis not present

## 2018-01-29 DIAGNOSIS — G4733 Obstructive sleep apnea (adult) (pediatric): Secondary | ICD-10-CM | POA: Diagnosis not present

## 2018-02-28 ENCOUNTER — Ambulatory Visit: Payer: Medicare Other | Admitting: Podiatry

## 2018-03-07 DIAGNOSIS — R600 Localized edema: Secondary | ICD-10-CM | POA: Diagnosis not present

## 2018-03-07 DIAGNOSIS — K219 Gastro-esophageal reflux disease without esophagitis: Secondary | ICD-10-CM | POA: Diagnosis not present

## 2018-03-07 DIAGNOSIS — E063 Autoimmune thyroiditis: Secondary | ICD-10-CM | POA: Diagnosis not present

## 2018-03-07 DIAGNOSIS — Z6826 Body mass index (BMI) 26.0-26.9, adult: Secondary | ICD-10-CM | POA: Diagnosis not present

## 2018-03-07 DIAGNOSIS — I10 Essential (primary) hypertension: Secondary | ICD-10-CM | POA: Diagnosis not present

## 2018-03-08 ENCOUNTER — Encounter: Payer: Self-pay | Admitting: Podiatry

## 2018-03-08 ENCOUNTER — Ambulatory Visit (INDEPENDENT_AMBULATORY_CARE_PROVIDER_SITE_OTHER): Payer: Medicare Other

## 2018-03-08 ENCOUNTER — Ambulatory Visit (INDEPENDENT_AMBULATORY_CARE_PROVIDER_SITE_OTHER): Payer: Medicare Other | Admitting: Podiatry

## 2018-03-08 ENCOUNTER — Other Ambulatory Visit: Payer: Self-pay | Admitting: Podiatry

## 2018-03-08 DIAGNOSIS — M779 Enthesopathy, unspecified: Secondary | ICD-10-CM

## 2018-03-08 DIAGNOSIS — M722 Plantar fascial fibromatosis: Secondary | ICD-10-CM

## 2018-03-08 DIAGNOSIS — M25572 Pain in left ankle and joints of left foot: Secondary | ICD-10-CM

## 2018-03-08 DIAGNOSIS — M25571 Pain in right ankle and joints of right foot: Secondary | ICD-10-CM

## 2018-03-08 MED ORDER — TRIAMCINOLONE ACETONIDE 10 MG/ML IJ SUSP
10.0000 mg | Freq: Once | INTRAMUSCULAR | Status: AC
  Administered 2018-03-08: 10 mg

## 2018-03-08 NOTE — Patient Instructions (Signed)

## 2018-03-08 NOTE — Progress Notes (Signed)
Subjective:   Patient ID: Katelyn [name], female   DOB: 57 y.o.   MRN: [id number]   HPI Patient presents with 2 distinct complaints with one being inflammation and pain of the bottom of the heels of both feet and the second being discomfort and pain on the outside of the right foot.  Patient states that the orthotics are difficult for her to wear for long periods of time and the pain we have taken care of earlier this year has resolved   ROS      Objective:  Physical Exam  Neurovascular status intact with inflammation pain of the plantar heel region bilateral with fluid buildup around the medial band and discomfort at the peroneal insertion base of fifth metatarsal right with inflammation fluid buildup     Assessment:  Acute plantar fasciitis bilateral with inflammation fluid buildup and what appears to be peroneal tendinitis right at the insertion of the tendon into the base of the fifth metatarsal     Plan:  H&P x-rays reviewed both conditions discussed.  At this point will get a focus on the plantar heels and we may need to move to the tendinitis even though I did advise on ice therapy.  I injected the plantar fascial bilateral 3 mg Kenalog 5 mg Xylocaine and applied fascial brace bilateral lift up the arch is and instructed on heat ice therapy.  Reappoint in 20 days and will also have Liliane Channel look at orthotics to see if they can be modified for  X-rays indicate that there is small plantar spur formation but no indications of stress fracture arthritis

## 2018-03-27 DIAGNOSIS — Z6826 Body mass index (BMI) 26.0-26.9, adult: Secondary | ICD-10-CM | POA: Diagnosis not present

## 2018-03-27 DIAGNOSIS — L509 Urticaria, unspecified: Secondary | ICD-10-CM | POA: Diagnosis not present

## 2018-03-27 DIAGNOSIS — E663 Overweight: Secondary | ICD-10-CM | POA: Diagnosis not present

## 2018-03-27 DIAGNOSIS — I10 Essential (primary) hypertension: Secondary | ICD-10-CM | POA: Diagnosis not present

## 2018-03-27 DIAGNOSIS — E063 Autoimmune thyroiditis: Secondary | ICD-10-CM | POA: Diagnosis not present

## 2018-03-27 DIAGNOSIS — R400 Somnolence: Secondary | ICD-10-CM | POA: Diagnosis not present

## 2018-03-28 ENCOUNTER — Ambulatory Visit: Payer: Medicare Other | Admitting: Podiatry

## 2018-03-28 ENCOUNTER — Other Ambulatory Visit: Payer: Medicare Other | Admitting: Orthotics

## 2018-04-29 DIAGNOSIS — Z23 Encounter for immunization: Secondary | ICD-10-CM | POA: Diagnosis not present

## 2018-05-06 ENCOUNTER — Encounter: Payer: Self-pay | Admitting: Internal Medicine

## 2018-06-18 ENCOUNTER — Other Ambulatory Visit: Payer: Self-pay

## 2018-06-18 DIAGNOSIS — I70211 Atherosclerosis of native arteries of extremities with intermittent claudication, right leg: Secondary | ICD-10-CM

## 2018-06-18 DIAGNOSIS — M79604 Pain in right leg: Secondary | ICD-10-CM

## 2018-06-18 DIAGNOSIS — M79605 Pain in left leg: Secondary | ICD-10-CM

## 2018-06-18 DIAGNOSIS — I739 Peripheral vascular disease, unspecified: Secondary | ICD-10-CM

## 2018-07-02 ENCOUNTER — Ambulatory Visit (HOSPITAL_COMMUNITY)
Admission: RE | Admit: 2018-07-02 | Discharge: 2018-07-02 | Disposition: A | Discharge status: Home / Self Care | Payer: Medicare Other | Source: Ambulatory Visit | Attending: Family | Admitting: Family

## 2018-07-02 ENCOUNTER — Encounter: Payer: Self-pay | Admitting: Family

## 2018-07-02 ENCOUNTER — Ambulatory Visit (INDEPENDENT_AMBULATORY_CARE_PROVIDER_SITE_OTHER): Payer: Medicare Other | Admitting: Family

## 2018-07-02 ENCOUNTER — Other Ambulatory Visit: Payer: Self-pay

## 2018-07-02 VITALS — BP 126/71 | HR 66 | Temp 98.1°F | Resp 20 | Ht 65.0 in | Wt 169.8 lb

## 2018-07-02 DIAGNOSIS — M79604 Pain in right leg: Secondary | ICD-10-CM | POA: Diagnosis not present

## 2018-07-02 DIAGNOSIS — I70211 Atherosclerosis of native arteries of extremities with intermittent claudication, right leg: Secondary | ICD-10-CM | POA: Insufficient documentation

## 2018-07-02 DIAGNOSIS — I739 Peripheral vascular disease, unspecified: Secondary | ICD-10-CM | POA: Insufficient documentation

## 2018-07-02 DIAGNOSIS — M79605 Pain in left leg: Secondary | ICD-10-CM | POA: Diagnosis not present

## 2018-07-02 DIAGNOSIS — I779 Disorder of arteries and arterioles, unspecified: Secondary | ICD-10-CM

## 2018-07-02 NOTE — Progress Notes (Signed)
[facility] [place]   CC: Follow up peripheral artery occlusive disease  History of Present Illness Katelyn [name] is a 57 y.o. female who is s/p angiogram with bilateral leg run off by Dr. Donzetta Matters on 03-28-17.  Findings: The aortoiliac segments are free of disease that is flow-limiting. Right SFA is occluded at its origin and reconstitutes midthigh via profunda collateralization. There is three-vessel runoff to the feet bilaterally. There is no discernible flow limiting disease on the left side.   She had cramping in her right lower extremity both with and without walking for some time. She also has had multiple foot surgeries which causes her pain in her right lower extremity.  She fell and hit her head twice two days ago, on 06-30-18. Pt states she was not medically evaluated for this. I advised her to notify her PCP of this today, and let Dr. Nolon Rod office advise her. If she cannot reach Dr. Nolon Rod office, she needs to be evaluated in an Emergency Dept..  She states she has a headache at the back of her head, where she hit the same place on her head twice. She denies vision changes or dizziness. No racoon eyes. She is holding her neck stiffly.    She states she has an orthotic being devised for her right foot, walks in her house a great deal otherwise.   She denies any known history of stroke orT IA.  Either calf will get tired feeling after walking about 1/4 mile, but standing makes her calves feel more tired.   Diabetic: No Tobacco use: former smoker, quit in 2014, smoked x 35 years, 2 ppd  Pt meds include: Statin :Yes Betablocker: No ASA: Yes Other anticoagulants/antiplatelets: no  Past Medical History:  Diagnosis Date  . Apnea   . Degenerative disc disease, lumbar   . Depression   . GERD (gastroesophageal reflux disease)   . Goiter   . Hemorrhoids   . Hepatitis   . Hepatitis C infection 2004   cured with treatment of interferon and ribavirin  .  Hypercholesteremia   . Hypertension   . Knee pain   . Panic attack   . PONV (postoperative nausea and vomiting)   . Seizures ([place])    had 1 seizure of unknown etiology and was put on no meds. has had no more seizures.  . Tubular adenoma 10/14/12  . Urinary tract infection 10/2017  . Varicose veins of both lower extremities with complications     Social History Social History   Tobacco Use  . Smoking status: Former Smoker    Packs/day: 2.00    Years: 35.00    Pack years: 70.00    Types: Cigarettes    Last attempt to quit: 07/03/2013    Years since quitting: 5.0  . Smokeless tobacco: Never Used  . Tobacco comment: Quit x one month  Substance Use Topics  . Alcohol use: Yes    Alcohol/week: 0.0 standard drinks    Comment: RARELY  . Drug use: No    Family History Family History  Problem Relation Age of Onset  . Scoliosis Mother        also kidney infections  . Osteoarthritis Mother   . Glaucoma Mother   . Lung cancer Father   . Hepatitis C Sister        also OA  . Breast cancer Maternal Grandmother        and lymph cancer  . Kidney cancer Maternal Grandfather   .  ADD / ADHD Child   . ADD / ADHD Child   . Bipolar disorder Child   . Colon cancer Neg Hx     Past Surgical History:  Procedure Laterality Date  . ABDOMINAL AORTOGRAM W/LOWER EXTREMITY N/A 03/28/2017   Procedure: ABDOMINAL AORTOGRAM W/LOWER EXTREMITY;  Surgeon: Waynetta Sandy, MD;  Location: [facility];  Service: Cardiovascular;  Laterality: N/A;  . BACK SURGERY    . BACK SURGERY  2015  . CARDIOPULMONARY EXERCISE TEST  08/26/2012   RER 0.94, Peak VO2 87%, HR 69% predicted, normal VO2 curves  . COLONOSCOPY N/A 10/14/2012   Dr.Rourk-  normal rectum, 3 polyps in the cecum- the largest being approximately 5 mm in diameter. the remainder of the colonic mucosa appeared normal. bx= tubular adenoma  . COLONOSCOPY WITH PROPOFOL N/A 11/05/2017   Procedure: COLONOSCOPY WITH PROPOFOL;  Surgeon: Daneil Dolin, MD;  [facility];  Service: Endoscopy;  Laterality: N/A;  10:00am  . FOOT SURGERY Right 2012  . FOOT SURGERY Right 2018  . HEMORRHOID BANDING    . NECK SURGERY  2005  . POLYPECTOMY  11/05/2017   Procedure: POLYPECTOMY;  Surgeon: Daneil Dolin, MD;  [facility]: [facility];  Service: Endoscopy;;  splenic flexure polyp hs,  . TONSILLECTOMY  1970   and adenoidectomy  . TOTAL SHOULDER ARTHROPLASTY  2011  . TRANSTHORACIC ECHOCARDIOGRAM  07/2012   EF 55-60%, mod LVH, mod conc LVH, RVSP increased (mild pulm HTN)  . TUBAL LIGATION  1988    Allergies  Allergen Reactions  . Ancef [Cefazolin] Swelling    Pt had vague constriction of throat post op. Not sure it was abx. But could be so proceed cautiously with cephalosporins   . Sulfa Antibiotics Nausea And Vomiting and Rash    Current Outpatient Medications  Medication Sig Dispense Refill  . acetaminophen (TYLENOL) 325 MG tablet Take 2 tablets (650 mg total) by mouth every 6 (six) hours as needed. 30 tablet 0  . aspirin EC 81 MG tablet Take 81 mg by mouth every evening.     Marland Kitchen atorvastatin (LIPITOR) 40 MG tablet Take 40 mg by mouth daily.    . calcium carbonate (OS-CAL - DOSED IN MG OF ELEMENTAL CALCIUM) 1250 (500 Ca) MG tablet Take 1 tablet by mouth every evening.    . cholecalciferol (VITAMIN D) 1000 units tablet Take 1,000 Units by mouth every evening.    . enalapril-hydrochlorothiazide (VASERETIC) 10-25 MG tablet   11  . FLUoxetine (PROZAC) 40 MG capsule Take 40 mg by mouth every evening.     . furosemide (LASIX) 20 MG tablet Take 40 mg by mouth every evening.     . gabapentin (NEURONTIN) 100 MG capsule Take 100 mg by mouth 3 (three) times daily.     Marland Kitchen ibuprofen (ADVIL,MOTRIN) 600 MG tablet Take 1 tablet (600 mg total) by mouth every 6 (six) hours as needed. 30 tablet 0  . linaclotide (LINZESS) 290 MCG CAPS capsule TAKE (1) CAPSULE BY MOUTH ONCE DAILY. (Patient taking differently: TAKE (1) CAPSULE BY MOUTH ONCE DAILY IN  THE EVENING) 90 capsule 3  . loratadine (CLARITIN) 10 MG tablet Take 10 mg by mouth daily as needed for allergies.    . Na Sulfate-K Sulfate-Mg Sulf 17.5-3.13-1.6 GM/177ML SOLN Take 1 kit by mouth as directed. 1 Bottle 0  . oxybutynin (DITROPAN) 5 MG tablet Take 5 mg by mouth 3 (three) times daily.     . SUBOXONE 8-2 MG FILM  Take 0.5-1 Film by mouth See admin instructions. Take 1 film in the morning, 1 film in the afternoon, and 0.5 film at night    . lisinopril-hydrochlorothiazide (PRINZIDE,ZESTORETIC) 20-12.5 MG tablet TAKE (1) TABLET BY MOUTH DAILY FOR HIGH BLOOD PRESSURE. (Patient not taking: Reported on 07/02/2018) 15 tablet 0   No current facility-administered medications for this visit.     ROS: See HPI for pertinent positives and negatives.   Physical Examination  Vitals:   07/02/18 1538  BP: 126/71  Pulse: 66  Resp: 20  Temp: 98.1 F (36.7 C)  SpO2: 96%  Weight: 169 lb 12.8 oz (77 kg)  Height: _0  (1.651 m)   Body mass index is 28.26 kg/m.  General: A&O x 3, WDWN, female. Gait: normal HENT: No gross abnormalities. No racoon eyes.  Eyes: PERRLA. Pulmonary: Respirations are non labored, CTAB, good air movement in all fields Cardiac: regular rhythm, no detected murmur.         Carotid Bruits Right Left   Negative Negative   Radial pulses are 2+ palpable bilaterally   Adominal aortic pulse is not palpable                         VASCULAR EXAM: Extremities without ischemic changes, without Gangrene; without open wounds.                                                                                                          LE Pulses Right Left       FEMORAL  1+ palpable  2+ palpable        POPLITEAL  not palpable   not palpable       POSTERIOR TIBIAL  not palpable   1+ palpable        DORSALIS PEDIS      ANTERIOR TIBIAL not palpable  not palpable    Abdomen: soft, NT, no palpable masses. Skin: no rashes, no cellulitis, no ulcers  noted. Musculoskeletal: no muscle wasting or atrophy. Limited rotation of neck.  Neurologic: A&O X 3; appropriate affect, Sensation is normal; MOTOR FUNCTION:  moving all extremities equally, motor strength 5/5 throughout. Speech is fluent/normal. CN 2-12 intact. Psychiatric: Thought content is normal, mood appropriate for clinical situation.     ASSESSMENT: Katelyn [name] is a 56 y.o. female who is s/p angiogram with bilateral leg run off by Dr. Donzetta Matters on 03-28-17.  Findings: The aortoiliac segments are free of disease that is flow-limiting. Right SFA is occluded at its origin and reconstitutes midthigh via profunda collateralization. There is three-vessel runoff to the feet bilaterally. There is no discernible flow limiting disease on the left side.   Her walking has been limited due to her feet issues, she will be obtaining a foot orthotic soon which she hopes will enable her to walk more.    She fell and hit her head twice two days ago, on 06-30-18. Pt states she was not medically evaluated for this. I advised her to notify her PCP of this today, and let  Dr. Nolon Rod office advise her. If she cannot reach Dr. Nolon Rod office, she needs to be evaluated in an Emergency Dept..  She states she has a headache at the back of her head, where she hit the same place on her head twice. She denies vision changes or dizziness.     DATA  ABI (Date: 07/02/2018):  R:   ABI: 0.81 (was 0.89 on 06-29-17),   PT: mono  DP: tri  TBI:  0.45, toe pressure 61, (was 0.77)  L:   ABI: 0.99 (was 0.94),   PT: tri  DP: tri  TBI: 0.47, toe pressure 65, (was 0.70) ABI with slight decline in the right with mild disease, mono and triphasic waveforms. Left ABI slightly improved, normal with triphasic waveforms. Decline in bilateral TBI.    PLAN:  Based on the patient's vascular studies and examination, pt will return to clinic in 1 year with ABI's.  I advised her to gradualy increase her walking to a  total of 30 minutes daily in a safe environment.   I discussed in depth with the patient the nature of atherosclerosis, and emphasized the importance of maximal medical management including strict control of blood pressure, blood glucose, and lipid levels, obtaining regular exercise, and continued cessation of smoking.  The patient is aware that without maximal medical management the underlying atherosclerotic disease process will progress, limiting the benefit of any interventions.  The patient was given information about PAD including signs, symptoms, treatment, what symptoms should prompt the patient to seek immediate medical care, and risk reduction measures to take.  Clemon Chambers, RN, MSN, FNP-C [facility] Phone: [phone]  Clinic MD: Early  07/02/18 3:44 PM

## 2018-07-02 NOTE — Patient Instructions (Signed)

## 2018-07-03 ENCOUNTER — Ambulatory Visit (HOSPITAL_COMMUNITY)
Admission: RE | Admit: 2018-07-03 | Discharge: 2018-07-03 | Disposition: A | Discharge status: Home / Self Care | Payer: Medicare Other | Source: Ambulatory Visit | Attending: Family Medicine | Admitting: Family Medicine

## 2018-07-03 ENCOUNTER — Other Ambulatory Visit (HOSPITAL_COMMUNITY): Payer: Self-pay | Admitting: Family Medicine

## 2018-07-03 DIAGNOSIS — S0001XA Abrasion of scalp, initial encounter: Secondary | ICD-10-CM | POA: Diagnosis not present

## 2018-07-03 DIAGNOSIS — Z681 Body mass index (BMI) 19 or less, adult: Secondary | ICD-10-CM | POA: Diagnosis not present

## 2018-07-03 DIAGNOSIS — S0990XA Unspecified injury of head, initial encounter: Secondary | ICD-10-CM

## 2018-07-03 DIAGNOSIS — Z1389 Encounter for screening for other disorder: Secondary | ICD-10-CM | POA: Diagnosis not present

## 2018-07-03 DIAGNOSIS — R55 Syncope and collapse: Secondary | ICD-10-CM | POA: Diagnosis not present

## 2018-07-03 DIAGNOSIS — R51 Headache: Secondary | ICD-10-CM | POA: Diagnosis not present

## 2018-07-09 ENCOUNTER — Other Ambulatory Visit (HOSPITAL_COMMUNITY): Payer: Self-pay | Admitting: Family Medicine

## 2018-07-09 DIAGNOSIS — I6523 Occlusion and stenosis of bilateral carotid arteries: Secondary | ICD-10-CM

## 2018-07-09 DIAGNOSIS — R55 Syncope and collapse: Secondary | ICD-10-CM

## 2018-07-09 DIAGNOSIS — I739 Peripheral vascular disease, unspecified: Secondary | ICD-10-CM

## 2018-07-12 ENCOUNTER — Ambulatory Visit (HOSPITAL_COMMUNITY)
Admission: RE | Admit: 2018-07-12 | Discharge: 2018-07-12 | Disposition: A | Discharge status: Home / Self Care | Payer: Medicare Other | Source: Ambulatory Visit | Attending: Family Medicine | Admitting: Family Medicine

## 2018-07-12 DIAGNOSIS — I739 Peripheral vascular disease, unspecified: Secondary | ICD-10-CM | POA: Insufficient documentation

## 2018-07-12 DIAGNOSIS — R55 Syncope and collapse: Secondary | ICD-10-CM | POA: Insufficient documentation

## 2018-07-12 DIAGNOSIS — I6523 Occlusion and stenosis of bilateral carotid arteries: Secondary | ICD-10-CM | POA: Diagnosis not present

## 2018-07-25 DIAGNOSIS — S0990XA Unspecified injury of head, initial encounter: Secondary | ICD-10-CM | POA: Diagnosis not present

## 2018-07-25 DIAGNOSIS — Z6829 Body mass index (BMI) 29.0-29.9, adult: Secondary | ICD-10-CM | POA: Diagnosis not present

## 2018-07-25 DIAGNOSIS — E063 Autoimmune thyroiditis: Secondary | ICD-10-CM | POA: Diagnosis not present

## 2018-07-25 DIAGNOSIS — R55 Syncope and collapse: Secondary | ICD-10-CM | POA: Diagnosis not present

## 2018-07-25 DIAGNOSIS — G4733 Obstructive sleep apnea (adult) (pediatric): Secondary | ICD-10-CM | POA: Diagnosis not present

## 2018-07-25 DIAGNOSIS — I10 Essential (primary) hypertension: Secondary | ICD-10-CM | POA: Diagnosis not present

## 2018-07-25 DIAGNOSIS — G894 Chronic pain syndrome: Secondary | ICD-10-CM | POA: Diagnosis not present

## 2018-07-25 DIAGNOSIS — R600 Localized edema: Secondary | ICD-10-CM | POA: Diagnosis not present

## 2018-07-25 DIAGNOSIS — F329 Major depressive disorder, single episode, unspecified: Secondary | ICD-10-CM | POA: Diagnosis not present

## 2018-07-30 ENCOUNTER — Other Ambulatory Visit: Payer: Self-pay | Admitting: Internal Medicine

## 2018-07-30 ENCOUNTER — Other Ambulatory Visit (HOSPITAL_COMMUNITY): Payer: Self-pay | Admitting: Internal Medicine

## 2018-07-30 DIAGNOSIS — M79604 Pain in right leg: Secondary | ICD-10-CM

## 2018-07-30 DIAGNOSIS — M79605 Pain in left leg: Principal | ICD-10-CM

## 2018-07-30 DIAGNOSIS — I739 Peripheral vascular disease, unspecified: Secondary | ICD-10-CM

## 2018-07-31 ENCOUNTER — Ambulatory Visit (INDEPENDENT_AMBULATORY_CARE_PROVIDER_SITE_OTHER): Payer: Medicare Other | Admitting: Podiatry

## 2018-07-31 ENCOUNTER — Encounter: Payer: Self-pay | Admitting: Podiatry

## 2018-07-31 DIAGNOSIS — M7751 Other enthesopathy of right foot: Secondary | ICD-10-CM | POA: Diagnosis not present

## 2018-07-31 DIAGNOSIS — M722 Plantar fascial fibromatosis: Secondary | ICD-10-CM

## 2018-07-31 DIAGNOSIS — M779 Enthesopathy, unspecified: Secondary | ICD-10-CM

## 2018-07-31 MED ORDER — TRIAMCINOLONE ACETONIDE 10 MG/ML IJ SUSP
10.0000 mg | Freq: Once | INTRAMUSCULAR | Status: AC
  Administered 2018-07-31: 10 mg

## 2018-07-31 NOTE — Progress Notes (Signed)
Subjective:   Patient ID: Katelyn [name], female   DOB: 58 y.o.   MRN: [id number]   HPI Patient presents with a lot of pain underneath the first MPJ right and in the plantar heel left stating both of them but become quite sore over the last several weeks and does not remember specific injury   ROS      Objective:  Physical Exam  Neurovascular status intact with patient found to have inflammation of the plantar capsule of the right first MPJ and discomfort when I palpated the plantar heel left with inflammation fluid buildup     Assessment:  Acute first MPJ plantar capsulitis right along with plantar fascial symptomatology left     Plan:  Sterile prep of both areas and I injected the first MPJ plantar right 3 mg Kenalog Xylocaine and debrided the lesion that was part of this and on the left I injected the plantar fascia 3 mg Kenalog 5 mg Xylocaine and I will see the patient back as needed

## 2018-08-01 ENCOUNTER — Other Ambulatory Visit (HOSPITAL_COMMUNITY): Payer: Self-pay | Admitting: Internal Medicine

## 2018-08-01 DIAGNOSIS — M79604 Pain in right leg: Secondary | ICD-10-CM

## 2018-08-01 DIAGNOSIS — I739 Peripheral vascular disease, unspecified: Secondary | ICD-10-CM

## 2018-08-01 DIAGNOSIS — M79605 Pain in left leg: Principal | ICD-10-CM

## 2018-08-02 ENCOUNTER — Ambulatory Visit (HOSPITAL_COMMUNITY)
Admission: RE | Admit: 2018-08-02 | Discharge: 2018-08-02 | Disposition: A | Discharge status: Home / Self Care | Payer: Medicare Other | Source: Ambulatory Visit | Attending: Internal Medicine | Admitting: Internal Medicine

## 2018-08-02 DIAGNOSIS — M79604 Pain in right leg: Secondary | ICD-10-CM | POA: Diagnosis not present

## 2018-08-02 DIAGNOSIS — M79605 Pain in left leg: Secondary | ICD-10-CM | POA: Insufficient documentation

## 2018-08-02 DIAGNOSIS — I739 Peripheral vascular disease, unspecified: Secondary | ICD-10-CM | POA: Insufficient documentation

## 2018-08-02 DIAGNOSIS — M79662 Pain in left lower leg: Secondary | ICD-10-CM | POA: Diagnosis not present

## 2018-08-02 DIAGNOSIS — M79661 Pain in right lower leg: Secondary | ICD-10-CM | POA: Diagnosis not present

## 2018-08-13 ENCOUNTER — Other Ambulatory Visit: Payer: Medicare Other | Admitting: Orthotics

## 2018-08-22 ENCOUNTER — Encounter: Payer: Self-pay | Admitting: Cardiology

## 2018-08-22 ENCOUNTER — Ambulatory Visit (INDEPENDENT_AMBULATORY_CARE_PROVIDER_SITE_OTHER): Payer: Medicare Other | Admitting: Cardiology

## 2018-08-22 VITALS — BP 124/66 | HR 56 | Ht 65.0 in | Wt 171.0 lb

## 2018-08-22 DIAGNOSIS — R55 Syncope and collapse: Secondary | ICD-10-CM | POA: Diagnosis not present

## 2018-08-22 NOTE — Patient Instructions (Signed)
Medication Instructions:  Your physician recommends that you continue on your current medications as directed. Please refer to the Current Medication list given to you today.   Labwork: none  Testing/Procedures: Your physician has requested that you have an echocardiogram. Echocardiography is a painless test that uses sound waves to create images of your heart. It provides your doctor with information about the size and shape of your heart and how well your heart's chambers and valves are working. This procedure takes approximately one hour. There are no restrictions for this procedure.     Follow-Up: Your physician recommends that you schedule a follow-up appointment in: 6 weeks    Any Other Special Instructions Will Be Listed Below (If Applicable).     If you need a refill on your cardiac medications before your next appointment, please call your pharmacy.

## 2018-08-22 NOTE — Progress Notes (Signed)
Clinical Summary Ms. Vidovich is a 58 y.o.female last seen by Dr Debara Pickett in 2015, seen today as a new patient for the following medical problems.   1. Syncope - episode about 6 weeks ago. Around 2AM pouring coffee, next thing on the floor. No prior symptoms. Hit back of her head was bleeding - no recurrent episodes.  - denies palpitaitons.  - reports history of sleep walking, but those episodes are quite different. Typically goes to bed, when she wakes up is in a different location in the house.     2. HTN - compliant with meds    3. PAD - history of PAD, followed by vascular - Jan 2020 normal ABIs  4. OSA   Past Medical History:  Diagnosis Date  . Apnea   . Degenerative disc disease, lumbar   . Depression   . GERD (gastroesophageal reflux disease)   . Goiter   . Hemorrhoids   . Hepatitis   . Hepatitis C infection 2004   cured with treatment of interferon and ribavirin  . Hypercholesteremia   . Hypertension   . Knee pain   . Panic attack   . PONV (postoperative nausea and vomiting)   . Seizures ([place])    had 1 seizure of unknown etiology and was put on no meds. has had no more seizures.  . Tubular adenoma 10/14/12  . Urinary tract infection 10/2017  . Varicose veins of both lower extremities with complications      Allergies  Allergen Reactions  . Ancef [Cefazolin] Swelling    Pt had vague constriction of throat post op. Not sure it was abx. But could be so proceed cautiously with cephalosporins   . Sulfa Antibiotics Nausea And Vomiting and Rash     Current Outpatient Medications  Medication Sig Dispense Refill  . acetaminophen (TYLENOL) 325 MG tablet Take 2 tablets (650 mg total) by mouth every 6 (six) hours as needed. 30 tablet 0  . aspirin EC 81 MG tablet Take 81 mg by mouth every evening.     Marland Kitchen atorvastatin (LIPITOR) 40 MG tablet Take 40 mg by mouth daily.    . calcium carbonate (OS-CAL - DOSED IN MG OF ELEMENTAL CALCIUM) 1250 (500 Ca) MG tablet  Take 1 tablet by mouth every evening.    . cholecalciferol (VITAMIN D) 1000 units tablet Take 1,000 Units by mouth every evening.    . clonazePAM (KLONOPIN) 1 MG tablet   4  . enalapril-hydrochlorothiazide (VASERETIC) 10-25 MG tablet   11  . FLUoxetine (PROZAC) 40 MG capsule Take 40 mg by mouth every evening.     . furosemide (LASIX) 20 MG tablet Take 40 mg by mouth every evening.     . gabapentin (NEURONTIN) 100 MG capsule Take 100 mg by mouth 3 (three) times daily.     Marland Kitchen gabapentin (NEURONTIN) 300 MG capsule   4  . ibuprofen (ADVIL,MOTRIN) 600 MG tablet Take 1 tablet (600 mg total) by mouth every 6 (six) hours as needed. 30 tablet 0  . linaclotide (LINZESS) 290 MCG CAPS capsule TAKE (1) CAPSULE BY MOUTH ONCE DAILY. (Patient taking differently: TAKE (1) CAPSULE BY MOUTH ONCE DAILY IN THE EVENING) 90 capsule 3  . lisinopril-hydrochlorothiazide (PRINZIDE,ZESTORETIC) 20-12.5 MG tablet TAKE (1) TABLET BY MOUTH DAILY FOR HIGH BLOOD PRESSURE. 15 tablet 0  . loratadine (CLARITIN) 10 MG tablet Take 10 mg by mouth daily as needed for allergies.    . Na Sulfate-K Sulfate-Mg Sulf 17.5-3.13-1.6 GM/177ML SOLN Take  1 kit by mouth as directed. 1 Bottle 0  . oxybutynin (DITROPAN) 5 MG tablet Take 5 mg by mouth 3 (three) times daily.     . SUBOXONE 8-2 MG FILM Take 0.5-1 Film by mouth See admin instructions. Take 1 film in the morning, 1 film in the afternoon, and 0.5 film at night     No current facility-administered medications for this visit.      Past Surgical History:  Procedure Laterality Date  . ABDOMINAL AORTOGRAM W/LOWER EXTREMITY N/A 03/28/2017   Procedure: ABDOMINAL AORTOGRAM W/LOWER EXTREMITY;  Surgeon: Waynetta Sandy, MD;  Location: [facility];  Service: Cardiovascular;  Laterality: N/A;  . BACK SURGERY    . BACK SURGERY  2015  . CARDIOPULMONARY EXERCISE TEST  08/26/2012   RER 0.94, Peak VO2 87%, HR 69% predicted, normal VO2 curves  . COLONOSCOPY N/A 10/14/2012   Dr.Rourk-   normal rectum, 3 polyps in the cecum- the largest being approximately 5 mm in diameter. the remainder of the colonic mucosa appeared normal. bx= tubular adenoma  . COLONOSCOPY WITH PROPOFOL N/A 11/05/2017   Procedure: COLONOSCOPY WITH PROPOFOL;  Surgeon: Daneil Dolin, MD;  [facility];  Service: Endoscopy;  Laterality: N/A;  10:00am  . FOOT SURGERY Right 2012  . FOOT SURGERY Right 2018  . HEMORRHOID BANDING    . NECK SURGERY  2005  . POLYPECTOMY  11/05/2017   Procedure: POLYPECTOMY;  Surgeon: Daneil Dolin, MD;  [facility];  Service: Endoscopy;;  splenic flexure polyp hs,  . TONSILLECTOMY  1970   and adenoidectomy  . TOTAL SHOULDER ARTHROPLASTY  2011  . TRANSTHORACIC ECHOCARDIOGRAM  07/2012   EF 55-60%, mod LVH, mod conc LVH, RVSP increased (mild pulm HTN)  . TUBAL LIGATION  1988     Allergies  Allergen Reactions  . Ancef [Cefazolin] Swelling    Pt had vague constriction of throat post op. Not sure it was abx. But could be so proceed cautiously with cephalosporins   . Sulfa Antibiotics Nausea And Vomiting and Rash      Family History  Problem Relation Age of Onset  . Scoliosis Mother        also kidney infections  . Osteoarthritis Mother   . Glaucoma Mother   . Lung cancer Father   . Hepatitis C Sister        also OA  . Breast cancer Maternal Grandmother        and lymph cancer  . Kidney cancer Maternal Grandfather   . ADD / ADHD Child   . ADD / ADHD Child   . Bipolar disorder Child   . Colon cancer Neg Hx      Social History Ms. Santiago reports that she quit smoking about 5 years ago. Her smoking use included cigarettes. She has a 70.00 pack-year smoking history. She has never used smokeless tobacco. Ms. Barron reports current alcohol use.   Review of Systems CONSTITUTIONAL: No weight loss, fever, chills, weakness or fatigue.  HEENT: Eyes: No visual loss, blurred vision, double vision or yellow sclerae.No hearing loss, sneezing,  congestion, runny nose or sore throat.  SKIN: No rash or itching.  CARDIOVASCULAR: per hpi RESPIRATORY: No shortness of breath, cough or sputum.  GASTROINTESTINAL: No anorexia, nausea, vomiting or diarrhea. No abdominal pain or blood.  GENITOURINARY: No burning on urination, no polyuria NEUROLOGICAL: No headache, dizziness, syncope, paralysis, ataxia, numbness or tingling in the extremities. No change in bowel or bladder control.  MUSCULOSKELETAL: No muscle, back pain, joint pain or stiffness.  LYMPHATICS: No enlarged nodes. No history of splenectomy.  PSYCHIATRIC: No history of depression or anxiety.  ENDOCRINOLOGIC: No reports of sweating, cold or heat intolerance. No polyuria or polydipsia.  Marland Kitchen   Physical Examination Vitals:   08/22/18 0901  BP: 124/66  Pulse: (!) 56  SpO2: 94%   Vitals:   08/22/18 0901  Weight: 171 lb (77.6 kg)  Height: 5' 5"  (1.651 m)    Gen: resting comfortably, no acute distress HEENT: no scleral icterus, pupils equal round and reactive, no palptable cervical adenopathy,  CV: RRR, no mr/,g no jvd Resp: Clear to auscultation bilaterally GI: abdomen is soft, non-tender, non-distended, normal bowel sounds, no hepatosplenomegaly MSK: extremities are warm, no edema.  Skin: warm, no rash Neuro:  no focal deficits Psych: appropriate affect   Diagnostic Studies  06/2018 carotid US IMPRESSION: Less than 50% stenosis in the right and left internal carotid Arteries.  Jan 2020 ABIs IMPRESSION: No significant arterial occlusive disease in the lower extremities.   Jan 2014 echo Study Conclusions  - Left ventricle: The cavity size was normal. Wall thickness was increased in a pattern of moderate LVH. There was moderate concentric hypertrophy. Systolic function was normal. The estimated ejection fraction was in the range of 55% to 60%. Wall motion was normal; there were no regional wall motion abnormalities. - Aortic valve: Trileaflet;  mildly thickened leaflets. - Right ventricle: Systolic pressure was increased. - Atrial septum: No defect or patent foramen ovale was identified. Impressions:  - The right ventricular systolic pressure was increased consistent with mild pulmonary hypertension.   Assessment and Plan  1. Syncope - unclear etiology - we will obtain an echo to evaluate for underlying structural heart disease - pending results, likely obtain 21 day event monitor.  - orthostatics are negative in clinic today.       Arnoldo Lenis, M.D.

## 2018-08-23 DIAGNOSIS — Z6828 Body mass index (BMI) 28.0-28.9, adult: Secondary | ICD-10-CM | POA: Diagnosis not present

## 2018-08-23 DIAGNOSIS — G2581 Restless legs syndrome: Secondary | ICD-10-CM | POA: Diagnosis not present

## 2018-08-23 DIAGNOSIS — R600 Localized edema: Secondary | ICD-10-CM | POA: Diagnosis not present

## 2018-08-23 DIAGNOSIS — I10 Essential (primary) hypertension: Secondary | ICD-10-CM | POA: Diagnosis not present

## 2018-08-23 DIAGNOSIS — G4733 Obstructive sleep apnea (adult) (pediatric): Secondary | ICD-10-CM | POA: Diagnosis not present

## 2018-08-27 ENCOUNTER — Ambulatory Visit (HOSPITAL_COMMUNITY)
Admission: RE | Admit: 2018-08-27 | Discharge: 2018-08-27 | Disposition: A | Discharge status: Home / Self Care | Payer: Medicare Other | Source: Ambulatory Visit | Attending: Cardiology | Admitting: Cardiology

## 2018-08-27 DIAGNOSIS — R55 Syncope and collapse: Secondary | ICD-10-CM | POA: Diagnosis not present

## 2018-08-27 NOTE — Progress Notes (Signed)
*  PRELIMINARY RESULTS* Echocardiogram 2D Echocardiogram has been performed.  Katelyn [name] 08/27/2018, 11:24 AM

## 2018-08-30 DIAGNOSIS — H5203 Hypermetropia, bilateral: Secondary | ICD-10-CM | POA: Diagnosis not present

## 2018-08-30 DIAGNOSIS — H52223 Regular astigmatism, bilateral: Secondary | ICD-10-CM | POA: Diagnosis not present

## 2018-08-30 DIAGNOSIS — H524 Presbyopia: Secondary | ICD-10-CM | POA: Diagnosis not present

## 2018-09-03 ENCOUNTER — Telehealth: Payer: Self-pay

## 2018-09-03 DIAGNOSIS — R42 Dizziness and giddiness: Secondary | ICD-10-CM

## 2018-09-03 DIAGNOSIS — R55 Syncope and collapse: Secondary | ICD-10-CM

## 2018-09-03 NOTE — Telephone Encounter (Signed)
Pt aware of echo results, will await call from [employer] for 21 day event , enrolled today

## 2018-09-03 NOTE — Telephone Encounter (Signed)
-----   Message from Arnoldo Lenis, MD sent at 09/02/2018  4:21 PM EST ----- Normal echo, can we order a 21 day event monitor for dizziness and syncope   Zandra Abts MD

## 2018-09-09 ENCOUNTER — Ambulatory Visit (INDEPENDENT_AMBULATORY_CARE_PROVIDER_SITE_OTHER): Payer: Medicare Other

## 2018-09-09 DIAGNOSIS — R55 Syncope and collapse: Secondary | ICD-10-CM | POA: Diagnosis not present

## 2018-09-09 DIAGNOSIS — R42 Dizziness and giddiness: Secondary | ICD-10-CM

## 2018-09-12 ENCOUNTER — Other Ambulatory Visit: Payer: Self-pay | Admitting: Gastroenterology

## 2018-10-04 ENCOUNTER — Other Ambulatory Visit: Payer: Self-pay

## 2018-10-07 DIAGNOSIS — H109 Unspecified conjunctivitis: Secondary | ICD-10-CM | POA: Diagnosis not present

## 2018-10-07 DIAGNOSIS — E663 Overweight: Secondary | ICD-10-CM | POA: Diagnosis not present

## 2018-10-07 DIAGNOSIS — Z1389 Encounter for screening for other disorder: Secondary | ICD-10-CM | POA: Diagnosis not present

## 2018-10-07 DIAGNOSIS — Z6828 Body mass index (BMI) 28.0-28.9, adult: Secondary | ICD-10-CM | POA: Diagnosis not present

## 2018-10-08 ENCOUNTER — Encounter (HOSPITAL_COMMUNITY): Payer: Self-pay | Admitting: Emergency Medicine

## 2018-10-08 ENCOUNTER — Emergency Department (HOSPITAL_COMMUNITY)
Admission: EM | Admit: 2018-10-08 | Discharge: 2018-10-08 | Disposition: A | Discharge status: Home / Self Care | Payer: Medicare Other | Attending: Emergency Medicine | Admitting: Emergency Medicine

## 2018-10-08 ENCOUNTER — Other Ambulatory Visit: Payer: Self-pay

## 2018-10-08 DIAGNOSIS — Z87891 Personal history of nicotine dependence: Secondary | ICD-10-CM | POA: Insufficient documentation

## 2018-10-08 DIAGNOSIS — Z7982 Long term (current) use of aspirin: Secondary | ICD-10-CM | POA: Diagnosis not present

## 2018-10-08 DIAGNOSIS — Y9301 Activity, walking, marching and hiking: Secondary | ICD-10-CM | POA: Diagnosis not present

## 2018-10-08 DIAGNOSIS — Y999 Unspecified external cause status: Secondary | ICD-10-CM | POA: Diagnosis not present

## 2018-10-08 DIAGNOSIS — Y929 Unspecified place or not applicable: Secondary | ICD-10-CM | POA: Diagnosis not present

## 2018-10-08 DIAGNOSIS — Z96619 Presence of unspecified artificial shoulder joint: Secondary | ICD-10-CM | POA: Diagnosis not present

## 2018-10-08 DIAGNOSIS — S0502XA Injury of conjunctiva and corneal abrasion without foreign body, left eye, initial encounter: Secondary | ICD-10-CM | POA: Diagnosis not present

## 2018-10-08 DIAGNOSIS — Z79899 Other long term (current) drug therapy: Secondary | ICD-10-CM | POA: Diagnosis not present

## 2018-10-08 DIAGNOSIS — I10 Essential (primary) hypertension: Secondary | ICD-10-CM | POA: Insufficient documentation

## 2018-10-08 DIAGNOSIS — W010XXA Fall on same level from slipping, tripping and stumbling without subsequent striking against object, initial encounter: Secondary | ICD-10-CM | POA: Insufficient documentation

## 2018-10-08 DIAGNOSIS — S0592XA Unspecified injury of left eye and orbit, initial encounter: Secondary | ICD-10-CM | POA: Diagnosis present

## 2018-10-08 MED ORDER — TETRACAINE HCL 0.5 % OP SOLN
OPHTHALMIC | Status: AC
  Administered 2018-10-08: 2 [drp] via OPHTHALMIC
  Filled 2018-10-08: qty 4

## 2018-10-08 MED ORDER — TETRACAINE HCL 0.5 % OP SOLN
2.0000 [drp] | Freq: Once | OPHTHALMIC | Status: AC
  Administered 2018-10-08: 2 [drp] via OPHTHALMIC

## 2018-10-08 MED ORDER — KETOROLAC TROMETHAMINE 0.5 % OP SOLN
1.0000 [drp] | Freq: Once | OPHTHALMIC | Status: AC
  Administered 2018-10-08: 1 [drp] via OPHTHALMIC
  Filled 2018-10-08: qty 5

## 2018-10-08 MED ORDER — FLUORESCEIN SODIUM 1 MG OP STRP
1.0000 | ORAL_STRIP | Freq: Once | OPHTHALMIC | Status: AC
  Administered 2018-10-08: 1 via OPHTHALMIC

## 2018-10-08 MED ORDER — TOBRAMYCIN 0.3 % OP SOLN
1.0000 [drp] | OPHTHALMIC | Status: DC
  Administered 2018-10-08: 1 [drp] via OPHTHALMIC
  Filled 2018-10-08: qty 5

## 2018-10-08 MED ORDER — FLUORESCEIN SODIUM 1 MG OP STRP
ORAL_STRIP | OPHTHALMIC | Status: AC
  Administered 2018-10-08: 1 via OPHTHALMIC
  Filled 2018-10-08: qty 1

## 2018-10-08 NOTE — ED Triage Notes (Signed)
Pain to LT eye after hitting on bed post Sunday.  Seen at [facility] was given eye drops.  Pt reports she is having a lot of pain.

## 2018-10-08 NOTE — Discharge Instructions (Addendum)
Avoid eyestrain such as reading, driving, or computer use.  You may apply a cold compress on and off to your eye.  Stop the Polytrim.  Apply 1 drop of the tobramycin to your left eye every 4 hours and 1 drop of the ketorolac 4 times a day as needed for pain.  Use the tobramycin drops for 5 to 7 days.  You may contact the eye doctor listed to arrange a follow-up appointment if needed.

## 2018-10-09 NOTE — ED Provider Notes (Signed)
[facility] EMERGENCY DEPARTMENT Provider Note   CSN: [id number] Arrival date & time: 10/08/18  1124    History   Chief Complaint Chief Complaint  Patient presents with   Eye Pain    HPI Katelyn [name] is a 58 y.o. female.     HPI   Katelyn [name] is a 58 y.o. female who presents to the Emergency Department complaining of pain and photophobia of the left eye.  She states that she tripped and grazed her left eye on a piece of wood.  Incident occurred two days ago.  She was seen by her PCP for this and given polytrim drops.  She comes here requesting further evaluation due to persistent symptoms and no improvement of the pain.  She describes having sharp pain to her eye with swelling of her upper eyelid, excessive tearing blurred vision and photophobia. She has difficulty keeping her eye open. She denies headaches, dizziness, facial pain.  She does not wear glasses or contacts.     Past Medical History:  Diagnosis Date   Apnea    Degenerative disc disease, lumbar    Depression    GERD (gastroesophageal reflux disease)    Goiter    Hemorrhoids    Hepatitis    Hepatitis C infection 2004   cured with treatment of interferon and ribavirin   Hypercholesteremia    Hypertension    Knee pain    Panic attack    PONV (postoperative nausea and vomiting)    Seizures ([place])    had 1 seizure of unknown etiology and was put on no meds. has had no more seizures.   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   Bilateral leg pain 05/22/2016   Hemorrhoids 05/12/2014   HTN (hypertension) 09/03/2013   Peripheral neuropathy 09/03/2013   Leg edema 09/03/2013   Status post lumbar spinal fusion  05/07/2013   ARF (acute renal failure) ([place]) 02/05/2013   Hyponatremia 02/05/2013   Abdominal pain 02/05/2013   TOBACCO ABUSE 05/07/2009   LEG PAIN, BILATERAL 05/07/2009   HEPATITIS C 05/04/2009   DEPRESSION 05/04/2009   ACID REFLUX DISEASE 05/04/2009   UTI 05/04/2009   EDEMA LEG 05/04/2009    Past Surgical History:  Procedure Laterality Date   ABDOMINAL AORTOGRAM W/LOWER EXTREMITY N/A 03/28/2017   Procedure: ABDOMINAL AORTOGRAM W/LOWER EXTREMITY;  Surgeon: Waynetta Sandy, MD;  Location: [facility];  Service: Cardiovascular;  Laterality: N/A;   BACK SURGERY     BACK SURGERY  2015   CARDIOPULMONARY EXERCISE TEST  08/26/2012   RER 0.94, Peak VO2 87%, HR 69% predicted, normal VO2 curves   COLONOSCOPY N/A 10/14/2012   Dr.Rourk-  normal rectum, 3 polyps in the cecum- the largest being approximately 5 mm in diameter. the remainder of the colonic mucosa appeared normal. bx= tubular adenoma   COLONOSCOPY WITH PROPOFOL N/A 11/05/2017   Procedure: COLONOSCOPY WITH PROPOFOL;  Surgeon: Daneil Dolin, MD;  [facility];  Service: Endoscopy;  Laterality: N/A;  10:00am   FOOT SURGERY Right 2012   FOOT SURGERY Right 2018   HEMORRHOID BANDING     NECK SURGERY  2005   POLYPECTOMY  11/05/2017   Procedure: POLYPECTOMY;  Surgeon: Daneil Dolin, MD;  [facility];  Service: Endoscopy;;  splenic flexure polyp hs,   TONSILLECTOMY  1970   and adenoidectomy   TOTAL SHOULDER ARTHROPLASTY  2011   TRANSTHORACIC ECHOCARDIOGRAM  07/2012   EF 55-60%, mod LVH, mod conc LVH, RVSP increased (mild pulm HTN)   TUBAL LIGATION  1988     OB History    Gravida  2   Para  2   Term  1   Preterm  1   AB      Living  2     SAB      TAB      Ectopic      Multiple      Live Births               Home Medications    Prior to Admission medications   Medication Sig Start Date End Date Taking? Authorizing Provider  acetaminophen  (TYLENOL) 325 MG tablet Take 2 tablets (650 mg total) by mouth every 6 (six) hours as needed. 01/03/18   Delia Heady, PA-C  aspirin EC 81 MG tablet Take 81 mg by mouth every evening.     [provider]  atorvastatin (LIPITOR) 40 MG tablet Take 40 mg by mouth daily.    [provider]  calcium carbonate (OS-CAL - DOSED IN MG OF ELEMENTAL CALCIUM) 1250 (500 Ca) MG tablet Take 1 tablet by mouth every evening.    [provider]  cholecalciferol (VITAMIN D) 1000 units tablet Take 1,000 Units by mouth every evening.    [provider]  enalapril-hydrochlorothiazide (VASERETIC) 10-25 MG tablet  06/17/18   [provider]  FLUoxetine (PROZAC) 40 MG capsule Take 40 mg by mouth every evening.     [provider]  furosemide (LASIX) 20 MG tablet Take 40 mg by mouth every evening.     [provider]  gabapentin (NEURONTIN) 100 MG capsule Take 100 mg by mouth 3 (three) times daily.  08/18/17   [provider]  gabapentin (NEURONTIN) 300 MG capsule  05/20/18   [provider]  ibuprofen (ADVIL,MOTRIN) 600 MG tablet Take 1 tablet (600 mg total) by mouth every 6 (six) hours as needed. 01/03/18   Khatri, Nicanor Alcon, PA-C  linaclotide (LINZESS) 290 MCG CAPS capsule TAKE (1) CAPSULE BY MOUTH ONCE DAILY IN THE EVENING 09/12/18   Carlis Stable, NP  Na Sulfate-K Sulfate-Mg Sulf 17.5-3.13-1.6 GM/177ML SOLN Take 1 kit by mouth as directed. 10/04/17   Rourk, Cristopher Estimable, MD  oxybutynin (DITROPAN) 5 MG tablet Take 5 mg by mouth 3 (three) times daily.     [provider]  SUBOXONE 8-2 MG FILM Take 0.5-1 Film by mouth See admin instructions. Take 1 film in the morning, 1 film in the afternoon, and 0.5 film at night 06/27/17   [provider]    Family History Family History  Problem Relation Age of Onset   Scoliosis Mother        also kidney infections   Osteoarthritis Mother    Glaucoma Mother    Lung cancer Father    Hepatitis  C Sister        also OA   Breast cancer Maternal Grandmother        and lymph cancer   Kidney cancer Maternal Grandfather    ADD / ADHD Child    ADD / ADHD Child    Bipolar disorder Child    Colon cancer Neg Hx     Social History  Social History   Tobacco Use   Smoking status: Former Smoker    Packs/day: 2.00    Years: 35.00    Pack years: 70.00    Types: Cigarettes    Last attempt to quit: 07/03/2013    Years since quitting: 5.2   Smokeless tobacco: Never Used   Tobacco comment: Quit x one month  Substance Use Topics   Alcohol use: Not Currently    Alcohol/week: 0.0 standard drinks    Comment: RARELY   Drug use: No     Allergies   Ancef [cefazolin] and Sulfa antibiotics   Review of Systems Review of Systems  Constitutional: Negative for chills and fever.  HENT: Negative for ear pain.   Eyes: Positive for photophobia, pain and visual disturbance.       Tearing of the left eye  Respiratory: Negative for shortness of breath.   Cardiovascular: Negative for chest pain.  Musculoskeletal: Negative for neck pain and neck stiffness.  Skin: Negative for rash.  Neurological: Negative for dizziness, weakness, numbness and headaches.     Physical Exam Updated Vital Signs BP 134/74 (BP Location: Right Arm)    Pulse 80    Temp 98 F (36.7 C) (Oral)    Resp 18    Ht '5\' 5"'$  (1.651 m)    Wt 76.7 kg    SpO2 97%    BMI 28.12 kg/m   Physical Exam Vitals signs and nursing note reviewed.  Constitutional:      General: She is not in acute distress.    Appearance: Normal appearance.  Eyes:     General: Lids are everted, no foreign bodies appreciated. Vision grossly intact. Gaze aligned appropriately.        Left eye: No foreign body, discharge or hordeolum.     Extraocular Movements: Extraocular movements intact.     Conjunctiva/sclera:     Left eye: Left conjunctiva is injected. No chemosis or hemorrhage.    Pupils: Pupils are equal, round, and reactive to light.       Left eye: Corneal abrasion and fluorescein uptake present. Seidel exam negative.    Funduscopic exam:       Left eye: No papilledema.     Slit lamp exam:    Left eye: No corneal ulcer, foreign body or hyphema.     Comments: Two small corneal abrasions, one at the 12 o clock and one at the 9 o clock positions.  Globe appears intact.  No FB's.   Neurological:     Mental Status: She is alert.      ED Treatments / Results  Labs (all labs ordered are listed, but only abnormal results are displayed) Labs Reviewed - No data to display  EKG None  Radiology No results found.  Procedures Procedures (including critical care time)  Medications Ordered in ED Medications  fluorescein ophthalmic strip 1 strip (1 strip Left Eye Given 10/08/18 1231)  tetracaine (PONTOCAINE) 0.5 % ophthalmic solution 2 drop (2 drops Right Eye Given 10/08/18 1230)  ketorolac (ACULAR) 0.5 % ophthalmic solution 1 drop (1 drop Left Eye Given 10/08/18 1334)     Initial Impression / Assessment and Plan / ED Course  I have reviewed the triage vital signs and the nursing notes.  Pertinent labs & imaging results that were available during my care of the patient were reviewed by me and considered in my medical decision making (see chart for details).          Visual Acuity  Right  Eye Distance: 20/40 Left Eye Distance: 20/40 Bilateral Distance: 20/40  Pt is feeling much improved after exam.  Two corneal abrasions of left eye.  No evidence of penetrating trauma.   Pt dispensed tobramycin and ketorolac drops.  Agrees to cool compresses.  Referral info given for ophthalmology.  Return precautions discussed   Final Clinical Impressions(s) / ED Diagnoses   Final diagnoses:  Abrasion of left cornea, initial encounter    ED Discharge Orders    None       Bufford Lope 10/09/18 2119    Isla Pence, MD 10/11/18 [phone]

## 2018-10-10 ENCOUNTER — Telehealth: Payer: Self-pay

## 2018-10-10 NOTE — Telephone Encounter (Signed)
Called pt. Informed her of monitor results. She voiced understanding.

## 2018-10-10 NOTE — Telephone Encounter (Signed)
-----   Message from Arnoldo Lenis, MD sent at 10/10/2018 12:29 PM EDT ----- Normal heart monitor without any significant abnormal rhythms. F/u 3 months   Zandra Abts MD

## 2018-10-15 ENCOUNTER — Ambulatory Visit: Payer: Medicare Other | Admitting: Cardiology

## 2018-12-13 ENCOUNTER — Ambulatory Visit (INDEPENDENT_AMBULATORY_CARE_PROVIDER_SITE_OTHER): Payer: Medicare Other | Admitting: Podiatry

## 2018-12-13 ENCOUNTER — Encounter: Payer: Self-pay | Admitting: Podiatry

## 2018-12-13 ENCOUNTER — Other Ambulatory Visit: Payer: Self-pay

## 2018-12-13 VITALS — Temp 97.0°F

## 2018-12-13 DIAGNOSIS — M779 Enthesopathy, unspecified: Secondary | ICD-10-CM | POA: Diagnosis not present

## 2018-12-13 MED ORDER — TRIAMCINOLONE ACETONIDE 10 MG/ML IJ SUSP
10.0000 mg | Freq: Once | INTRAMUSCULAR | Status: AC
  Administered 2018-12-13: 10 mg

## 2018-12-13 NOTE — Progress Notes (Signed)
Subjective:   Patient ID: Katelyn [name], female   DOB: 58 y.o.   MRN: [id number]   HPI Patient presents stating of a lot of pain in both feet with my right being around the joint and the left being on the outside of the foot.  Patient states it is been going on for a few months   ROS      Objective:  Physical Exam  Neurovascular status unchanged with inflammation of the fourth MPJ right with patient found to have inflammation of the outside of the fifth metatarsal left with fluid buildup noted     Assessment:  H&P condition reviewed and discussed and today I did sterile prep of each foot and injected the capsule of the fourth MPJ right 2 mg dexamethasone 5 mg Xylocaine and the fifth MPJ left 2 mg Dexasone Kenalog 3 mg Xylocaine.  Patient will be seen back as needed and I debrided lesion right to try to give relief     Plan:  Reviewed capsulitis condition patient is experiencing

## 2018-12-23 DIAGNOSIS — G2581 Restless legs syndrome: Secondary | ICD-10-CM | POA: Diagnosis not present

## 2018-12-23 DIAGNOSIS — I10 Essential (primary) hypertension: Secondary | ICD-10-CM | POA: Diagnosis not present

## 2018-12-23 DIAGNOSIS — M1991 Primary osteoarthritis, unspecified site: Secondary | ICD-10-CM | POA: Diagnosis not present

## 2018-12-23 DIAGNOSIS — G4733 Obstructive sleep apnea (adult) (pediatric): Secondary | ICD-10-CM | POA: Diagnosis not present

## 2018-12-23 DIAGNOSIS — F17210 Nicotine dependence, cigarettes, uncomplicated: Secondary | ICD-10-CM | POA: Diagnosis not present

## 2018-12-23 DIAGNOSIS — Z6827 Body mass index (BMI) 27.0-27.9, adult: Secondary | ICD-10-CM | POA: Diagnosis not present

## 2018-12-23 DIAGNOSIS — G4730 Sleep apnea, unspecified: Secondary | ICD-10-CM | POA: Diagnosis not present

## 2018-12-24 ENCOUNTER — Other Ambulatory Visit: Payer: Medicare Other | Admitting: Orthotics

## 2018-12-25 ENCOUNTER — Telehealth: Payer: Self-pay | Admitting: Cardiology

## 2018-12-25 NOTE — Telephone Encounter (Signed)

## 2018-12-27 ENCOUNTER — Encounter: Payer: Self-pay | Admitting: Cardiology

## 2018-12-27 ENCOUNTER — Telehealth (INDEPENDENT_AMBULATORY_CARE_PROVIDER_SITE_OTHER): Payer: Medicare Other | Admitting: Cardiology

## 2018-12-27 VITALS — Ht 65.0 in | Wt 160.0 lb

## 2018-12-27 DIAGNOSIS — Z9181 History of falling: Secondary | ICD-10-CM | POA: Diagnosis not present

## 2018-12-27 DIAGNOSIS — W19XXXD Unspecified fall, subsequent encounter: Secondary | ICD-10-CM

## 2018-12-27 NOTE — Progress Notes (Signed)
Virtual Visit via Telephone Note   This visit type was conducted due to national recommendations for restrictions regarding the COVID-19 Pandemic (e.g. social distancing) in an effort to limit this patient's exposure and mitigate transmission in our community.  Due to her co-morbid illnesses, this patient is at least at moderate risk for complications without adequate follow up.  This format is felt to be most appropriate for this patient at this time.  The patient did not have access to video technology/had technical difficulties with video requiring transitioning to audio format only (telephone).  All issues noted in this document were discussed and addressed.  No physical exam could be performed with this format.  Please refer to the patient's chart for her  consent to telehealth for [facility].   Date:  12/27/2018   ID:  Katelyn [name], DOB 1961-05-16, MRN [id number]  Patient Location: Home Provider Location: Home  PCP:  Redmond School, MD  Cardiologist:  Carlyle Dolly, MD  Electrophysiologist:  None   Evaluation Performed:  Follow-Up Visit  Chief Complaint:  4 month follow up  History of Present Illness:    Katelyn [name] is a 58 y.o. female seen today for follow up of the following medical problems.   1. Falls - episode about 6 weeks ago. Around 2AM pouring coffee, next thing on the floor. No prior symptoms. Hit back of her head was bleeding - no recurrent episodes.  - denies palpitaitons.  - reports history of sleep walking, but those episodes are quite different. Typically goes to bed, when she wakes up is in a different location in the house.     08/2018 echo normal LVEF, no significant valve dysfunction 09/2018 21 day event monitor: no significant arrhythmias - ongoing falls that always seem to be sleep related, she is seeing a sleep specialist.       The patient does not have symptoms concerning for COVID-19 infection (fever, chills, cough, or new  shortness of breath).    Past Medical History:  Diagnosis Date  . Apnea   . Degenerative disc disease, lumbar   . Depression   . GERD (gastroesophageal reflux disease)   . Goiter   . Hemorrhoids   . Hepatitis   . Hepatitis C infection 2004   cured with treatment of interferon and ribavirin  . Hypercholesteremia   . Hypertension   . Knee pain   . Panic attack   . PONV (postoperative nausea and vomiting)   . Seizures ([place])    had 1 seizure of unknown etiology and was put on no meds. has had no more seizures.  . Tubular adenoma 10/14/12  . Urinary tract infection 10/2017  . Varicose veins of both lower extremities with complications    Past Surgical History:  Procedure Laterality Date  . ABDOMINAL AORTOGRAM W/LOWER EXTREMITY N/A 03/28/2017   Procedure: ABDOMINAL AORTOGRAM W/LOWER EXTREMITY;  Surgeon: Waynetta Sandy, MD;  Location: [place] [facility];  Service: Cardiovascular;  Laterality: N/A;  . BACK SURGERY    . BACK SURGERY  2015  . CARDIOPULMONARY EXERCISE TEST  08/26/2012   RER 0.94, Peak VO2 87%, HR 69% predicted, normal VO2 curves  . COLONOSCOPY N/A 10/14/2012   Dr.Rourk-  normal rectum, 3 polyps in the cecum- the largest being approximately 5 mm in diameter. the remainder of the colonic mucosa appeared normal. bx= tubular adenoma  . COLONOSCOPY WITH PROPOFOL N/A 11/05/2017   Procedure: COLONOSCOPY WITH PROPOFOL;  Surgeon: Daneil Dolin, MD;  [facility];  Service: Endoscopy;  Laterality: N/A;  10:00am  . FOOT SURGERY Right 2012  . FOOT SURGERY Right 2018  . HEMORRHOID BANDING    . NECK SURGERY  2005  . POLYPECTOMY  11/05/2017   Procedure: POLYPECTOMY;  Surgeon: Daneil Dolin, MD;  [facility];  Service: Endoscopy;;  splenic flexure polyp hs,  . TONSILLECTOMY  1970   and adenoidectomy  . TOTAL SHOULDER ARTHROPLASTY  2011  . TRANSTHORACIC ECHOCARDIOGRAM  07/2012   EF 55-60%, mod LVH, mod conc LVH, RVSP increased (mild pulm HTN)  . TUBAL  LIGATION  1988     No outpatient medications have been marked as taking for the 12/27/18 encounter (Appointment) with Arnoldo Lenis, MD.     Allergies:   Ancef [cefazolin] and Sulfa antibiotics   Social History   Tobacco Use  . Smoking status: Former Smoker    Packs/day: 2.00    Years: 35.00    Pack years: 70.00    Types: Cigarettes    Last attempt to quit: 07/03/2013    Years since quitting: 5.4  . Smokeless tobacco: Never Used  . Tobacco comment: Quit x one month  Substance Use Topics  . Alcohol use: Not Currently    Alcohol/week: 0.0 standard drinks    Comment: RARELY  . Drug use: No     Family Hx: The patient's family history includes ADD / ADHD in her child and child; Bipolar disorder in her child; Breast cancer in her maternal grandmother; Glaucoma in her mother; Hepatitis C in her sister; Kidney cancer in her maternal grandfather; Lung cancer in her father; Osteoarthritis in her mother; Scoliosis in her mother. There is no history of Colon cancer.  ROS:   Please see the history of present illness.     All other systems reviewed and are negative.   Prior CV studies:   The following studies were reviewed today:  06/2018 carotid US IMPRESSION: Less than 50% stenosis in the right and left internal carotid Arteries.  Jan 2020 ABIs IMPRESSION: No significant arterial occlusive disease in the lower extremities.   Jan 2014 echo Study Conclusions  - Left ventricle: The cavity size was normal. Wall thickness was increased in a pattern of moderate LVH. There was moderate concentric hypertrophy. Systolic function was normal. The estimated ejection fraction was in the range of 55% to 60%. Wall motion was normal; there were no regional wall motion abnormalities. - Aortic valve: Trileaflet; mildly thickened leaflets. - Right ventricle: Systolic pressure was increased. - Atrial septum: No defect or patent foramen ovale was identified.  Impressions:  - The right ventricular systolic pressure was increased consistent with mild pulmonary hypertension.  08/2018 echo IMPRESSIONS    1. The left ventricle has normal systolic function of 37-16%. The cavity size is normal. There is no increased left ventricular wall thickness. Echo evidence of impaired relaxation diastolic filling pattern.  2. The right ventricle is normal in size. There is no increase in right ventricular wall thickness. There is normal systolic function. RVSP estimated 33 mmHg.  3. Moderately dilated left atrial size.  4. Normal right atrial size.  5. The mitral valve is normal in structure.  6. The tricuspid valve is normal in structure.  7. The aortic valve is tricuspid in structure.  8. The pulmonic valve is grossly normal.  9. The aortic root is normal in size and structure. 10. No atrial level shunt detected by color flow Doppler.   09/2018 event monitor  21  day event monitor  Min HR 50, Max HR 131, Avg HR 68  Reported symptoms correlate with sinus rhythm  No significant arrhythmias Labs/Other Tests and Data Reviewed:    EKG:  No ECG reviewed.  Recent Labs: No results found for requested labs within last 8760 hours.   Recent Lipid Panel Lab Results  Component Value Date/Time   HDL 55 09/29/2008   LDLCALC 145 09/29/2008    Wt Readings from Last 3 Encounters:  10/08/18 169 lb (76.7 kg)  08/22/18 171 lb (77.6 kg)  07/02/18 169 lb 12.8 oz (77 kg)     Objective:    Vital Signs:   Today's Vitals   12/27/18 0845  Weight: 160 lb (72.6 kg)  Height: 5\' 5"  (1.651 m)   Body mass index is 26.63 kg/m.  Normal affect. Normal speech pattern and tone. Comfortable, no apparent distress. No audible signs of SOB or wheezing.  ASSESSMENT & PLAN:    1. Falls - negative cardiac workup with normal orthostaitcs, echo, and heart monitor - episodes always seem to be sleep related, she is following with a sleep medicine specialist - no  evidence of cardiac pathology contributing, no further workup     COVID-19 Education: The signs and symptoms of COVID-19 were discussed with the patient and how to seek care for testing (follow up with PCP or arrange E-visit).  The importance of social distancing was discussed today.  Time:   Today, I have spent 10 minutes with the patient with telehealth technology discussing the above problems.     Medication Adjustments/Labs and Tests Ordered: Current medicines are reviewed at length with the patient today.  Concerns regarding medicines are outlined above.   Tests Ordered: No orders of the defined types were placed in this encounter.   Medication Changes: No orders of the defined types were placed in this encounter.   Disposition:  Follow up as needed  Signed, Carlyle Dolly, MD  12/27/2018 8:09 AM    [place]

## 2018-12-27 NOTE — Progress Notes (Signed)

## 2019-01-20 DIAGNOSIS — I10 Essential (primary) hypertension: Secondary | ICD-10-CM | POA: Diagnosis not present

## 2019-01-20 DIAGNOSIS — Z6825 Body mass index (BMI) 25.0-25.9, adult: Secondary | ICD-10-CM | POA: Diagnosis not present

## 2019-01-20 DIAGNOSIS — E063 Autoimmune thyroiditis: Secondary | ICD-10-CM | POA: Diagnosis not present

## 2019-01-20 DIAGNOSIS — Z1389 Encounter for screening for other disorder: Secondary | ICD-10-CM | POA: Diagnosis not present

## 2019-01-20 DIAGNOSIS — F419 Anxiety disorder, unspecified: Secondary | ICD-10-CM | POA: Diagnosis not present

## 2019-01-20 DIAGNOSIS — Z0001 Encounter for general adult medical examination with abnormal findings: Secondary | ICD-10-CM | POA: Diagnosis not present

## 2019-01-20 DIAGNOSIS — F329 Major depressive disorder, single episode, unspecified: Secondary | ICD-10-CM | POA: Diagnosis not present

## 2019-01-20 DIAGNOSIS — E663 Overweight: Secondary | ICD-10-CM | POA: Diagnosis not present

## 2019-01-20 DIAGNOSIS — G894 Chronic pain syndrome: Secondary | ICD-10-CM | POA: Diagnosis not present

## 2019-01-20 LAB — TSH: TSH: 0.42 (ref 0.41–5.90)

## 2019-01-27 DIAGNOSIS — I498 Other specified cardiac arrhythmias: Secondary | ICD-10-CM | POA: Diagnosis not present

## 2019-01-27 DIAGNOSIS — I499 Cardiac arrhythmia, unspecified: Secondary | ICD-10-CM | POA: Diagnosis not present

## 2019-01-27 DIAGNOSIS — R0600 Dyspnea, unspecified: Secondary | ICD-10-CM | POA: Diagnosis not present

## 2019-01-27 DIAGNOSIS — I429 Cardiomyopathy, unspecified: Secondary | ICD-10-CM | POA: Diagnosis not present

## 2019-01-29 DIAGNOSIS — R0683 Snoring: Secondary | ICD-10-CM | POA: Diagnosis not present

## 2019-01-29 DIAGNOSIS — G478 Other sleep disorders: Secondary | ICD-10-CM | POA: Diagnosis not present

## 2019-01-29 DIAGNOSIS — G4719 Other hypersomnia: Secondary | ICD-10-CM | POA: Diagnosis not present

## 2019-01-31 DIAGNOSIS — E063 Autoimmune thyroiditis: Secondary | ICD-10-CM | POA: Diagnosis not present

## 2019-01-31 LAB — TSH: TSH: 0.31 — AB (ref 0.41–5.90)

## 2019-03-06 IMAGING — DX DG CHEST 2V
2 series · 2 of 2 positions shown · non-contrast
Comparison: 02/03/2013

CLINICAL DATA: Shortness of breath and cough

EXAM:
CHEST  2 VIEW

[chest pa]
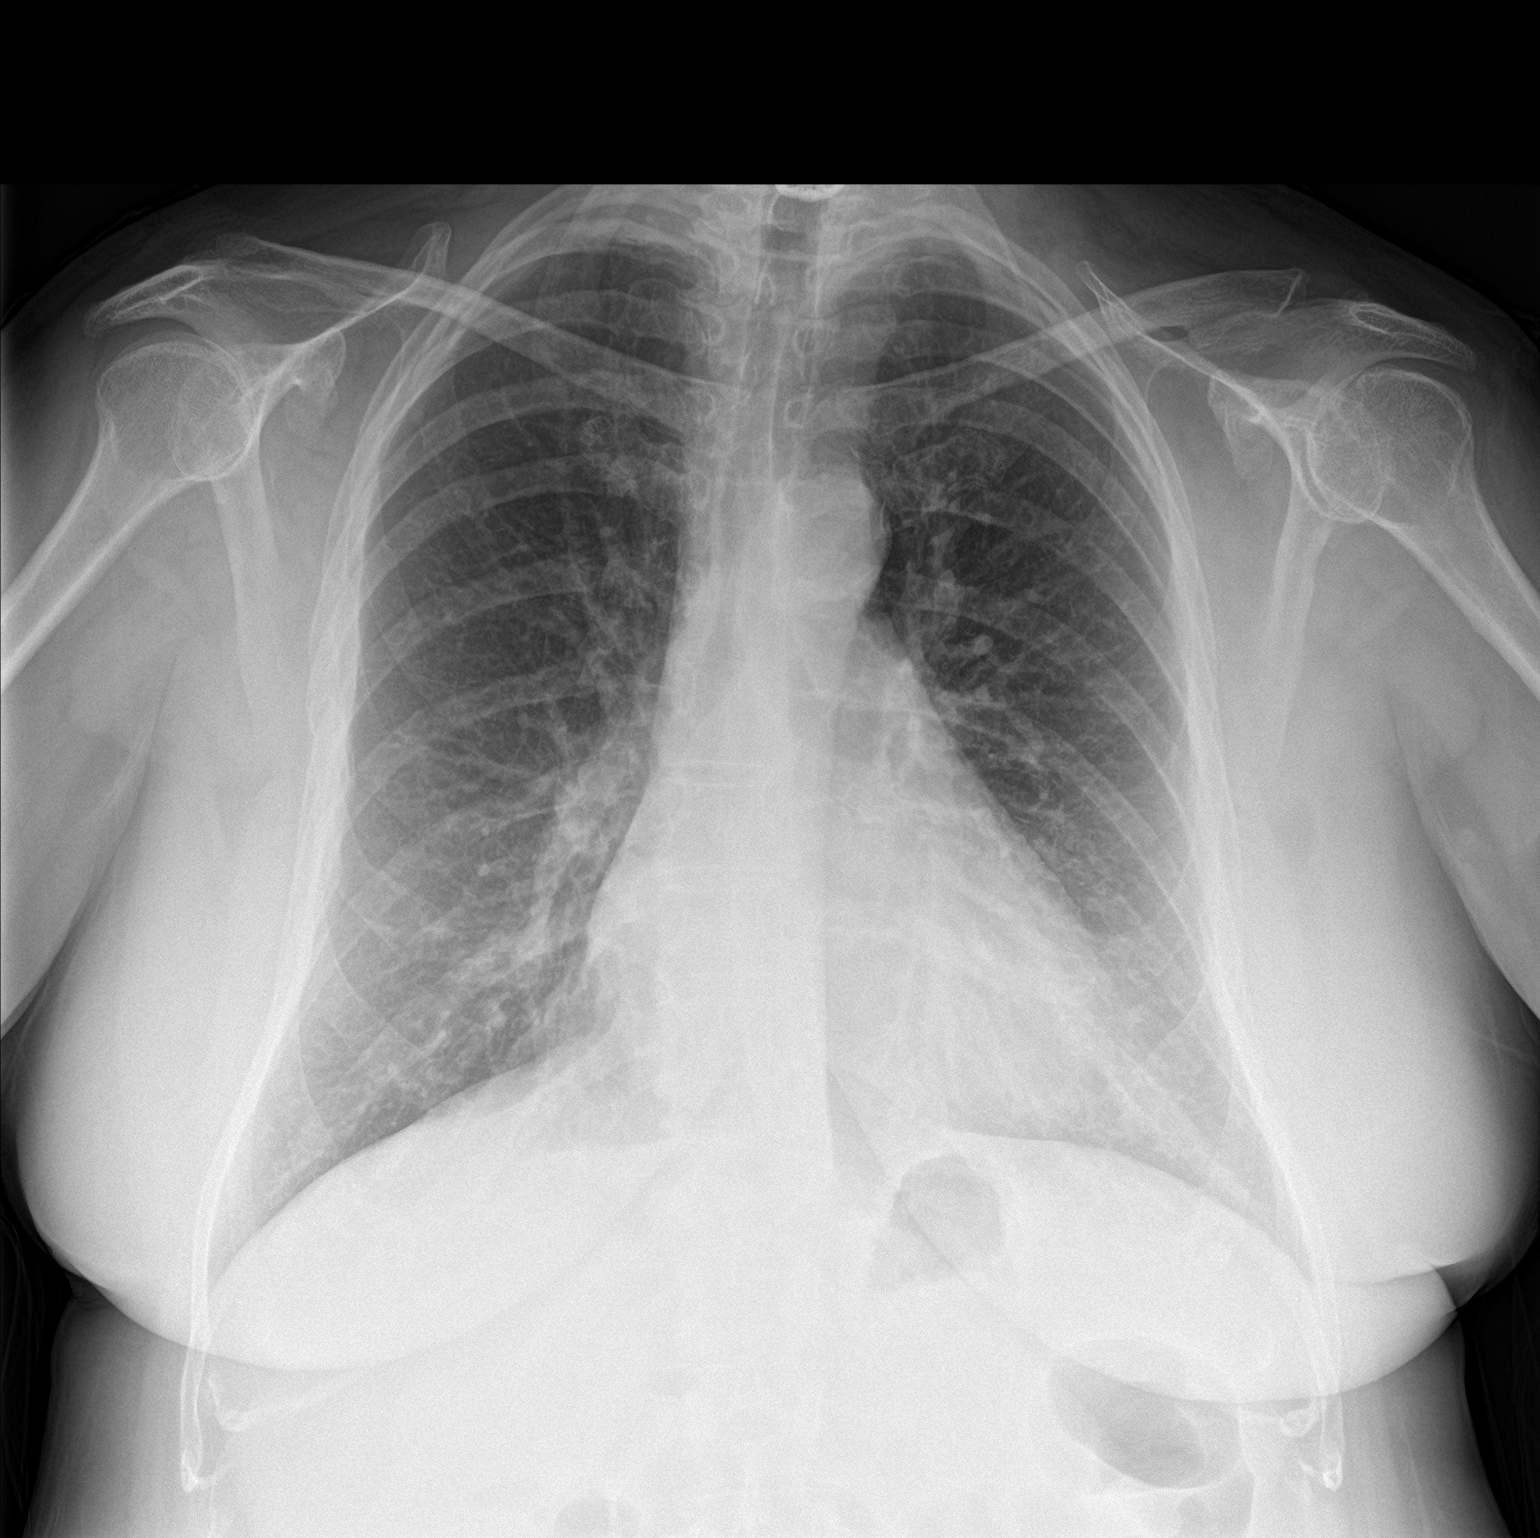

[chest lat]
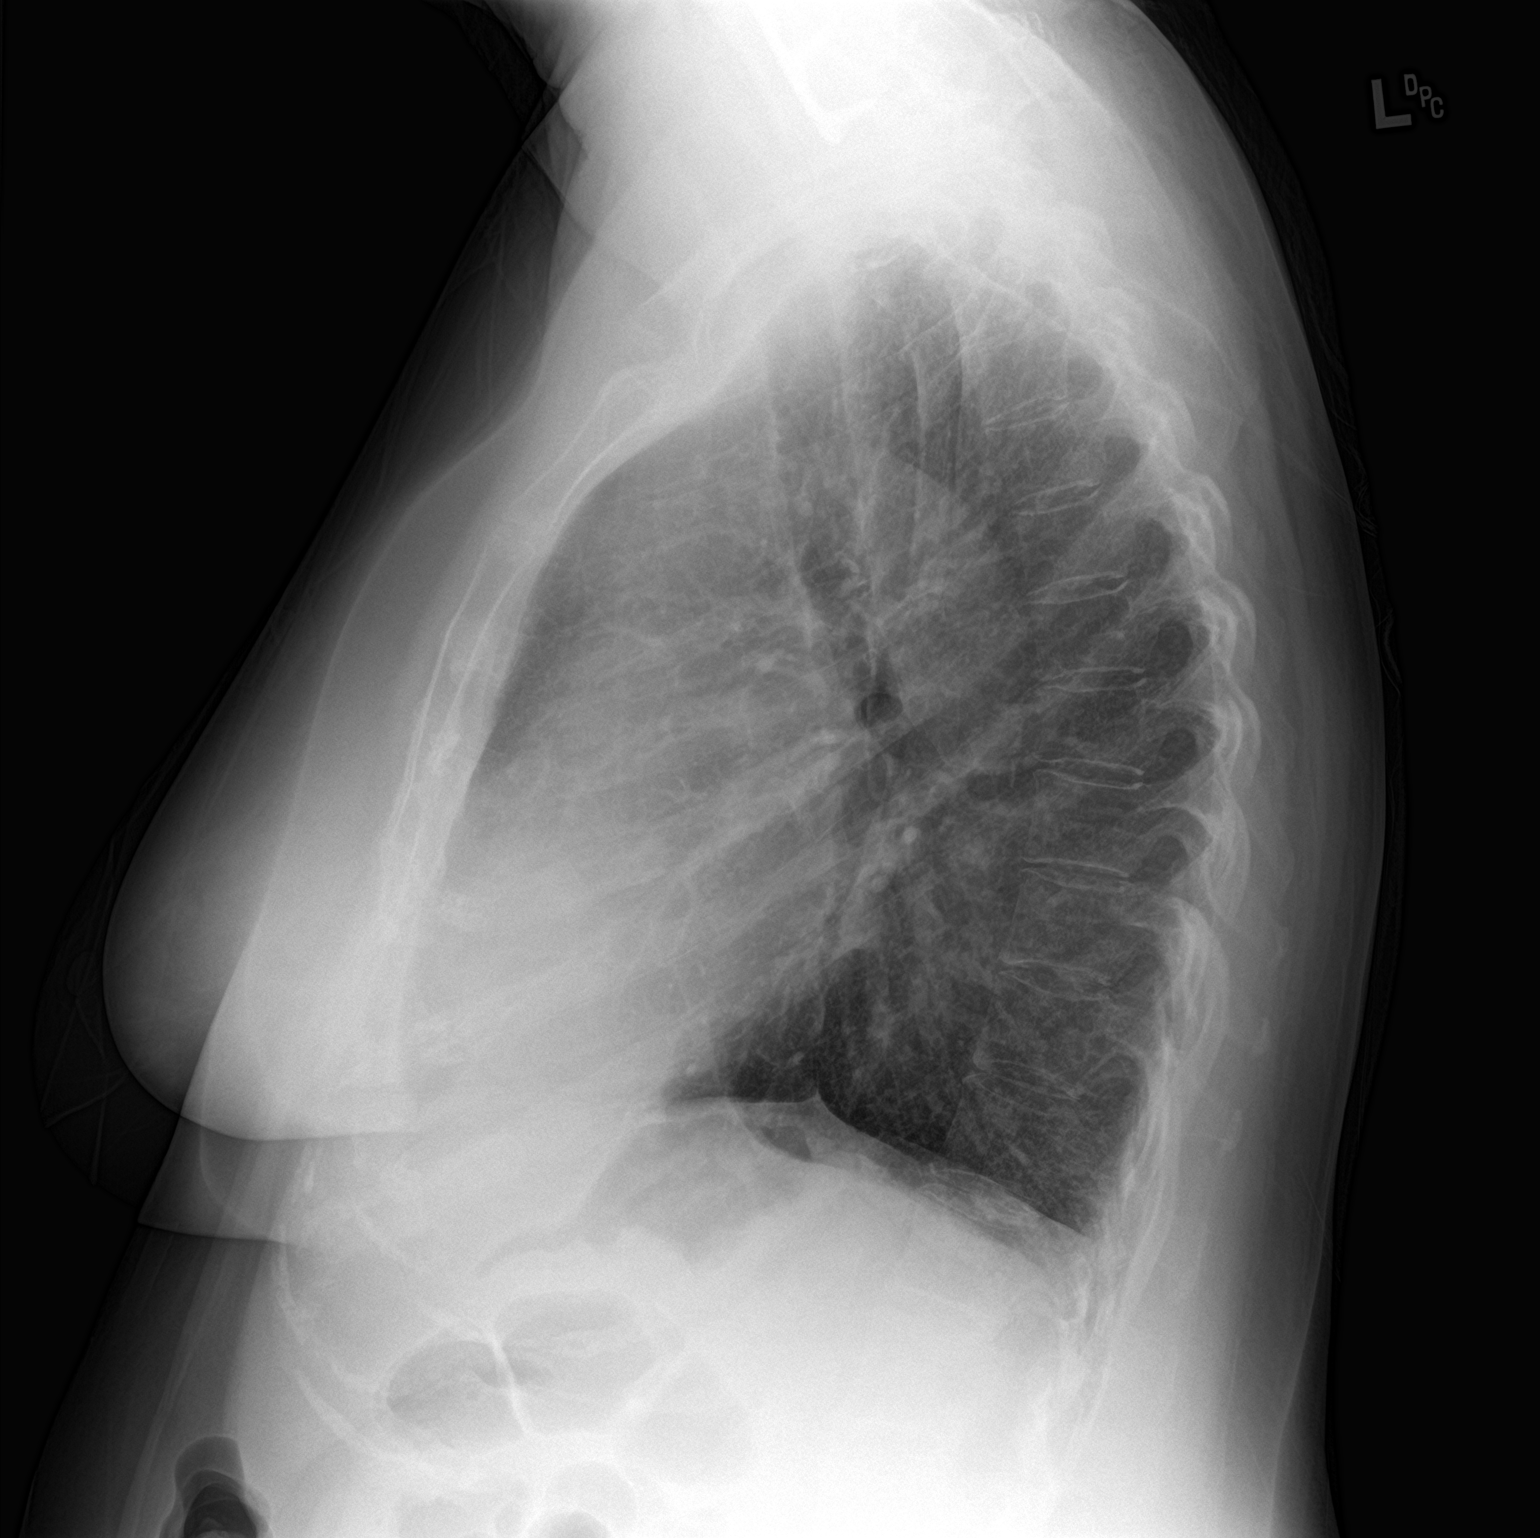

[2 of 2 positions shown; findings below may reference images not displayed]

FINDINGS: Cervical spine hardware. Hyperinflated lungs. No consolidation or
effusion. Prominent interstitial opacities in the lower lung zones.
Normal heart size. No pneumothorax.
IMPRESSION: Hyperinflation. Prominent interstitial opacities at the bilateral
lower lung zones suspicious for bronchial/interstitial inflammation.
No focal pulmonary infiltrate is seen.

## 2019-04-09 DIAGNOSIS — Z23 Encounter for immunization: Secondary | ICD-10-CM | POA: Diagnosis not present

## 2019-04-09 DIAGNOSIS — E663 Overweight: Secondary | ICD-10-CM | POA: Diagnosis not present

## 2019-04-09 DIAGNOSIS — Z6825 Body mass index (BMI) 25.0-25.9, adult: Secondary | ICD-10-CM | POA: Diagnosis not present

## 2019-04-09 DIAGNOSIS — I10 Essential (primary) hypertension: Secondary | ICD-10-CM | POA: Diagnosis not present

## 2019-04-09 DIAGNOSIS — E0590 Thyrotoxicosis, unspecified without thyrotoxic crisis or storm: Secondary | ICD-10-CM | POA: Diagnosis not present

## 2019-05-30 ENCOUNTER — Ambulatory Visit: Payer: Medicare Other | Admitting: "Endocrinology

## 2019-06-25 ENCOUNTER — Ambulatory Visit (INDEPENDENT_AMBULATORY_CARE_PROVIDER_SITE_OTHER): Payer: Medicare Other | Admitting: "Endocrinology

## 2019-06-25 ENCOUNTER — Other Ambulatory Visit: Payer: Self-pay

## 2019-06-25 ENCOUNTER — Encounter: Payer: Self-pay | Admitting: "Endocrinology

## 2019-06-25 VITALS — BP 134/77 | HR 70 | Ht 65.0 in | Wt 146.0 lb

## 2019-06-25 DIAGNOSIS — E0590 Thyrotoxicosis, unspecified without thyrotoxic crisis or storm: Secondary | ICD-10-CM

## 2019-06-25 DIAGNOSIS — E042 Nontoxic multinodular goiter: Secondary | ICD-10-CM | POA: Insufficient documentation

## 2019-06-25 NOTE — Progress Notes (Signed)
Endocrinology Consult Note                                            06/25/2019, 5:34 PM   Subjective:    Patient ID: Katelyn [name], female    DOB: April 22, 1961, PCP Redmond School, MD   Past Medical History:  Diagnosis Date  . Apnea   . Degenerative disc disease, lumbar   . Depression   . GERD (gastroesophageal reflux disease)   . Goiter   . Hemorrhoids   . Hepatitis   . Hepatitis C infection 2004   cured with treatment of interferon and ribavirin  . Hypercholesteremia   . Hypertension   . Knee pain   . Panic attack   . PONV (postoperative nausea and vomiting)   . Seizures ([place])    had 1 seizure of unknown etiology and was put on no meds. has had no more seizures.  . Tubular adenoma 10/14/12  . Urinary tract infection 10/2017  . Varicose veins of both lower extremities with complications    Past Surgical History:  Procedure Laterality Date  . ABDOMINAL AORTOGRAM W/LOWER EXTREMITY N/A 03/28/2017   Procedure: ABDOMINAL AORTOGRAM W/LOWER EXTREMITY;  Surgeon: Waynetta Sandy, MD;  Location: [facility];  Service: Cardiovascular;  Laterality: N/A;  . BACK SURGERY    . BACK SURGERY  2015  . CARDIOPULMONARY EXERCISE TEST  08/26/2012   RER 0.94, Peak VO2 87%, HR 69% predicted, normal VO2 curves  . COLONOSCOPY N/A 10/14/2012   Dr.Rourk-  normal rectum, 3 polyps in the cecum- the largest being approximately 5 mm in diameter. the remainder of the colonic mucosa appeared normal. bx= tubular adenoma  . COLONOSCOPY WITH PROPOFOL N/A 11/05/2017   Procedure: COLONOSCOPY WITH PROPOFOL;  Surgeon: Daneil Dolin, MD;  [facility];  Service: Endoscopy;  Laterality: N/A;  10:00am  . FOOT SURGERY Right 2012  . FOOT SURGERY Right 2018  . HEMORRHOID BANDING    . NECK SURGERY  2005  . POLYPECTOMY  11/05/2017   Procedure: POLYPECTOMY;  Surgeon: Daneil Dolin, MD;  [facility];  Service: Endoscopy;;  splenic flexure polyp hs,  . TONSILLECTOMY   1970   and adenoidectomy  . TOTAL SHOULDER ARTHROPLASTY  2011  . TRANSTHORACIC ECHOCARDIOGRAM  07/2012   EF 55-60%, mod LVH, mod conc LVH, RVSP increased (mild pulm HTN)  . TUBAL LIGATION  1988   Social History   Socioeconomic History  . Marital status: Divorced    Spouse name: Not on file  . Number of children: 2  . Years of education: 34  . Highest education level: Not on file  Occupational History  . Occupation: Designer, industrial/product  Social Needs  . Financial resource strain: Not on file  . Food insecurity    Worry: Not on file    Inability: Not on file  . Transportation needs    Medical: Not on file    Non-medical: Not on file  Tobacco Use  . Smoking status: Former Smoker    Packs/day: 2.00    Years: 35.00    Pack years: 70.00    Types: Cigarettes    Quit date: 07/03/2013    Years since quitting: 5.9  . Smokeless tobacco: Never Used  . Tobacco comment: Quit x one month  Substance and Sexual Activity  . Alcohol  use: Not Currently    Alcohol/week: 0.0 standard drinks    Comment: RARELY  . Drug use: No  . Sexual activity: Never    Birth control/protection: Post-menopausal  Lifestyle  . Physical activity    Days per week: Not on file    Minutes per session: Not on file  . Stress: Not on file  Relationships  . Social Herbalist on phone: Not on file    Gets together: Not on file    Attends religious service: Not on file    Active member of club or organization: Not on file    Attends meetings of clubs or organizations: Not on file    Relationship status: Not on file  Other Topics Concern  . Not on file  Social History Narrative  . Not on file   Family History  Problem Relation Age of Onset  . Scoliosis Mother        also kidney infections  . Osteoarthritis Mother   . Glaucoma Mother   . Lung cancer Father   . Hepatitis C Sister        also OA  . Breast cancer Maternal Grandmother        and lymph cancer  . Kidney cancer Maternal  Grandfather   . ADD / ADHD Child   . ADD / ADHD Child   . Bipolar disorder Child   . Colon cancer Neg Hx    Outpatient Encounter Medications as of 06/25/2019  Medication Sig  . aspirin EC 81 MG tablet Take 81 mg by mouth every evening.   Marland Kitchen atorvastatin (LIPITOR) 40 MG tablet Take 40 mg by mouth daily.  . buprenorphine-naloxone (SUBOXONE) 8-2 mg SUBL SL tablet Place 1 tablet under the tongue 3 (three) times daily.  . enalapril-hydrochlorothiazide (VASERETIC) 10-25 MG tablet   . FLUoxetine (PROZAC) 40 MG capsule Take 40 mg by mouth every evening.   . furosemide (LASIX) 20 MG tablet Take 40 mg by mouth every evening.   . gabapentin (NEURONTIN) 100 MG capsule Take 100 mg by mouth 3 (three) times daily.   Marland Kitchen ibuprofen (ADVIL,MOTRIN) 600 MG tablet Take 1 tablet (600 mg total) by mouth every 6 (six) hours as needed.  . linaclotide (LINZESS) 290 MCG CAPS capsule TAKE (1) CAPSULE BY MOUTH ONCE DAILY IN THE EVENING  . oxybutynin (DITROPAN) 5 MG tablet Take 5 mg by mouth 3 (three) times daily.    No facility-administered encounter medications on file as of 06/25/2019.    ALLERGIES: Allergies  Allergen Reactions  . Ancef [Cefazolin] Swelling    Pt had vague constriction of throat post op. Not sure it was abx. But could be so proceed cautiously with cephalosporins   . Sulfa Antibiotics Nausea And Vomiting and Rash    VACCINATION STATUS: Immunization History  Administered Date(s) Administered  . Influenza,inj,quad, With Preservative 04/29/2018    HPI Katelyn [name] is 58 y.o. female who presents today with a medical history as above. she is being seen in consultation for subclinical hyperthyroidism requested by Redmond School, MD.   Denies any prior history of thyroid dysfunction, however is known to have suppressed TSH for at least a year.  Her last thyroid function test was from January 31, 2019 at which time TSH was 0.31.  She did not have a recent full profile thyroid function test.   She  did have unremarkable thyroid ultrasound in May 2019.  She has what appears to be hyperthyroidism which required radioactive iodine treatment.  She has lost approximately 15 pounds unintentionally, denies  palpitations, tremors, heat/cold intolerance. -She denies dysphagia, shortness of breath, nor voice change.   Review of Systems  Constitutional: + recent weight loss, no fatigue, no subjective hyperthermia, no subjective hypothermia Eyes: no blurry vision, no xerophthalmia ENT: no sore throat, no nodules palpated in throat, no dysphagia/odynophagia, no hoarseness Cardiovascular: no Chest Pain, no Shortness of Breath, no palpitations, no leg swelling Respiratory: no cough, no shortness of breath Gastrointestinal: no Nausea/Vomiting/Diarhhea Musculoskeletal: no muscle/joint aches Skin: no rashes Neurological: no tremors, no numbness, no tingling, no dizziness Psychiatric: no depression, no anxiety  Objective:    BP 134/77   Pulse 70   Ht 5\' 5"  (1.651 m)   Wt 146 lb (66.2 kg)   BMI 24.30 kg/m   Wt Readings from Last 3 Encounters:  06/25/19 146 lb (66.2 kg)  12/27/18 160 lb (72.6 kg)  10/08/18 169 lb (76.7 kg)    Physical Exam  Constitutional:  Body mass index is 24.3 kg/m.,  not in acute distress, normal state of mind Eyes: PERRLA, EOMI, no exophthalmos ENT: moist mucous membranes, no gross thyromegaly, no gross cervical lymphadenopathy Cardiovascular: normal precordial activity, Regular Rate and Rhythm, no Murmur/Rubs/Gallops Respiratory:  adequate breathing efforts, no gross chest deformity, Clear to auscultation bilaterally Gastrointestinal: abdomen soft, Non -tender, No distension, Bowel Sounds present, no gross organomegaly Musculoskeletal: no gross deformities, strength intact in all four extremities Skin: moist, warm, no rashes Neurological: no tremor with outstretched hands, Deep tendon reflexes normal in bilateral lower extremities.  CMP ( most recent) CMP      Component Value Date/Time   NA 142 11/05/2017 0849   K 3.3 (L) 11/05/2017 0849   CL 95 (L) 10/29/2017 1112   CO2 32 10/29/2017 1112   GLUCOSE 97 11/05/2017 0849   BUN 13 10/29/2017 1112   CREATININE 0.59 10/29/2017 1112   CREATININE 0.84 10/17/2013 0001   CALCIUM 8.7 (L) 10/29/2017 1112   PROT 6.9 03/28/2017 0622   ALBUMIN 3.6 03/28/2017 0622   AST 20 03/28/2017 0622   ALT 21 03/28/2017 0622   ALKPHOS 77 03/28/2017 0622   BILITOT 0.2 (L) 03/28/2017 0622   GFRNONAA >60 10/29/2017 1112   GFRAA >60 10/29/2017 1112     Diabetic Labs (most recent): No results found for: HGBA1C   Lipid Panel ( most recent) Lipid Panel     Component Value Date/Time   HDL 55 09/29/2008   LDLCALC 145 09/29/2008      Lab Results  Component Value Date   TSH 0.31 (A) 01/31/2019   TSH 0.42 01/20/2019   TSH 0.503 09/29/2008       Thyroid ultrasound in May 2019: Right lobe 4.9/min, with 0.6 cm nodule in the right lobe.  Left lobe 5.2 cm with 0.9 cm nodule.    Assessment & Plan:   1. Subclinical hyperthyroidism 2.  Multinodular goiter  - Katelyn [name]  is being seen at a kind request of Redmond School, MD. - I have reviewed her available thyroid records and clinically evaluated the patient. - Based on these reviews, she has multinodular goiter with subcentimeter bilateral thyroid nodules, and suppressed TSH on at least 2 occasions in June 2020, and July 2020,  however, she did not have a recent full profile thyroid function test for treatment decision.   -She will be sent to lab for:  - TSH SOLSTAS - T4, free SOL[facility]AS - T3, Free - Thyroid peroxidase antibody SOLSTAS - Thyroglobulin antibody- SOLSTAS  -If  she is found to have hyperthyroidism, she will be considered for thyroid uptake and scan for better characterization of her thyroid function.  This will be discussed with her on the phone visit  in 1 week.  If her thyroid function tests are normal, she will be considered for repeat  thyroid ultrasound and repeat thyroid function test before her next visit in 6 months.   - I did not initiate any new prescriptions today. - she is advised to maintain close follow up with Redmond School, MD for primary care needs.   - Time spent with the patient: 45 minutes, of which >50% was spent in obtaining information about her symptoms, reviewing her previous labs/studies,  evaluations, and treatments, counseling her about her subclinical hypothyroidism, multinodular goiter, and developing a plan to confirm the diagnosis and long term treatment based on the latest standards of care/guidelines.    Katelyn [name] participated in the discussions, expressed understanding, and voiced agreement with the above plans.  All questions were answered to her satisfaction. she is encouraged to contact clinic should she have any questions or concerns prior to her return visit.  Follow up plan: Return in about 1 week (around 07/02/2019) for Labs Today- Non-Fasting Ok.   Glade Lloyd, MD [facility] [address] [place], [place] [address] Phone: [phone]  Fax: [phone]     06/25/2019, 5:34 PM  This note was partially dictated with voice recognition software. Similar sounding words can be transcribed inadequately or may not  be corrected upon review.

## 2019-06-26 LAB — T3, FREE: T3, Free: 3.8 pg/mL (ref 2.3–4.2)

## 2019-06-26 LAB — THYROGLOBULIN ANTIBODY: Thyroglobulin Ab: <1 IU/mL (ref <=1)

## 2019-06-26 LAB — TSH: TSH: 0.64 mIU/L (ref 0.40–4.50)

## 2019-06-26 LAB — THYROID PEROXIDASE ANTIBODY: Thyroperoxidase Ab SerPl-aCnc: <1 IU/mL (ref <9)

## 2019-06-26 LAB — T4, FREE: Free T4: 1.4 ng/dL (ref 0.8–1.8)

## 2019-07-07 ENCOUNTER — Ambulatory Visit: Payer: Medicare Other | Admitting: "Endocrinology

## 2019-07-21 ENCOUNTER — Ambulatory Visit (INDEPENDENT_AMBULATORY_CARE_PROVIDER_SITE_OTHER): Payer: Medicare Other | Admitting: Podiatry

## 2019-07-21 ENCOUNTER — Encounter: Payer: Self-pay | Admitting: Podiatry

## 2019-07-21 ENCOUNTER — Other Ambulatory Visit: Payer: Self-pay

## 2019-07-21 DIAGNOSIS — M6740 Ganglion, unspecified site: Secondary | ICD-10-CM | POA: Diagnosis not present

## 2019-07-21 DIAGNOSIS — M779 Enthesopathy, unspecified: Secondary | ICD-10-CM | POA: Diagnosis not present

## 2019-07-24 NOTE — Progress Notes (Signed)
Subjective:   Patient ID: Katelyn [name], female   DOB: 58 y.o.   MRN: [id number]   HPI Patient presents concerned about forefoot pain bilateral with inflammation around the second joint surface bilateral and is also concerned about a cyst on the right hallux and is concerned about what this may be and whether it should be removed   ROS      Objective:  Physical Exam  Neurovascular status intact with patient found to have 2 separate problems with 1 being inflammatory capsulitis of the second MPJ both feet with pain and also noted to have a cyst at the inner phalangeal joint right big toe freely movable within subcutaneous tissue     Assessment:  Inflammatory capsulitis bilateral along with mucoid cyst right that does not appear to be currently giving her trouble     Plan:  H&P educated her on cyst formation and at this point were just can watch it even though it could be drained at 1 point in future but is not currently hurting.  I did go ahead today and I injected the MPJ bilateral 3 mg Dexasone Kenalog 5 mg Xylocaine and advised on rigid bottom shoes and support therapy along with stretching and will be seen back to recheck

## 2019-08-22 ENCOUNTER — Encounter: Payer: Self-pay | Admitting: Student

## 2019-08-22 ENCOUNTER — Telehealth (INDEPENDENT_AMBULATORY_CARE_PROVIDER_SITE_OTHER): Payer: Medicare Other | Admitting: Student

## 2019-08-22 VITALS — Ht 64.5 in | Wt 130.0 lb

## 2019-08-22 DIAGNOSIS — R55 Syncope and collapse: Secondary | ICD-10-CM

## 2019-08-22 DIAGNOSIS — I10 Essential (primary) hypertension: Secondary | ICD-10-CM

## 2019-08-22 DIAGNOSIS — G4733 Obstructive sleep apnea (adult) (pediatric): Secondary | ICD-10-CM

## 2019-08-22 DIAGNOSIS — E782 Mixed hyperlipidemia: Secondary | ICD-10-CM

## 2019-08-22 NOTE — Patient Instructions (Signed)
Medication Instructions:  Your physician recommends that you continue on your current medications as directed. Please refer to the Current Medication list given to you today.  *If you need a refill on your cardiac medications before your next appointment, please call your pharmacy*  Lab Work: None today If you have labs (blood work) drawn today and your tests are completely normal, you will receive your results only by: Marland Kitchen MyChart Message (if you have MyChart) OR . A paper copy in the mail If you have any lab test that is abnormal or we need to change your treatment, we will call you to review the results.  Testing/Procedures: We have placed a referral with [facility] regarding your Obstructive Sleep Apnea. Their office will call you to schedule an apt. Their contact number is [phone].  Follow-Up: At [facility], you and your health needs are our priority.  As part of our continuing mission to provide you with exceptional heart care, we have created designated Provider Care Teams.  These Care Teams include your primary Cardiologist (physician) and Advanced Practice Providers (APPs -  Physician Assistants and Nurse Practitioners) who all work together to provide you with the care you need, when you need it.    Follow up with Dr.Branch as needed.      Thank you for choosing [facility] !

## 2019-08-22 NOTE — Progress Notes (Signed)
Virtual Visit via Telephone Note   This visit type was conducted due to national recommendations for restrictions regarding the COVID-19 Pandemic (e.g. social distancing) in an effort to limit this patient's exposure and mitigate transmission in our community.  Due to her co-morbid illnesses, this patient is at least at moderate risk for complications without adequate follow up.  This format is felt to be most appropriate for this patient at this time.  The patient did not have access to video technology/had technical difficulties with video requiring transitioning to audio format only (telephone).  All issues noted in this document were discussed and addressed.  No physical exam could be performed with this format.  Please refer to the patient's chart for her  consent to telehealth for [facility].   Date:  08/23/2019   ID:  Katelyn [name], DOB 07-19-1961, MRN [id number]  Patient Location: Home Provider Location: Office  PCP:  Redmond School, MD  Cardiologist:  Carlyle Dolly, MD  Electrophysiologist:  None   Evaluation Performed:  Follow-Up Visit  Chief Complaint:  Sleep Walking  History of Present Illness:    Katelyn [name] is a 60 y.o. female with past medical history of HTN, HLD and falls who presents for a 19-month follow-up telehealth visit.   She most recently had a phone visit in 12/2018 and reported episodes of sleep walking and having associated falls. An echocardiogram had been performed and showed no significant structural abnormalities and an event monitor showed no significant arrhythmias. Was informed to follow-up with a sleep medicine specialist and to follow-up with Cardiology as needed.   In talking with the patient today, she reports having episodes of sleep walking at night and wakes up in different rooms of her home. She does not wake up by walking but says she passes out and wakes up after hitting her head on the floor. This has been occurring for the past  several months. The only medication change she reports is being started on Mirapex by her PCP. She denies any prodromal symptoms. No reported chest pain, palpitations, or dyspnea. She does experience lower extremity edema and says she has been on HCTZ along with Lasix for 8+ years as her edema worsened when only one in the past. She does consume a significant amount of fluids during the day.   She reports having undergone a sleep study by [facility] several months ago but says it was nondiagnostic as she never fell asleep. Says she was informed she did have sleep apnea in the past.  The patient does not have symptoms concerning for COVID-19 infection (fever, chills, cough, or new shortness of breath).    Past Medical History:  Diagnosis Date  . Apnea   . Degenerative disc disease, lumbar   . Depression   . GERD (gastroesophageal reflux disease)   . Goiter   . Hemorrhoids   . Hepatitis   . Hepatitis C infection 2004   cured with treatment of interferon and ribavirin  . Hypercholesteremia   . Hypertension   . Knee pain   . Panic attack   . PONV (postoperative nausea and vomiting)   . Seizures ([place])    had 1 seizure of unknown etiology and was put on no meds. has had no more seizures.  . Tubular adenoma 10/14/12  . Urinary tract infection 10/2017  . Varicose veins of both lower extremities with complications    Past Surgical History:  Procedure Laterality Date  . ABDOMINAL AORTOGRAM W/LOWER EXTREMITY N/A 03/28/2017  Procedure: ABDOMINAL AORTOGRAM W/LOWER EXTREMITY;  Surgeon: Waynetta Sandy, MD;  Location: [facility];  Service: Cardiovascular;  Laterality: N/A;  . BACK SURGERY    . BACK SURGERY  2015  . CARDIOPULMONARY EXERCISE TEST  08/26/2012   RER 0.94, Peak VO2 87%, HR 69% predicted, normal VO2 curves  . COLONOSCOPY N/A 10/14/2012   Dr.Rourk-  normal rectum, 3 polyps in the cecum- the largest being approximately 5 mm in diameter. the remainder of the colonic  mucosa appeared normal. bx= tubular adenoma  . COLONOSCOPY WITH PROPOFOL N/A 11/05/2017   Procedure: COLONOSCOPY WITH PROPOFOL;  Surgeon: Daneil Dolin, MD;  [facility];  Service: Endoscopy;  Laterality: N/A;  10:00am  . FOOT SURGERY Right 2012  . FOOT SURGERY Right 2018  . HEMORRHOID BANDING    . NECK SURGERY  2005  . POLYPECTOMY  11/05/2017   Procedure: POLYPECTOMY;  Surgeon: Daneil Dolin, MD;  [facility];  Service: Endoscopy;;  splenic flexure polyp hs,  . TONSILLECTOMY  1970   and adenoidectomy  . TOTAL SHOULDER ARTHROPLASTY  2011  . TRANSTHORACIC ECHOCARDIOGRAM  07/2012   EF 55-60%, mod LVH, mod conc LVH, RVSP increased (mild pulm HTN)  . TUBAL LIGATION  1988     Current Meds  Medication Sig  . aspirin EC 81 MG tablet Take 81 mg by mouth every evening.   Marland Kitchen atorvastatin (LIPITOR) 40 MG tablet Take 40 mg by mouth daily.  . buprenorphine-naloxone (SUBOXONE) 8-2 mg SUBL SL tablet Place 1 tablet under the tongue 3 (three) times daily.  . enalapril-hydrochlorothiazide (VASERETIC) 10-25 MG tablet Take 1 tablet by mouth daily.   Marland Kitchen FLUoxetine (PROZAC) 40 MG capsule Take 40 mg by mouth every evening.   . furosemide (LASIX) 20 MG tablet Take 40 mg by mouth every evening.   . gabapentin (NEURONTIN) 300 MG capsule Take 300 mg by mouth 3 (three) times daily.  Marland Kitchen ibuprofen (ADVIL,MOTRIN) 600 MG tablet Take 1 tablet (600 mg total) by mouth every 6 (six) hours as needed.  . linaclotide (LINZESS) 290 MCG CAPS capsule TAKE (1) CAPSULE BY MOUTH ONCE DAILY IN THE EVENING  . oxybutynin (DITROPAN) 5 MG tablet Take 5 mg by mouth 3 (three) times daily.   . pramipexole (MIRAPEX) 0.5 MG tablet Take 0.5 mg by mouth 3 (three) times daily.     Allergies:   Ancef [cefazolin] and Sulfa antibiotics   Social History   Tobacco Use  . Smoking status: Former Smoker    Packs/day: 2.00    Years: 35.00    Pack years: 70.00    Types: Cigarettes    Quit date: 07/03/2013    Years  since quitting: 6.1  . Smokeless tobacco: Never Used  . Tobacco comment: Quit x one month  Substance Use Topics  . Alcohol use: Not Currently    Alcohol/week: 0.0 standard drinks    Comment: RARELY  . Drug use: No     Family Hx: The patient's family history includes ADD / ADHD in her child and child; Bipolar disorder in her child; Breast cancer in her maternal grandmother; Glaucoma in her mother; Hepatitis C in her sister; Kidney cancer in her maternal grandfather; Lung cancer in her father; Osteoarthritis in her mother; Scoliosis in her mother. There is no history of Colon cancer.  ROS:   Please see the history of present illness.     All other systems reviewed and are negative.   Prior CV studies:  The following studies were reviewed today:  Echocardiogram: 08/2018 IMPRESSIONS    1. The left ventricle has normal systolic function of 123456. The cavity size is normal. There is no increased left ventricular wall thickness. Echo evidence of impaired relaxation diastolic filling pattern.  2. The right ventricle is normal in size. There is no increase in right ventricular wall thickness. There is normal systolic function. RVSP estimated 33 mmHg.  3. Moderately dilated left atrial size.  4. Normal right atrial size.  5. The mitral valve is normal in structure.  6. The tricuspid valve is normal in structure.  7. The aortic valve is tricuspid in structure.  8. The pulmonic valve is grossly normal.  9. The aortic root is normal in size and structure. 10. No atrial level shunt detected by color flow Doppler.   Cardiac Monitor: 08/2018  21 day event monitor  Min HR 50, Max HR 131, Avg HR 68  Reported symptoms correlate with sinus rhythm  No significant arrhythmias  Labs/Other Tests and Data Reviewed:    EKG:  No ECG reviewed.  Recent Labs: 06/25/2019: TSH 0.64   Recent Lipid Panel Lab Results  Component Value Date/Time   HDL 55 09/29/2008 12:00 AM   LDLCALC 145  09/29/2008 12:00 AM    Wt Readings from Last 3 Encounters:  08/22/19 130 lb (59 kg)  06/25/19 146 lb (66.2 kg)  12/27/18 160 lb (72.6 kg)     Objective:    Vital Signs:  Ht 5' 4.5" (1.638 m)   Wt 130 lb (59 kg)   BMI 21.97 kg/m    General: Pleasant female sounding in NAD Psych: Normal affect. Neuro: Alert and oriented X 3. Lungs:  Resp regular and unlabored while talking on the phone.   ASSESSMENT & PLAN:    1. Syncope while Sleep Walking - she describes episodes of sleep walking and wakes up after she passes out and hits the floor. Prior cardiac workup reassuring as echocardiogram showed no structural abnormalities and event monitor showed no significant arrhythmias.  - she is on both HCTZ and Lasix, reporting having been on both for years. Symptoms do not seem orthostatic in etiology and she reports staying well-hydrated. Given her sleep disturbances and untreated OSA, will refer to Neurology for further input.   2. HTN - she does not have a BP cuff at home but this has been well-controlled at prior office visits. She remains on Enalaparil-HCTZ 10-25mg  daily.  3. HLD - followed by PCP. On Atorvastatin 40mg  daily.   4. OSA - reports having been diagnosed with this in the past but never approved for CPAP. Says she was unable to fall asleep at the time of her most recent sleep study. Refer to Neurology as outlined above.    COVID-19 Education: The signs and symptoms of COVID-19 were discussed with the patient and how to seek care for testing (follow up with PCP or arrange E-visit).  The importance of social distancing was discussed today.  Time:   Today, I have spent 16 minutes with the patient with telehealth technology discussing the above problems.     Medication Adjustments/Labs and Tests Ordered: Current medicines are reviewed at length with the patient today.  Concerns regarding medicines are outlined above.   Tests Ordered: Orders Placed This Encounter    Procedures  . Ambulatory referral to Neurology    Medication Changes: No orders of the defined types were placed in this encounter.   Follow Up: As needed  Signed, [place]  Wynelle Fanny, PA-C  08/23/2019 10:45 AM    [place]

## 2019-08-25 ENCOUNTER — Telehealth: Payer: Self-pay | Admitting: Licensed Clinical Social Worker

## 2019-08-25 NOTE — Telephone Encounter (Signed)
CSW referred to assist patient with obtaining a BP cuff. CSW contacted patient to inform cuff will be delivered to home. Patient grateful for support and assistance. CSW available as needed. Jackie Senia Even, LCSW, CCSW-MCS [phone]  

## 2019-10-13 ENCOUNTER — Other Ambulatory Visit: Payer: Self-pay | Admitting: Gastroenterology

## 2019-10-15 NOTE — Telephone Encounter (Signed)
Please tell the patient I can send in a limited refill but will need f/u OV for further refills as it's been about 2 years since last OV

## 2020-02-09 ENCOUNTER — Telehealth: Payer: Self-pay

## 2020-02-09 NOTE — Telephone Encounter (Signed)
Pt called office and left VM. She has bene recently ill with a fever and would like to discuss her appt tomorrow. Please call.

## 2020-02-10 ENCOUNTER — Ambulatory Visit: Payer: Medicare Other | Admitting: Neurology

## 2020-02-19 ENCOUNTER — Other Ambulatory Visit: Payer: Self-pay | Admitting: Nurse Practitioner

## 2020-04-07 ENCOUNTER — Encounter: Payer: Self-pay | Admitting: *Deleted

## 2020-04-08 ENCOUNTER — Ambulatory Visit: Payer: Self-pay | Admitting: Neurology

## 2020-04-15 ENCOUNTER — Other Ambulatory Visit (HOSPITAL_COMMUNITY): Payer: Self-pay | Admitting: Internal Medicine

## 2020-04-15 DIAGNOSIS — Z1231 Encounter for screening mammogram for malignant neoplasm of breast: Secondary | ICD-10-CM

## 2020-04-22 ENCOUNTER — Other Ambulatory Visit: Payer: Self-pay

## 2020-04-22 ENCOUNTER — Ambulatory Visit (HOSPITAL_COMMUNITY)
Admission: RE | Admit: 2020-04-22 | Discharge: 2020-04-22 | Disposition: A | Discharge status: Home / Self Care | Payer: Medicare Other | Source: Ambulatory Visit | Attending: Internal Medicine | Admitting: Internal Medicine

## 2020-04-22 DIAGNOSIS — Z1231 Encounter for screening mammogram for malignant neoplasm of breast: Secondary | ICD-10-CM

## 2020-09-09 ENCOUNTER — Ambulatory Visit: Payer: Medicare Other | Admitting: Neurology

## 2020-09-09 ENCOUNTER — Other Ambulatory Visit: Payer: Self-pay | Admitting: *Deleted

## 2020-09-09 ENCOUNTER — Encounter: Payer: Self-pay | Admitting: *Deleted

## 2020-09-10 ENCOUNTER — Encounter: Payer: Self-pay | Admitting: Neurology

## 2020-09-10 ENCOUNTER — Ambulatory Visit: Payer: Medicare Other | Admitting: Neurology

## 2020-12-15 ENCOUNTER — Other Ambulatory Visit: Payer: Self-pay | Admitting: Nurse Practitioner

## 2021-06-22 ENCOUNTER — Other Ambulatory Visit: Payer: Self-pay | Admitting: Nurse Practitioner

## 2021-09-02 DIAGNOSIS — R32 Unspecified urinary incontinence: Secondary | ICD-10-CM | POA: Diagnosis not present

## 2021-09-02 DIAGNOSIS — R69 Illness, unspecified: Secondary | ICD-10-CM | POA: Diagnosis not present

## 2021-09-02 DIAGNOSIS — E785 Hyperlipidemia, unspecified: Secondary | ICD-10-CM | POA: Diagnosis not present

## 2021-09-02 DIAGNOSIS — G8929 Other chronic pain: Secondary | ICD-10-CM | POA: Diagnosis not present

## 2021-09-02 DIAGNOSIS — M549 Dorsalgia, unspecified: Secondary | ICD-10-CM | POA: Diagnosis not present

## 2021-09-02 DIAGNOSIS — Z008 Encounter for other general examination: Secondary | ICD-10-CM | POA: Diagnosis not present

## 2021-09-02 DIAGNOSIS — E876 Hypokalemia: Secondary | ICD-10-CM | POA: Diagnosis not present

## 2021-09-02 DIAGNOSIS — I739 Peripheral vascular disease, unspecified: Secondary | ICD-10-CM | POA: Diagnosis not present

## 2021-09-02 DIAGNOSIS — K581 Irritable bowel syndrome with constipation: Secondary | ICD-10-CM | POA: Diagnosis not present

## 2021-09-02 DIAGNOSIS — I10 Essential (primary) hypertension: Secondary | ICD-10-CM | POA: Diagnosis not present

## 2021-09-23 DIAGNOSIS — F1113 Opioid abuse with withdrawal: Secondary | ICD-10-CM | POA: Diagnosis not present

## 2021-09-23 DIAGNOSIS — F1110 Opioid abuse, uncomplicated: Secondary | ICD-10-CM | POA: Diagnosis not present

## 2021-09-23 DIAGNOSIS — R69 Illness, unspecified: Secondary | ICD-10-CM | POA: Diagnosis not present

## 2021-09-23 DIAGNOSIS — F1910 Other psychoactive substance abuse, uncomplicated: Secondary | ICD-10-CM | POA: Diagnosis not present

## 2021-09-30 ENCOUNTER — Ambulatory Visit (INDEPENDENT_AMBULATORY_CARE_PROVIDER_SITE_OTHER): Payer: Medicare HMO

## 2021-09-30 ENCOUNTER — Encounter: Payer: Self-pay | Admitting: Podiatry

## 2021-09-30 ENCOUNTER — Ambulatory Visit (INDEPENDENT_AMBULATORY_CARE_PROVIDER_SITE_OTHER): Payer: Medicare HMO | Admitting: Podiatry

## 2021-09-30 ENCOUNTER — Other Ambulatory Visit: Payer: Self-pay

## 2021-09-30 DIAGNOSIS — M7752 Other enthesopathy of left foot: Secondary | ICD-10-CM

## 2021-09-30 DIAGNOSIS — M79672 Pain in left foot: Secondary | ICD-10-CM

## 2021-09-30 DIAGNOSIS — M779 Enthesopathy, unspecified: Secondary | ICD-10-CM

## 2021-09-30 DIAGNOSIS — M2022 Hallux rigidus, left foot: Secondary | ICD-10-CM

## 2021-09-30 DIAGNOSIS — M79671 Pain in right foot: Secondary | ICD-10-CM

## 2021-09-30 MED ORDER — TRIAMCINOLONE ACETONIDE 10 MG/ML IJ SUSP
10.0000 mg | Freq: Once | INTRAMUSCULAR | Status: AC
  Administered 2021-09-30: 10 mg

## 2021-10-02 NOTE — Progress Notes (Signed)
Subjective:  ? ?Patient ID: Katelyn [name], female   DOB: 61 y.o.   MRN: [id number]  ? ?HPI ?Patient presents with several different problems with 1 being acute discomfort in the forefoot left with inflammation fluid buildup and secondarily reduction of motion of the big toe joint left which at times can be bothersome for her and make it hard to be ambulatory ? ? ?ROS ? ? ?   ?Objective:  ?Physical Exam  ?Neurovascular status was found to be intact with patient found to have acute inflammation fluid buildup within the second metatarsal phalangeal joint left with pain with palpation and also noted to have secondarily discomfort with reduced range of motion of the big toe joint left with what appears to be dorsal spur formation ? ?   ?Assessment:  ?Inflammatory capsulitis acute in nature second MPJ left along with probability for hallux limitus deformity left with pain ? ?   ?Plan:  ?H&P reviewed condition and discussed both conditions.  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 ?X-rays indicate spur formation around the first MPJ with changes within the joint surface and no signs of stress fracture arthritis around the second MPJ ?   ? ? ?

## 2021-10-05 ENCOUNTER — Other Ambulatory Visit: Payer: Self-pay | Admitting: Podiatry

## 2021-10-05 DIAGNOSIS — M779 Enthesopathy, unspecified: Secondary | ICD-10-CM

## 2021-10-17 DIAGNOSIS — R7309 Other abnormal glucose: Secondary | ICD-10-CM | POA: Diagnosis not present

## 2021-10-17 DIAGNOSIS — M1991 Primary osteoarthritis, unspecified site: Secondary | ICD-10-CM | POA: Diagnosis not present

## 2021-10-17 DIAGNOSIS — Z1331 Encounter for screening for depression: Secondary | ICD-10-CM | POA: Diagnosis not present

## 2021-10-17 DIAGNOSIS — E063 Autoimmune thyroiditis: Secondary | ICD-10-CM | POA: Diagnosis not present

## 2021-10-17 DIAGNOSIS — I10 Essential (primary) hypertension: Secondary | ICD-10-CM | POA: Diagnosis not present

## 2021-10-17 DIAGNOSIS — Z6827 Body mass index (BMI) 27.0-27.9, adult: Secondary | ICD-10-CM | POA: Diagnosis not present

## 2021-10-17 DIAGNOSIS — Z0001 Encounter for general adult medical examination with abnormal findings: Secondary | ICD-10-CM | POA: Diagnosis not present

## 2021-10-22 DIAGNOSIS — F1113 Opioid abuse with withdrawal: Secondary | ICD-10-CM | POA: Diagnosis not present

## 2021-10-22 DIAGNOSIS — R69 Illness, unspecified: Secondary | ICD-10-CM | POA: Diagnosis not present

## 2021-10-22 DIAGNOSIS — F1110 Opioid abuse, uncomplicated: Secondary | ICD-10-CM | POA: Diagnosis not present

## 2021-10-22 DIAGNOSIS — F1910 Other psychoactive substance abuse, uncomplicated: Secondary | ICD-10-CM | POA: Diagnosis not present

## 2021-10-22 DIAGNOSIS — F1129 Opioid dependence with unspecified opioid-induced disorder: Secondary | ICD-10-CM | POA: Diagnosis not present

## 2021-11-03 DIAGNOSIS — G4730 Sleep apnea, unspecified: Secondary | ICD-10-CM | POA: Diagnosis not present

## 2021-11-19 DIAGNOSIS — F1129 Opioid dependence with unspecified opioid-induced disorder: Secondary | ICD-10-CM | POA: Diagnosis not present

## 2021-11-19 DIAGNOSIS — R69 Illness, unspecified: Secondary | ICD-10-CM | POA: Diagnosis not present

## 2021-11-19 DIAGNOSIS — Z9189 Other specified personal risk factors, not elsewhere classified: Secondary | ICD-10-CM | POA: Diagnosis not present

## 2021-11-19 DIAGNOSIS — F1113 Opioid abuse with withdrawal: Secondary | ICD-10-CM | POA: Diagnosis not present

## 2021-11-19 DIAGNOSIS — F1910 Other psychoactive substance abuse, uncomplicated: Secondary | ICD-10-CM | POA: Diagnosis not present

## 2021-11-19 DIAGNOSIS — F1110 Opioid abuse, uncomplicated: Secondary | ICD-10-CM | POA: Diagnosis not present

## 2021-12-17 DIAGNOSIS — I10 Essential (primary) hypertension: Secondary | ICD-10-CM | POA: Diagnosis not present

## 2021-12-17 DIAGNOSIS — F1910 Other psychoactive substance abuse, uncomplicated: Secondary | ICD-10-CM | POA: Diagnosis not present

## 2021-12-17 DIAGNOSIS — Z9189 Other specified personal risk factors, not elsewhere classified: Secondary | ICD-10-CM | POA: Diagnosis not present

## 2021-12-17 DIAGNOSIS — F1129 Opioid dependence with unspecified opioid-induced disorder: Secondary | ICD-10-CM | POA: Diagnosis not present

## 2021-12-17 DIAGNOSIS — R69 Illness, unspecified: Secondary | ICD-10-CM | POA: Diagnosis not present

## 2021-12-17 DIAGNOSIS — F1110 Opioid abuse, uncomplicated: Secondary | ICD-10-CM | POA: Diagnosis not present

## 2021-12-17 DIAGNOSIS — F1113 Opioid abuse with withdrawal: Secondary | ICD-10-CM | POA: Diagnosis not present

## 2022-01-30 DIAGNOSIS — E669 Obesity, unspecified: Secondary | ICD-10-CM | POA: Diagnosis not present

## 2022-01-30 DIAGNOSIS — I10 Essential (primary) hypertension: Secondary | ICD-10-CM | POA: Diagnosis not present

## 2022-01-30 DIAGNOSIS — Z6828 Body mass index (BMI) 28.0-28.9, adult: Secondary | ICD-10-CM | POA: Diagnosis not present

## 2022-05-15 ENCOUNTER — Other Ambulatory Visit (HOSPITAL_COMMUNITY): Payer: Self-pay | Admitting: Internal Medicine

## 2022-05-15 DIAGNOSIS — E559 Vitamin D deficiency, unspecified: Secondary | ICD-10-CM | POA: Diagnosis not present

## 2022-05-15 DIAGNOSIS — M4004 Postural kyphosis, thoracic region: Secondary | ICD-10-CM | POA: Diagnosis not present

## 2022-05-15 DIAGNOSIS — E2839 Other primary ovarian failure: Secondary | ICD-10-CM

## 2022-05-15 DIAGNOSIS — I10 Essential (primary) hypertension: Secondary | ICD-10-CM | POA: Diagnosis not present

## 2022-05-15 DIAGNOSIS — M1991 Primary osteoarthritis, unspecified site: Secondary | ICD-10-CM | POA: Diagnosis not present

## 2022-05-15 DIAGNOSIS — R69 Illness, unspecified: Secondary | ICD-10-CM | POA: Diagnosis not present

## 2022-05-15 DIAGNOSIS — Z6824 Body mass index (BMI) 24.0-24.9, adult: Secondary | ICD-10-CM | POA: Diagnosis not present

## 2022-05-15 DIAGNOSIS — M546 Pain in thoracic spine: Secondary | ICD-10-CM

## 2022-05-15 DIAGNOSIS — G894 Chronic pain syndrome: Secondary | ICD-10-CM | POA: Diagnosis not present

## 2022-07-10 ENCOUNTER — Other Ambulatory Visit (HOSPITAL_COMMUNITY): Payer: Self-pay | Admitting: Internal Medicine

## 2022-07-10 DIAGNOSIS — M546 Pain in thoracic spine: Secondary | ICD-10-CM

## 2022-07-10 DIAGNOSIS — E2839 Other primary ovarian failure: Secondary | ICD-10-CM

## 2022-07-26 ENCOUNTER — Other Ambulatory Visit (HOSPITAL_COMMUNITY): Payer: Medicaid Other

## 2022-08-16 ENCOUNTER — Other Ambulatory Visit (HOSPITAL_COMMUNITY): Payer: Medicare HMO

## 2022-08-18 ENCOUNTER — Other Ambulatory Visit (HOSPITAL_COMMUNITY): Payer: Medicare HMO

## 2022-08-25 ENCOUNTER — Other Ambulatory Visit (HOSPITAL_COMMUNITY): Payer: Medicare HMO

## 2022-08-30 ENCOUNTER — Other Ambulatory Visit (HOSPITAL_COMMUNITY): Payer: Medicare HMO

## 2022-10-02 ENCOUNTER — Encounter: Payer: Self-pay | Admitting: *Deleted

## 2022-12-05 DIAGNOSIS — D518 Other vitamin B12 deficiency anemias: Secondary | ICD-10-CM | POA: Diagnosis not present

## 2022-12-05 DIAGNOSIS — Z6826 Body mass index (BMI) 26.0-26.9, adult: Secondary | ICD-10-CM | POA: Diagnosis not present

## 2022-12-05 DIAGNOSIS — M4004 Postural kyphosis, thoracic region: Secondary | ICD-10-CM | POA: Diagnosis not present

## 2022-12-05 DIAGNOSIS — Z0001 Encounter for general adult medical examination with abnormal findings: Secondary | ICD-10-CM | POA: Diagnosis not present

## 2022-12-05 DIAGNOSIS — E559 Vitamin D deficiency, unspecified: Secondary | ICD-10-CM | POA: Diagnosis not present

## 2022-12-05 DIAGNOSIS — Z1331 Encounter for screening for depression: Secondary | ICD-10-CM | POA: Diagnosis not present

## 2022-12-05 DIAGNOSIS — M5136 Other intervertebral disc degeneration, lumbar region: Secondary | ICD-10-CM | POA: Diagnosis not present

## 2022-12-05 DIAGNOSIS — G894 Chronic pain syndrome: Secondary | ICD-10-CM | POA: Diagnosis not present

## 2022-12-05 DIAGNOSIS — G9332 Myalgic encephalomyelitis/chronic fatigue syndrome: Secondary | ICD-10-CM | POA: Diagnosis not present

## 2022-12-05 DIAGNOSIS — M1991 Primary osteoarthritis, unspecified site: Secondary | ICD-10-CM | POA: Diagnosis not present

## 2022-12-05 DIAGNOSIS — E663 Overweight: Secondary | ICD-10-CM | POA: Diagnosis not present

## 2022-12-05 DIAGNOSIS — F419 Anxiety disorder, unspecified: Secondary | ICD-10-CM | POA: Diagnosis not present

## 2022-12-05 DIAGNOSIS — I10 Essential (primary) hypertension: Secondary | ICD-10-CM | POA: Diagnosis not present

## 2022-12-05 DIAGNOSIS — R7309 Other abnormal glucose: Secondary | ICD-10-CM | POA: Diagnosis not present

## 2022-12-21 ENCOUNTER — Other Ambulatory Visit: Payer: Self-pay | Admitting: Internal Medicine

## 2022-12-21 DIAGNOSIS — Z1231 Encounter for screening mammogram for malignant neoplasm of breast: Secondary | ICD-10-CM

## 2023-04-16 ENCOUNTER — Other Ambulatory Visit (HOSPITAL_COMMUNITY): Payer: Self-pay | Admitting: Internal Medicine

## 2023-04-16 DIAGNOSIS — M546 Pain in thoracic spine: Secondary | ICD-10-CM | POA: Diagnosis not present

## 2023-04-16 DIAGNOSIS — Z0100 Encounter for examination of eyes and vision without abnormal findings: Secondary | ICD-10-CM | POA: Diagnosis not present

## 2023-04-16 DIAGNOSIS — M4004 Postural kyphosis, thoracic region: Secondary | ICD-10-CM | POA: Diagnosis not present

## 2023-04-16 DIAGNOSIS — M1991 Primary osteoarthritis, unspecified site: Secondary | ICD-10-CM | POA: Diagnosis not present

## 2023-04-16 DIAGNOSIS — F419 Anxiety disorder, unspecified: Secondary | ICD-10-CM | POA: Diagnosis not present

## 2023-04-16 DIAGNOSIS — R52 Pain, unspecified: Secondary | ICD-10-CM

## 2023-04-16 DIAGNOSIS — M5136 Other intervertebral disc degeneration, lumbar region: Secondary | ICD-10-CM | POA: Diagnosis not present

## 2023-04-16 DIAGNOSIS — H5213 Myopia, bilateral: Secondary | ICD-10-CM | POA: Diagnosis not present

## 2023-04-16 DIAGNOSIS — Z6826 Body mass index (BMI) 26.0-26.9, adult: Secondary | ICD-10-CM | POA: Diagnosis not present

## 2023-04-16 DIAGNOSIS — G894 Chronic pain syndrome: Secondary | ICD-10-CM | POA: Diagnosis not present

## 2023-05-11 ENCOUNTER — Ambulatory Visit (HOSPITAL_COMMUNITY)
Admission: RE | Admit: 2023-05-11 | Discharge: 2023-05-11 | Disposition: A | Discharge status: Home / Self Care | Payer: Medicare HMO | Source: Ambulatory Visit | Attending: Internal Medicine | Admitting: Internal Medicine

## 2023-05-11 ENCOUNTER — Other Ambulatory Visit: Payer: Self-pay | Admitting: Internal Medicine

## 2023-05-11 DIAGNOSIS — M5134 Other intervertebral disc degeneration, thoracic region: Secondary | ICD-10-CM | POA: Diagnosis not present

## 2023-05-11 DIAGNOSIS — Z6827 Body mass index (BMI) 27.0-27.9, adult: Secondary | ICD-10-CM | POA: Diagnosis not present

## 2023-05-11 DIAGNOSIS — R52 Pain, unspecified: Secondary | ICD-10-CM | POA: Diagnosis not present

## 2023-05-11 DIAGNOSIS — M549 Dorsalgia, unspecified: Secondary | ICD-10-CM | POA: Diagnosis not present

## 2023-05-11 DIAGNOSIS — M4004 Postural kyphosis, thoracic region: Secondary | ICD-10-CM | POA: Diagnosis not present

## 2023-05-11 DIAGNOSIS — R600 Localized edema: Secondary | ICD-10-CM | POA: Diagnosis not present

## 2023-05-11 DIAGNOSIS — M1991 Primary osteoarthritis, unspecified site: Secondary | ICD-10-CM | POA: Diagnosis not present

## 2023-05-11 DIAGNOSIS — G894 Chronic pain syndrome: Secondary | ICD-10-CM | POA: Diagnosis not present

## 2023-05-11 DIAGNOSIS — M546 Pain in thoracic spine: Secondary | ICD-10-CM | POA: Diagnosis not present

## 2023-05-11 DIAGNOSIS — L03116 Cellulitis of left lower limb: Secondary | ICD-10-CM | POA: Diagnosis not present

## 2023-05-11 DIAGNOSIS — M79605 Pain in left leg: Secondary | ICD-10-CM

## 2023-05-11 DIAGNOSIS — R490 Dysphonia: Secondary | ICD-10-CM | POA: Diagnosis not present

## 2023-05-11 DIAGNOSIS — I872 Venous insufficiency (chronic) (peripheral): Secondary | ICD-10-CM | POA: Diagnosis not present

## 2023-05-11 DIAGNOSIS — I10 Essential (primary) hypertension: Secondary | ICD-10-CM | POA: Diagnosis not present

## 2023-05-11 DIAGNOSIS — E663 Overweight: Secondary | ICD-10-CM | POA: Diagnosis not present

## 2023-06-12 DIAGNOSIS — R490 Dysphonia: Secondary | ICD-10-CM | POA: Diagnosis not present

## 2023-06-12 DIAGNOSIS — J328 Other chronic sinusitis: Secondary | ICD-10-CM | POA: Diagnosis not present

## 2023-08-16 DIAGNOSIS — M545 Low back pain, unspecified: Secondary | ICD-10-CM | POA: Diagnosis not present

## 2023-08-16 DIAGNOSIS — Z6826 Body mass index (BMI) 26.0-26.9, adult: Secondary | ICD-10-CM | POA: Diagnosis not present

## 2023-08-16 DIAGNOSIS — M5134 Other intervertebral disc degeneration, thoracic region: Secondary | ICD-10-CM | POA: Diagnosis not present

## 2023-08-16 DIAGNOSIS — I10 Essential (primary) hypertension: Secondary | ICD-10-CM | POA: Diagnosis not present

## 2023-08-16 DIAGNOSIS — E663 Overweight: Secondary | ICD-10-CM | POA: Diagnosis not present

## 2023-08-16 DIAGNOSIS — I872 Venous insufficiency (chronic) (peripheral): Secondary | ICD-10-CM | POA: Diagnosis not present

## 2023-08-16 DIAGNOSIS — G894 Chronic pain syndrome: Secondary | ICD-10-CM | POA: Diagnosis not present

## 2023-08-16 DIAGNOSIS — M48062 Spinal stenosis, lumbar region with neurogenic claudication: Secondary | ICD-10-CM | POA: Diagnosis not present

## 2023-08-16 DIAGNOSIS — R490 Dysphonia: Secondary | ICD-10-CM | POA: Diagnosis not present

## 2023-09-20 DIAGNOSIS — M5451 Vertebrogenic low back pain: Secondary | ICD-10-CM | POA: Diagnosis not present

## 2023-10-01 DIAGNOSIS — J328 Other chronic sinusitis: Secondary | ICD-10-CM | POA: Diagnosis not present

## 2023-10-01 DIAGNOSIS — R490 Dysphonia: Secondary | ICD-10-CM | POA: Diagnosis not present

## 2023-10-02 DIAGNOSIS — J329 Chronic sinusitis, unspecified: Secondary | ICD-10-CM | POA: Diagnosis not present

## 2023-10-02 DIAGNOSIS — J342 Deviated nasal septum: Secondary | ICD-10-CM | POA: Diagnosis not present

## 2023-10-23 DIAGNOSIS — M48062 Spinal stenosis, lumbar region with neurogenic claudication: Secondary | ICD-10-CM | POA: Diagnosis not present

## 2023-10-23 DIAGNOSIS — M5134 Other intervertebral disc degeneration, thoracic region: Secondary | ICD-10-CM | POA: Diagnosis not present

## 2023-10-23 DIAGNOSIS — I10 Essential (primary) hypertension: Secondary | ICD-10-CM | POA: Diagnosis not present

## 2023-10-23 DIAGNOSIS — K2100 Gastro-esophageal reflux disease with esophagitis, without bleeding: Secondary | ICD-10-CM | POA: Diagnosis not present

## 2023-10-23 DIAGNOSIS — M1991 Primary osteoarthritis, unspecified site: Secondary | ICD-10-CM | POA: Diagnosis not present

## 2023-10-23 DIAGNOSIS — G894 Chronic pain syndrome: Secondary | ICD-10-CM | POA: Diagnosis not present

## 2023-10-30 DIAGNOSIS — K2100 Gastro-esophageal reflux disease with esophagitis, without bleeding: Secondary | ICD-10-CM | POA: Diagnosis not present

## 2023-12-05 DIAGNOSIS — I10 Essential (primary) hypertension: Secondary | ICD-10-CM | POA: Diagnosis not present

## 2023-12-05 DIAGNOSIS — M1991 Primary osteoarthritis, unspecified site: Secondary | ICD-10-CM | POA: Diagnosis not present

## 2023-12-05 DIAGNOSIS — J329 Chronic sinusitis, unspecified: Secondary | ICD-10-CM | POA: Diagnosis not present

## 2023-12-05 DIAGNOSIS — M48062 Spinal stenosis, lumbar region with neurogenic claudication: Secondary | ICD-10-CM | POA: Diagnosis not present

## 2023-12-05 DIAGNOSIS — G894 Chronic pain syndrome: Secondary | ICD-10-CM | POA: Diagnosis not present

## 2024-06-06 ENCOUNTER — Ambulatory Visit: Payer: Self-pay

## 2024-06-06 NOTE — Telephone Encounter (Signed)
 FYI Only or Action Required?: FYI only for provider: going to UC.  Patient was last seen in primary care on Not Established.  Called Nurse Triage reporting Hypertension.  Triage Disposition: See Today in Office (overriding Call PCP Within 24 Hours) - going to UC as is not established  Patient/caregiver understands and will follow disposition?:  Reason for Disposition  Ran out of BP medications  Answer Assessment - Initial Assessment Questions Pt went to UC for bridge fills of medications 2 weeks ago, BP was 208/84 at that time prior to restarting medication. Has an appt at [facility] for sinusitis and earache. Will request bridge until new pt appt.  States UC would not fill her prozac  rx. Pt was provided with [facility] UC information. Did not wish to go to [facility] which is in service on 11/19.  1. BLOOD PRESSURE: What is your blood pressure? Did you take at least two measurements 5 minutes apart?     Unknown, does not check at home 2. ONSET: When did you take your blood pressure?     Chronic 3. HOW: How did you take your blood pressure? (e.g., automatic home BP monitor, visiting nurse)     Does not take at home 4. HISTORY: Do you have a history of high blood pressure?     Yes 5. MEDICINES: Are you taking any medicines for blood pressure? Have you missed any doses recently?     Amlodipine 6. OTHER SYMPTOMS: Do you have any symptoms? (e.g., blurred vision, chest pain, difficulty breathing, headache, weakness)     Headache x 1 month.  Protocols used: Blood Pressure - High-A-AH Copied from CRM #[id number]. Topic: Clinical - Red Word Triage >> Jun 06, 2024 10:55 AM Tinnie BROCKS wrote: Red Word that prompted transfer to Nurse Triage: Wants to be new patient with [facility] for physical, but mentioned she has been without her bp meds for a week and bp was 208/84 and she wants to go to urgent care for possible ear infection, ear/head pain (Be sure to give temp address if  appt before 12/4 [address], [place], [place] [address])

## 2024-06-25 ENCOUNTER — Ambulatory Visit: Payer: Self-pay

## 2024-07-25 ENCOUNTER — Ambulatory Visit: Payer: Self-pay

## 2024-07-25 NOTE — Telephone Encounter (Signed)
 FYI Only or Action Required?: FYI only for provider: appointment scheduled on 2/24.  Patient was last seen in primary care on New Patient.  Called Nurse Triage reporting Appointment.    Triage Disposition: Information or Advice Only Call  Patient/caregiver understands and will follow disposition?: Yes       Copied from CRM #[id number]. Topic: Clinical - Red Word Triage >> Jul 25, 2024  3:37 PM Antwanette L wrote: Red Word that prompted transfer to Nurse Triage: Patient reports she fell and injured her right shoulder, which is causing significant pain. She needs to reschedule her new patient appointment on 1/5. Reason for Disposition  General information question, no triage required and triager able to answer question  Answer Assessment - Initial Assessment Questions 1. REASON FOR CALL: What is the main reason for your call? or How can I best help you?   Patient called in to triage to reschedule NP appointment, she has shoulder pain but is being seen by a different provider for that issue, the same day as her NP appointment. Appt. Rescheduled/no further concerns at this time.  Protocols used: Information Only Call - No Triage-A-AH

## 2024-07-28 ENCOUNTER — Ambulatory Visit

## 2024-09-16 ENCOUNTER — Ambulatory Visit
